# Patient Record
Sex: Male | Born: 1935 | Race: Black or African American | Hispanic: No | Marital: Married | Smoking: Never smoker
Health system: Southern US, Community
[De-identification: ages and names within clinical notes are randomized; demographics above are authoritative.]

## PROBLEM LIST (undated history)

## (undated) DIAGNOSIS — Z8781 Personal history of (healed) traumatic fracture: Secondary | ICD-10-CM

## (undated) DIAGNOSIS — H9190 Unspecified hearing loss, unspecified ear: Secondary | ICD-10-CM

## (undated) DIAGNOSIS — I10 Essential (primary) hypertension: Secondary | ICD-10-CM

## (undated) DIAGNOSIS — E119 Type 2 diabetes mellitus without complications: Secondary | ICD-10-CM

## (undated) DIAGNOSIS — C61 Malignant neoplasm of prostate: Secondary | ICD-10-CM

## (undated) DIAGNOSIS — E785 Hyperlipidemia, unspecified: Secondary | ICD-10-CM

## (undated) HISTORY — DX: Type 2 diabetes mellitus without complications: E11.9

## (undated) HISTORY — DX: Hyperlipidemia, unspecified: E78.5

## (undated) HISTORY — DX: Essential (primary) hypertension: I10

---

## 2009-04-25 HISTORY — PX: COLONOSCOPY: SHX174

## 2010-03-29 ENCOUNTER — Ambulatory Visit: Payer: Self-pay | Admitting: Internal Medicine

## 2010-11-02 ENCOUNTER — Ambulatory Visit: Payer: Self-pay | Admitting: Family Medicine

## 2010-11-23 ENCOUNTER — Ambulatory Visit: Payer: Self-pay | Admitting: Gastroenterology

## 2010-11-24 LAB — PATHOLOGY REPORT

## 2013-09-17 ENCOUNTER — Ambulatory Visit: Payer: Self-pay | Admitting: Physician Assistant

## 2013-09-17 LAB — URINALYSIS, COMPLETE
Bacteria: NEGATIVE
Bilirubin,UR: NEGATIVE
Blood: NEGATIVE
Glucose,UR: NEGATIVE mg/dL (ref 0–75)
Ketone: NEGATIVE
Leukocyte Esterase: NEGATIVE
Nitrite: NEGATIVE
Ph: 6 (ref 4.5–8.0)
Protein: NEGATIVE
RBC,UR: NONE SEEN /HPF (ref 0–5)
Specific Gravity: 1.02 (ref 1.003–1.030)

## 2013-10-29 ENCOUNTER — Ambulatory Visit: Payer: Self-pay | Admitting: Emergency Medicine

## 2014-09-24 ENCOUNTER — Other Ambulatory Visit: Payer: Self-pay | Admitting: Family Medicine

## 2014-10-23 ENCOUNTER — Other Ambulatory Visit: Payer: Self-pay | Admitting: Family Medicine

## 2014-10-23 DIAGNOSIS — E119 Type 2 diabetes mellitus without complications: Secondary | ICD-10-CM

## 2014-10-23 DIAGNOSIS — E785 Hyperlipidemia, unspecified: Secondary | ICD-10-CM

## 2014-10-23 DIAGNOSIS — I10 Essential (primary) hypertension: Secondary | ICD-10-CM

## 2014-10-24 ENCOUNTER — Other Ambulatory Visit: Payer: Self-pay | Admitting: Family Medicine

## 2014-10-24 DIAGNOSIS — I10 Essential (primary) hypertension: Secondary | ICD-10-CM

## 2014-10-30 ENCOUNTER — Encounter: Payer: Self-pay | Admitting: Family Medicine

## 2014-10-30 ENCOUNTER — Ambulatory Visit (INDEPENDENT_AMBULATORY_CARE_PROVIDER_SITE_OTHER): Payer: Commercial Managed Care - HMO | Admitting: Family Medicine

## 2014-10-30 VITALS — BP 130/88 | HR 70 | Ht 74.0 in | Wt 203.0 lb

## 2014-10-30 DIAGNOSIS — I10 Essential (primary) hypertension: Secondary | ICD-10-CM | POA: Diagnosis not present

## 2014-10-30 DIAGNOSIS — Z008 Encounter for other general examination: Secondary | ICD-10-CM

## 2014-10-30 DIAGNOSIS — E119 Type 2 diabetes mellitus without complications: Secondary | ICD-10-CM | POA: Diagnosis not present

## 2014-10-30 DIAGNOSIS — E785 Hyperlipidemia, unspecified: Secondary | ICD-10-CM

## 2014-10-30 LAB — HEMOCCULT GUIAC POC 1CARD (OFFICE): Fecal Occult Blood, POC: NEGATIVE

## 2014-10-30 LAB — POCT URINALYSIS DIPSTICK
Bilirubin, UA: NEGATIVE
Blood, UA: NEGATIVE
Glucose, UA: NEGATIVE
Ketones, UA: NEGATIVE
Leukocytes, UA: NEGATIVE
Nitrite, UA: NEGATIVE
Protein, UA: NEGATIVE
Spec Grav, UA: 1.01
Urobilinogen, UA: 0.2
pH, UA: 6

## 2014-10-30 MED ORDER — LISINOPRIL 2.5 MG PO TABS: ORAL_TABLET | ORAL | Status: DC

## 2014-10-30 MED ORDER — HYDROCHLOROTHIAZIDE 25 MG PO TABS: ORAL_TABLET | ORAL | Status: DC

## 2014-10-30 MED ORDER — SIMVASTATIN 20 MG PO TABS: ORAL_TABLET | ORAL | Status: DC

## 2014-10-30 MED ORDER — METFORMIN HCL 500 MG PO TABS: ORAL_TABLET | ORAL | Status: DC

## 2014-10-30 NOTE — Progress Notes (Signed)
Name: Brandon Dillon.   MRN: [id number]    DOB: 1927/08/02   Date:10/30/2014       Progress Note  Subjective  Chief Complaint  Chief Complaint  Patient presents with  . Hypertension  . Hyperlipidemia  . Diabetes    Hypertension This is a chronic problem. The current episode started more than 1 year ago. The problem is unchanged. The problem is controlled. Pertinent negatives include no anxiety, blurred vision, chest pain, headaches, malaise/fatigue, neck pain, orthopnea, palpitations, peripheral edema, PND, shortness of breath or sweats. Risk factors for coronary artery disease include diabetes mellitus, dyslipidemia and male gender. Past treatments include ACE inhibitors and diuretics. The current treatment provides moderate improvement. There are no compliance problems.  There is no history of angina, kidney disease, CAD/MI, CVA, heart failure, left ventricular hypertrophy, PVD or retinopathy. There is no history of chronic renal disease.  Hyperlipidemia This is a chronic problem. The current episode started more than 1 year ago. The problem is controlled. Recent lipid tests were reviewed and are normal. He has no history of chronic renal disease or obesity. Pertinent negatives include no chest pain, focal sensory loss, focal weakness, leg pain, myalgias or shortness of breath. Current antihyperlipidemic treatment includes statins. The current treatment provides moderate improvement of lipids. Risk factors for coronary artery disease include dyslipidemia, diabetes mellitus, hypertension and male sex.  Diabetes He presents for his follow-up diabetic visit. He has type 2 diabetes mellitus. Pertinent negatives for hypoglycemia include no dizziness, headaches, mood changes, nervousness/anxiousness, pallor, sleepiness, sweats or tremors. Pertinent negatives for diabetes include no blurred vision, no chest pain, no fatigue, no foot paresthesias, no foot ulcerations, no polydipsia, no polyphagia, no  polyuria and no weight loss. There are no hypoglycemic complications. There are no diabetic complications. Pertinent negatives for diabetic complications include no CVA, PVD or retinopathy. There are no known risk factors for coronary artery disease. When asked about current treatments, none were reported. He is compliant with treatment all of the time. He is following a generally healthy diet. He participates in exercise daily. He monitors blood glucose at home 1-2 x per day. There is no change in his home blood glucose trend. His breakfast blood glucose is taken between 7-8 am. His breakfast blood glucose range is generally 70-90 mg/dl. An ACE inhibitor/angiotensin II receptor blocker is being taken. He does not see a podiatrist.Eye exam is not current.    No problem-specific assessment & plan notes found for this encounter.   Past Medical History  Diagnosis Date  . Diabetes mellitus without complication   . Hyperlipidemia   . Hypertension     Past Surgical History  Procedure Laterality Date  . Colonoscopy  2011    normal- Dr?    History reviewed. No pertinent family history.  History   Social History  . Marital Status: Married    Spouse Name: N/A  . Number of Children: N/A  . Years of Education: N/A   Occupational History  . Not on file.   Social History Main Topics  . Smoking status: Never Smoker   . Smokeless tobacco: Not on file  . Alcohol Use: No  . Drug Use: No  . Sexual Activity: Yes   Other Topics Concern  . Not on file   Social History Narrative  . No narrative on file    No Known Allergies   Review of Systems  Constitutional: Negative for fever, chills, weight loss, malaise/fatigue and fatigue.  HENT: Negative for ear discharge,  ear pain and sore throat.   Eyes: Negative for blurred vision.  Respiratory: Negative for cough, sputum production, shortness of breath and wheezing.   Cardiovascular: Negative for chest pain, palpitations, orthopnea, leg  swelling and PND.  Gastrointestinal: Negative for heartburn, nausea, abdominal pain, diarrhea, constipation, blood in stool and melena.  Genitourinary: Negative for dysuria, urgency, frequency and hematuria.  Musculoskeletal: Negative for myalgias, back pain, joint pain and neck pain.  Skin: Negative for pallor and rash.  Neurological: Negative for dizziness, tingling, tremors, sensory change, focal weakness and headaches.  Endo/Heme/Allergies: Negative for environmental allergies, polydipsia and polyphagia. Does not bruise/bleed easily.  Psychiatric/Behavioral: Negative for depression and suicidal ideas. The patient is not nervous/anxious and does not have insomnia.      Objective  Filed Vitals:   10/30/14 1008  BP: 130/88  Pulse: 70  Height: 6\' 2"  (1.88 m)  Weight: 203 lb (92.08 kg)    Physical Exam  Constitutional: He is oriented to person, place, and time and well-developed, well-nourished, and in no distress.  HENT:  Head: Normocephalic.  Right Ear: External ear normal.  Left Ear: External ear normal.  Nose: Nose normal.  Mouth/Throat: Oropharynx is clear and moist.  Eyes: Conjunctivae and EOM are normal. Pupils are equal, round, and reactive to light. Right eye exhibits no discharge. Left eye exhibits no discharge. No scleral icterus.  Neck: Normal range of motion. Neck supple. No JVD present. No tracheal deviation present. No thyromegaly present.  Cardiovascular: Normal rate, regular rhythm, normal heart sounds and intact distal pulses.  Exam reveals no gallop and no friction rub.   No murmur heard. Pulmonary/Chest: Breath sounds normal. No respiratory distress. He has no wheezes. He has no rales.  Abdominal: Soft. Bowel sounds are normal. He exhibits no mass. There is no hepatosplenomegaly. There is no tenderness. There is no rebound, no guarding and no CVA tenderness.  Genitourinary: Rectum normal, prostate normal and penis normal. Guaiac negative stool.  Musculoskeletal:  Normal range of motion. He exhibits no edema or tenderness.  Lymphadenopathy:    He has no cervical adenopathy.  Neurological: He is alert and oriented to person, place, and time. He has normal sensation, normal strength, normal reflexes and intact cranial nerves. No cranial nerve deficit.  Skin: Skin is warm. No rash noted.  Psychiatric: Mood and affect normal.      Assessment & Plan  Problem List Items Addressed This Visit    None    Visit Diagnoses    Essential hypertension    -  Primary    Relevant Medications    aspirin 81 MG tablet    hydrochlorothiazide (HYDRODIURIL) 25 MG tablet    lisinopril (PRINIVIL,ZESTRIL) 2.5 MG tablet    simvastatin (ZOCOR) 20 MG tablet    Other Relevant Orders    Renal Function Panel    POCT Urinalysis Dipstick    Type 2 diabetes mellitus without complication        Relevant Medications    aspirin 81 MG tablet    lisinopril (PRINIVIL,ZESTRIL) 2.5 MG tablet    metFORMIN (GLUCOPHAGE) 500 MG tablet    simvastatin (ZOCOR) 20 MG tablet    Other Relevant Orders    HgB A1c    Hyperlipidemia        Relevant Medications    aspirin 81 MG tablet    hydrochlorothiazide (HYDRODIURIL) 25 MG tablet    lisinopril (PRINIVIL,ZESTRIL) 2.5 MG tablet    simvastatin (ZOCOR) 20 MG tablet    Other Relevant Orders  Lipid Profile    Rectal exam        Relevant Orders    PSA    POCT Occult Blood Stool         Dr. Otilio Miu [facility]  10/30/2014

## 2014-10-31 ENCOUNTER — Other Ambulatory Visit: Payer: Self-pay

## 2014-10-31 DIAGNOSIS — R972 Elevated prostate specific antigen [PSA]: Secondary | ICD-10-CM

## 2014-10-31 LAB — RENAL FUNCTION PANEL
Albumin: 4.3 g/dL (ref 3.5–4.7)
BUN/Creatinine Ratio: 13 (ref 10–22)
BUN: 14 mg/dL (ref 8–27)
CO2: 29 mmol/L (ref 18–29)
Calcium: 9.4 mg/dL (ref 8.6–10.2)
Chloride: 96 mmol/L — ABNORMAL LOW (ref 97–108)
Creatinine, Ser: 1.04 mg/dL (ref 0.76–1.27)
GFR calc Af Amer: 76 mL/min/{1.73_m2} (ref >59)
GFR calc non Af Amer: 66 mL/min/{1.73_m2} (ref >59)
Glucose: 73 mg/dL (ref 65–99)
Phosphorus: 3.4 mg/dL (ref 2.5–4.5)
Potassium: 4.1 mmol/L (ref 3.5–5.2)
Sodium: 139 mmol/L (ref 134–144)

## 2014-10-31 LAB — LIPID PANEL
Chol/HDL Ratio: 3 ratio units (ref 0.0–5.0)
Cholesterol, Total: 192 mg/dL (ref 100–199)
HDL: 63 mg/dL (ref >39)
LDL Calculated: 116 mg/dL — ABNORMAL HIGH (ref 0–99)
Triglycerides: 66 mg/dL (ref 0–149)
VLDL Cholesterol Cal: 13 mg/dL (ref 5–40)

## 2014-10-31 LAB — HEMOGLOBIN A1C
Est. average glucose Bld gHb Est-mCnc: 108 mg/dL
Hgb A1c MFr Bld: 5.4 % (ref 4.8–5.6)

## 2014-10-31 LAB — PSA: Prostate Specific Ag, Serum: 6.6 ng/mL — ABNORMAL HIGH (ref 0.0–4.0)

## 2014-11-05 ENCOUNTER — Other Ambulatory Visit: Payer: Self-pay | Admitting: Family Medicine

## 2014-11-05 DIAGNOSIS — E119 Type 2 diabetes mellitus without complications: Secondary | ICD-10-CM

## 2014-12-04 DIAGNOSIS — R972 Elevated prostate specific antigen [PSA]: Secondary | ICD-10-CM | POA: Insufficient documentation

## 2014-12-07 DIAGNOSIS — N401 Enlarged prostate with lower urinary tract symptoms: Secondary | ICD-10-CM

## 2014-12-07 DIAGNOSIS — N138 Other obstructive and reflux uropathy: Secondary | ICD-10-CM | POA: Insufficient documentation

## 2015-01-27 ENCOUNTER — Other Ambulatory Visit: Payer: Self-pay | Admitting: Family Medicine

## 2015-01-28 ENCOUNTER — Other Ambulatory Visit: Payer: Self-pay | Admitting: Family Medicine

## 2015-01-28 ENCOUNTER — Other Ambulatory Visit: Payer: Self-pay

## 2015-01-28 DIAGNOSIS — E119 Type 2 diabetes mellitus without complications: Secondary | ICD-10-CM

## 2015-01-28 DIAGNOSIS — Z794 Long term (current) use of insulin: Secondary | ICD-10-CM

## 2015-03-30 ENCOUNTER — Ambulatory Visit (INDEPENDENT_AMBULATORY_CARE_PROVIDER_SITE_OTHER): Payer: Commercial Managed Care - HMO | Admitting: Family Medicine

## 2015-03-30 ENCOUNTER — Encounter: Payer: Self-pay | Admitting: Family Medicine

## 2015-03-30 VITALS — BP 130/74 | HR 80 | Ht 73.0 in | Wt 210.0 lb

## 2015-03-30 DIAGNOSIS — I10 Essential (primary) hypertension: Secondary | ICD-10-CM | POA: Diagnosis not present

## 2015-03-30 DIAGNOSIS — T50905A Adverse effect of unspecified drugs, medicaments and biological substances, initial encounter: Secondary | ICD-10-CM

## 2015-03-30 MED ORDER — HYDROCHLOROTHIAZIDE 25 MG PO TABS: ORAL_TABLET | ORAL | Status: DC

## 2015-03-30 NOTE — Progress Notes (Signed)
Name: Brandon Dillon.   MRN: [id number]    DOB: [birth year]   Date:03/30/2015       Progress Note  Subjective  Chief Complaint  Chief Complaint  Patient presents with  . Hypertension    stopped the Lisinopril due to cough- cough has been relieved by stopping the med    Hypertension This is a chronic problem. The current episode started more than 1 year ago. The problem is unchanged. The problem is controlled. Pertinent negatives include no anxiety, blurred vision, chest pain, headaches, malaise/fatigue, neck pain, orthopnea, palpitations, peripheral edema, PND, shortness of breath or sweats. There are no associated agents to hypertension. There are no known risk factors for coronary artery disease. Past treatments include ACE inhibitors and diuretics. The current treatment provides mild improvement. Compliance problems include medication side effects.  There is no history of angina, kidney disease, CAD/MI, CVA, heart failure, left ventricular hypertrophy, PVD, renovascular disease or retinopathy. There is no history of chronic renal disease or a hypertension causing med.    No problem-specific assessment & plan notes found for this encounter.   Past Medical History  Diagnosis Date  . Diabetes mellitus without complication ([place])   . Hyperlipidemia   . Hypertension     Past Surgical History  Procedure Laterality Date  . Colonoscopy  2011    normal- Dr?    History reviewed. No pertinent family history.  Social History   Social History  . Marital Status: Married    Spouse Name: N/A  . Number of Children: N/A  . Years of Education: N/A   Occupational History  . Not on file.   Social History Main Topics  . Smoking status: Never Smoker   . Smokeless tobacco: Not on file  . Alcohol Use: No  . Drug Use: No  . Sexual Activity: Yes   Other Topics Concern  . Not on file   Social History Narrative    Allergies  Allergen Reactions  . Lisinopril     coughing      Review of Systems  Constitutional: Negative for fever, chills, weight loss and malaise/fatigue.  HENT: Negative for ear discharge, ear pain and sore throat.   Eyes: Negative for blurred vision.  Respiratory: Negative for cough, sputum production, shortness of breath and wheezing.   Cardiovascular: Negative for chest pain, palpitations, orthopnea, leg swelling and PND.  Gastrointestinal: Negative for heartburn, nausea, abdominal pain, diarrhea, constipation, blood in stool and melena.  Genitourinary: Negative for dysuria, urgency, frequency and hematuria.  Musculoskeletal: Negative for myalgias, back pain, joint pain and neck pain.  Skin: Negative for rash.  Neurological: Negative for dizziness, tingling, sensory change, focal weakness and headaches.  Endo/Heme/Allergies: Negative for environmental allergies and polydipsia. Does not bruise/bleed easily.  Psychiatric/Behavioral: Negative for depression and suicidal ideas. The patient is not nervous/anxious and does not have insomnia.      Objective  Filed Vitals:   03/30/15 1027  BP: 130/74  Pulse: 80  Height: 6\' 1"  (1.854 m)  Weight: 210 lb (95.255 kg)    Physical Exam  Constitutional: He is oriented to person, place, and time and well-developed, well-nourished, and in no distress.  HENT:  Head: Normocephalic.  Right Ear: External ear normal.  Left Ear: External ear normal.  Nose: Nose normal.  Mouth/Throat: Oropharynx is clear and moist.  Eyes: Conjunctivae and EOM are normal. Pupils are equal, round, and reactive to light. Right eye exhibits no discharge. Left eye exhibits no discharge. No scleral icterus.  Neck:  Normal range of motion. Neck supple. No JVD present. No tracheal deviation present. No thyromegaly present.  Cardiovascular: Normal rate, regular rhythm, normal heart sounds and intact distal pulses.  Exam reveals no gallop and no friction rub.   No murmur heard. Pulmonary/Chest: Breath sounds normal. No  respiratory distress. He has no wheezes. He has no rales.  Abdominal: Soft. Bowel sounds are normal. He exhibits no mass. There is no hepatosplenomegaly. There is no tenderness. There is no rebound, no guarding and no CVA tenderness.  Musculoskeletal: Normal range of motion. He exhibits no edema or tenderness.  Lymphadenopathy:    He has no cervical adenopathy.  Neurological: He is alert and oriented to person, place, and time. He has normal sensation, normal strength, normal reflexes and intact cranial nerves. No cranial nerve deficit.  Skin: Skin is warm. No rash noted.  Psychiatric: Mood and affect normal.  Nursing note and vitals reviewed.     Assessment & Plan  Problem List Items Addressed This Visit    None    Visit Diagnoses    Essential hypertension    -  Primary    Relevant Medications    hydrochlorothiazide (HYDRODIURIL) 25 MG tablet    Medication side effect, initial encounter             Dr. Mac[facility]  03/30/2015

## 2015-05-11 ENCOUNTER — Encounter: Payer: Self-pay | Admitting: Family Medicine

## 2015-05-11 ENCOUNTER — Ambulatory Visit (INDEPENDENT_AMBULATORY_CARE_PROVIDER_SITE_OTHER): Payer: PPO | Admitting: Family Medicine

## 2015-05-11 VITALS — BP 130/62 | HR 72 | Ht 73.0 in | Wt 216.0 lb

## 2015-05-11 DIAGNOSIS — R69 Illness, unspecified: Secondary | ICD-10-CM | POA: Diagnosis not present

## 2015-05-11 DIAGNOSIS — C61 Malignant neoplasm of prostate: Secondary | ICD-10-CM | POA: Insufficient documentation

## 2015-05-11 DIAGNOSIS — N400 Enlarged prostate without lower urinary tract symptoms: Secondary | ICD-10-CM

## 2015-05-11 DIAGNOSIS — E785 Hyperlipidemia, unspecified: Secondary | ICD-10-CM | POA: Insufficient documentation

## 2015-05-11 DIAGNOSIS — I10 Essential (primary) hypertension: Secondary | ICD-10-CM

## 2015-05-11 DIAGNOSIS — E119 Type 2 diabetes mellitus without complications: Secondary | ICD-10-CM | POA: Diagnosis not present

## 2015-05-11 MED ORDER — TAMSULOSIN HCL 0.4 MG PO CAPS: 0.4000 mg | ORAL_CAPSULE | Freq: Every day | ORAL | Status: DC

## 2015-05-11 MED ORDER — METFORMIN HCL 500 MG PO TABS: ORAL_TABLET | ORAL | Status: DC

## 2015-05-11 MED ORDER — HYDROCHLOROTHIAZIDE 25 MG PO TABS: ORAL_TABLET | ORAL | Status: DC

## 2015-05-11 MED ORDER — SIMVASTATIN 20 MG PO TABS: ORAL_TABLET | ORAL | Status: DC

## 2015-05-11 NOTE — Progress Notes (Signed)
Name: Brandon Dillon.   MRN: [id number]    DOB: December 10, 1927   Date:05/11/2015       Progress Note  Subjective  Chief Complaint  Chief Complaint  Patient presents with  . Hyperlipidemia  . Hypertension  . Diabetes  . Benign Prostatic Hypertrophy    Hyperlipidemia This is a chronic problem. The current episode started more than 1 year ago. The problem is controlled. Recent lipid tests were reviewed and are normal. He has no history of chronic renal disease, diabetes, hypothyroidism, liver disease, obesity or nephrotic syndrome. Factors aggravating his hyperlipidemia include fatty foods. Pertinent negatives include no chest pain, focal sensory loss, focal weakness, leg pain, myalgias or shortness of breath. Current antihyperlipidemic treatment includes statins. The current treatment provides moderate improvement of lipids. There are no compliance problems.  Risk factors for coronary artery disease include dyslipidemia.  Hypertension This is a chronic problem. The current episode started more than 1 year ago. The problem has been gradually improving since onset. The problem is controlled. Pertinent negatives include no blurred vision, chest pain, headaches, malaise/fatigue, neck pain, orthopnea, palpitations, PND or shortness of breath. There are no associated agents to hypertension. There are no known risk factors for coronary artery disease. Past treatments include diuretics. The current treatment provides mild improvement. There are no compliance problems.  There is no history of angina, CVA, heart failure, PVD or renovascular disease. There is no history of chronic renal disease or a hypertension causing med.  Diabetes He presents for his follow-up diabetic visit. He has type 2 diabetes mellitus. His disease course has been stable. There are no hypoglycemic associated symptoms. Pertinent negatives for hypoglycemia include no dizziness, headaches or nervousness/anxiousness. Pertinent negatives for  diabetes include no blurred vision, no chest pain, no polydipsia, no polyphagia, no polyuria, no visual change and no weight loss. There are no hypoglycemic complications. Symptoms are stable. Pertinent negatives for diabetic complications include no CVA or PVD. Risk factors for coronary artery disease include diabetes mellitus, dyslipidemia, hypertension and male sex. Current diabetic treatment includes oral agent (monotherapy). An ACE inhibitor/angiotensin II receptor blocker is not being taken. He does not see a podiatrist.Eye exam is not current.  Benign Prostatic Hypertrophy This is a chronic problem. The current episode started more than 1 month ago. The problem has been gradually improving since onset. Irritative symptoms include nocturia. Irritative symptoms do not include frequency or urgency. Obstructive symptoms do not include a weak stream. Pertinent negatives include no chills, dysuria, hematuria or nausea. Past treatments include doxazosin. The treatment provided mild relief.    No problem-specific assessment & plan notes found for this encounter.   Past Medical History  Diagnosis Date  . Diabetes mellitus without complication ([place])   . Hyperlipidemia   . Hypertension     Past Surgical History  Procedure Laterality Date  . Colonoscopy  2011    normal- Dr?    History reviewed. No pertinent family history.  Social History   Social History  . Marital Status: Married    Spouse Name: N/A  . Number of Children: N/A  . Years of Education: N/A   Occupational History  . Not on file.   Social History Main Topics  . Smoking status: Never Smoker   . Smokeless tobacco: Not on file  . Alcohol Use: No  . Drug Use: No  . Sexual Activity: Yes   Other Topics Concern  . Not on file   Social History Narrative    Allergies  Allergen  Reactions  . Lisinopril     coughing     Review of Systems  Constitutional: Negative for fever, chills, weight loss and malaise/fatigue.   HENT: Negative for ear discharge, ear pain and sore throat.   Eyes: Negative for blurred vision.  Respiratory: Negative for cough, sputum production, shortness of breath and wheezing.   Cardiovascular: Negative for chest pain, palpitations, orthopnea, leg swelling and PND.  Gastrointestinal: Negative for heartburn, nausea, abdominal pain, diarrhea, constipation, blood in stool and melena.  Genitourinary: Positive for nocturia. Negative for dysuria, urgency, frequency and hematuria.  Musculoskeletal: Negative for myalgias, back pain, joint pain and neck pain.  Skin: Negative for rash.  Neurological: Negative for dizziness, tingling, sensory change, focal weakness and headaches.  Endo/Heme/Allergies: Negative for environmental allergies, polydipsia and polyphagia. Does not bruise/bleed easily.  Psychiatric/Behavioral: Negative for depression and suicidal ideas. The patient is not nervous/anxious and does not have insomnia.      Objective  Filed Vitals:   05/11/15 0848  BP: 130/62  Pulse: 72  Height: 6\' 1"  (1.854 m)  Weight: 216 lb (97.977 kg)    Physical Exam  Constitutional: He is oriented to person, place, and time and well-developed, well-nourished, and in no distress.  HENT:  Head: Normocephalic.  Right Ear: External ear normal.  Left Ear: External ear normal.  Nose: Nose normal.  Mouth/Throat: Oropharynx is clear and moist.  Eyes: Conjunctivae and EOM are normal. Pupils are equal, round, and reactive to light. Right eye exhibits no discharge. Left eye exhibits no discharge. No scleral icterus.  Neck: Normal range of motion. Neck supple. No JVD present. No tracheal deviation present. No thyromegaly present.  Cardiovascular: Normal rate, regular rhythm, normal heart sounds and intact distal pulses.  Exam reveals no gallop and no friction rub.   No murmur heard. Pulmonary/Chest: Breath sounds normal. No respiratory distress. He has no wheezes. He has no rales.  Abdominal: Soft.  Bowel sounds are normal. He exhibits no mass. There is no hepatosplenomegaly. There is no tenderness. There is no rebound, no guarding and no CVA tenderness.  Genitourinary: Rectum normal and testes/scrotum normal. Prostate is enlarged.  Musculoskeletal: Normal range of motion. He exhibits no edema or tenderness.  Lymphadenopathy:    He has no cervical adenopathy.  Neurological: He is alert and oriented to person, place, and time. He has normal sensation, normal strength, normal reflexes and intact cranial nerves. No cranial nerve deficit.  Skin: Skin is warm. No rash noted.  Psychiatric: Mood and affect normal.  Nursing note and vitals reviewed.     Assessment & Plan  Problem List Items Addressed This Visit      Cardiovascular and Mediastinum   Essential hypertension - Primary   Relevant Medications   hydrochlorothiazide (HYDRODIURIL) 25 MG tablet   simvastatin (ZOCOR) 20 MG tablet   Other Relevant Orders   Renal Function Panel     Endocrine   Type 2 diabetes mellitus without complication, without long-term current use of insulin ([facility])   Relevant Medications   metFORMIN (GLUCOPHAGE) 500 MG tablet   simvastatin (ZOCOR) 20 MG tablet   Other Relevant Orders   HgB A1c     Genitourinary   Primary prostate adenocarcinoma ([facility])   Prostate hypertrophy   Relevant Medications   tamsulosin (FLOMAX) 0.4 MG CAPS capsule     Other   Hyperlipidemia   Relevant Medications   hydrochlorothiazide (HYDRODIURIL) 25 MG tablet   simvastatin (ZOCOR) 20 MG tablet   Other Relevant Orders   Lipid  Profile   Taking multiple medications for chronic disease   Relevant Orders   Hepatic function panel        Dr. Otilio Miu [facility] [place]  05/11/2015

## 2015-05-12 LAB — RENAL FUNCTION PANEL
Albumin: 4.2 g/dL (ref 3.5–4.7)
BUN/Creatinine Ratio: 15 (ref 10–22)
BUN: 18 mg/dL (ref 8–27)
CO2: 27 mmol/L (ref 18–29)
Calcium: 9.1 mg/dL (ref 8.6–10.2)
Chloride: 99 mmol/L (ref 96–106)
Creatinine, Ser: 1.19 mg/dL (ref 0.76–1.27)
GFR calc Af Amer: 64 mL/min/{1.73_m2} (ref >59)
GFR calc non Af Amer: 55 mL/min/{1.73_m2} — ABNORMAL LOW (ref >59)
Glucose: 88 mg/dL (ref 65–99)
Phosphorus: 3.2 mg/dL (ref 2.5–4.5)
Potassium: 4.3 mmol/L (ref 3.5–5.2)
Sodium: 141 mmol/L (ref 134–144)

## 2015-05-12 LAB — HEPATIC FUNCTION PANEL
ALT: 7 IU/L (ref 0–44)
AST: 9 IU/L (ref 0–40)
Alkaline Phosphatase: 101 IU/L (ref 39–117)
Bilirubin Total: 0.6 mg/dL (ref 0.0–1.2)
Bilirubin, Direct: 0.19 mg/dL (ref 0.00–0.40)
Total Protein: 7.4 g/dL (ref 6.0–8.5)

## 2015-05-12 LAB — LIPID PANEL
Chol/HDL Ratio: 3.3 ratio units (ref 0.0–5.0)
Cholesterol, Total: 188 mg/dL (ref 100–199)
HDL: 57 mg/dL (ref >39)
LDL Calculated: 113 mg/dL — ABNORMAL HIGH (ref 0–99)
Triglycerides: 90 mg/dL (ref 0–149)
VLDL Cholesterol Cal: 18 mg/dL (ref 5–40)

## 2015-05-12 LAB — HEMOGLOBIN A1C
Est. average glucose Bld gHb Est-mCnc: 105 mg/dL
Hgb A1c MFr Bld: 5.3 % (ref 4.8–5.6)

## 2015-07-03 ENCOUNTER — Other Ambulatory Visit: Payer: Self-pay | Admitting: Family Medicine

## 2015-07-08 ENCOUNTER — Other Ambulatory Visit: Payer: Self-pay | Admitting: Family Medicine

## 2015-07-10 ENCOUNTER — Other Ambulatory Visit: Payer: Self-pay | Admitting: Family Medicine

## 2015-12-17 ENCOUNTER — Other Ambulatory Visit: Payer: Self-pay | Admitting: Family Medicine

## 2015-12-17 DIAGNOSIS — E785 Hyperlipidemia, unspecified: Secondary | ICD-10-CM

## 2016-01-22 ENCOUNTER — Other Ambulatory Visit: Payer: Self-pay | Admitting: Family Medicine

## 2016-01-22 DIAGNOSIS — E119 Type 2 diabetes mellitus without complications: Secondary | ICD-10-CM

## 2016-01-22 DIAGNOSIS — E785 Hyperlipidemia, unspecified: Secondary | ICD-10-CM

## 2016-02-19 ENCOUNTER — Other Ambulatory Visit: Payer: Self-pay | Admitting: Family Medicine

## 2016-02-19 DIAGNOSIS — E119 Type 2 diabetes mellitus without complications: Secondary | ICD-10-CM

## 2016-03-24 ENCOUNTER — Other Ambulatory Visit: Payer: Self-pay | Admitting: Family Medicine

## 2016-03-24 DIAGNOSIS — E785 Hyperlipidemia, unspecified: Secondary | ICD-10-CM

## 2016-03-24 DIAGNOSIS — I10 Essential (primary) hypertension: Secondary | ICD-10-CM

## 2016-03-24 DIAGNOSIS — E119 Type 2 diabetes mellitus without complications: Secondary | ICD-10-CM

## 2016-04-07 ENCOUNTER — Other Ambulatory Visit: Payer: Self-pay | Admitting: Family Medicine

## 2016-04-07 DIAGNOSIS — I10 Essential (primary) hypertension: Secondary | ICD-10-CM

## 2016-04-21 ENCOUNTER — Other Ambulatory Visit: Payer: Self-pay | Admitting: Family Medicine

## 2016-04-21 DIAGNOSIS — I10 Essential (primary) hypertension: Secondary | ICD-10-CM

## 2016-04-21 DIAGNOSIS — E785 Hyperlipidemia, unspecified: Secondary | ICD-10-CM

## 2016-04-23 ENCOUNTER — Other Ambulatory Visit: Payer: Self-pay | Admitting: Family Medicine

## 2016-04-23 DIAGNOSIS — E119 Type 2 diabetes mellitus without complications: Secondary | ICD-10-CM

## 2016-04-27 ENCOUNTER — Ambulatory Visit (INDEPENDENT_AMBULATORY_CARE_PROVIDER_SITE_OTHER): Payer: Medicare Other | Admitting: Family Medicine

## 2016-04-27 ENCOUNTER — Encounter: Payer: Self-pay | Admitting: Family Medicine

## 2016-04-27 VITALS — BP 140/80 | HR 80 | Ht 73.0 in | Wt 225.0 lb

## 2016-04-27 DIAGNOSIS — I10 Essential (primary) hypertension: Secondary | ICD-10-CM | POA: Diagnosis not present

## 2016-04-27 DIAGNOSIS — E782 Mixed hyperlipidemia: Secondary | ICD-10-CM | POA: Diagnosis not present

## 2016-04-27 DIAGNOSIS — E119 Type 2 diabetes mellitus without complications: Secondary | ICD-10-CM | POA: Diagnosis not present

## 2016-04-27 MED ORDER — SIMVASTATIN 20 MG PO TABS: ORAL_TABLET | ORAL | 6 refills | Status: DC

## 2016-04-27 MED ORDER — METFORMIN HCL 500 MG PO TABS: ORAL_TABLET | ORAL | 6 refills | Status: DC

## 2016-04-27 MED ORDER — HYDROCHLOROTHIAZIDE 25 MG PO TABS: ORAL_TABLET | ORAL | 6 refills | Status: DC

## 2016-04-27 NOTE — Progress Notes (Signed)
Name: Brandon Dillon.   MRN: [id number]    DOB: 11-14-1927   Date:04/27/2016       Progress Note  Subjective  Chief Complaint  Chief Complaint  Patient presents with  . Diabetes  . Hyperlipidemia  . Hypertension    Diabetes  He presents for his follow-up diabetic visit. He has type 2 diabetes mellitus. His disease course has been stable. Pertinent negatives for hypoglycemia include no confusion, dizziness, headaches, hunger, mood changes, nervousness/anxiousness, pallor, seizures, sleepiness, speech difficulty or sweats. Pertinent negatives for diabetes include no blurred vision, no chest pain, no fatigue, no foot paresthesias, no foot ulcerations, no polydipsia, no polyphagia, no polyuria, no visual change, no weakness and no weight loss. There are no hypoglycemic complications. Symptoms are stable. There are no diabetic complications. Pertinent negatives for diabetic complications include no CVA, PVD or retinopathy. Risk factors for coronary artery disease include diabetes mellitus, dyslipidemia, male sex and hypertension. Current diabetic treatment includes oral agent (monotherapy). He is compliant with treatment most of the time. His weight is stable. He is following a generally healthy diet. He has not had a previous visit with a dietitian. He participates in exercise intermittently. His home blood glucose trend is fluctuating minimally. His breakfast blood glucose is taken between 7-8 am. His breakfast blood glucose range is generally 110-130 mg/dl. An ACE inhibitor/angiotensin II receptor blocker is not being taken. He does not see a podiatrist.Eye exam is not current.  Hyperlipidemia  This is a chronic problem. The current episode started more than 1 year ago. The problem is controlled. He has no history of chronic renal disease, diabetes, liver disease or obesity. Factors aggravating his hyperlipidemia include thiazides. Pertinent negatives include no chest pain, focal weakness, myalgias or  shortness of breath. Current antihyperlipidemic treatment includes statins. The current treatment provides moderate improvement of lipids. Risk factors for coronary artery disease include hypertension.  Hypertension  This is a chronic problem. The current episode started more than 1 year ago. The problem has been gradually improving since onset. The problem is controlled. Pertinent negatives include no anxiety, blurred vision, chest pain, headaches, malaise/fatigue, neck pain, orthopnea, palpitations, peripheral edema, PND, shortness of breath or sweats. There are no associated agents to hypertension. Risk factors for coronary artery disease include diabetes mellitus, dyslipidemia and post-menopausal state. Past treatments include diuretics. The current treatment provides moderate improvement. There are no compliance problems.  There is no history of angina, kidney disease, CVA, heart failure, left ventricular hypertrophy, PVD, renovascular disease or retinopathy. There is no history of chronic renal disease or a hypertension causing med.    No problem-specific Assessment & Plan notes found for this encounter.   Past Medical History:  Diagnosis Date  . Diabetes mellitus without complication ([place])   . Hyperlipidemia   . Hypertension     Past Surgical History:  Procedure Laterality Date  . COLONOSCOPY  2011   normal- Dr?    No family history on file.  Social History   Social History  . Marital status: Married    Spouse name: N/A  . Number of children: N/A  . Years of education: N/A   Occupational History  . Not on file.   Social History Main Topics  . Smoking status: Never Smoker  . Smokeless tobacco: Not on file  . Alcohol use No  . Drug use: No  . Sexual activity: Yes   Other Topics Concern  . Not on file   Social History Narrative  . No narrative  on file    Allergies  Allergen Reactions  . Lisinopril     coughing     Review of Systems  Constitutional: Negative  for chills, diaphoresis, fatigue, fever, malaise/fatigue and weight loss.       "hot flashes"  HENT: Negative for congestion, ear discharge, ear pain, sinus pain and sore throat.   Eyes: Negative for blurred vision.  Respiratory: Negative for cough, sputum production, shortness of breath and wheezing.   Cardiovascular: Negative for chest pain, palpitations, orthopnea, leg swelling and PND.  Gastrointestinal: Negative for abdominal pain, blood in stool, constipation, diarrhea, heartburn, melena and nausea.  Genitourinary: Negative for dysuria, frequency, hematuria and urgency.  Musculoskeletal: Negative for back pain, joint pain, myalgias and neck pain.  Skin: Negative for pallor and rash.  Neurological: Negative for dizziness, tingling, sensory change, focal weakness, seizures, speech difficulty, weakness and headaches.  Endo/Heme/Allergies: Negative for environmental allergies, polydipsia and polyphagia. Does not bruise/bleed easily.  Psychiatric/Behavioral: Negative for confusion, depression and suicidal ideas. The patient is not nervous/anxious and does not have insomnia.      Objective  Vitals:   04/27/16 0917  BP: 140/80  Pulse: 80  Weight: 225 lb (102.1 kg)  Height: 6\' 1"  (1.854 m)    Physical Exam  Constitutional: He is oriented to person, place, and time and well-developed, well-nourished, and in no distress.  HENT:  Head: Normocephalic.  Right Ear: Tympanic membrane, external ear and ear canal normal.  Left Ear: Tympanic membrane, external ear and ear canal normal.  Nose: Nose normal.  Mouth/Throat: Uvula is midline and oropharynx is clear and moist.  Eyes: Conjunctivae and EOM are normal. Pupils are equal, round, and reactive to light. Right eye exhibits no discharge. Left eye exhibits no discharge. No scleral icterus.  Fundoscopic exam:      The right eye shows no arteriolar narrowing, no AV nicking and no papilledema.       The left eye shows no arteriolar narrowing,  no AV nicking and no papilledema.  Neck: Trachea normal and normal range of motion. Neck supple. Normal carotid pulses, no hepatojugular reflux and no JVD present. Carotid bruit is not present. No tracheal deviation present. No thyromegaly present.  Cardiovascular: Normal rate, regular rhythm, S1 normal, S2 normal, normal heart sounds, intact distal pulses and normal pulses.  PMI is not displaced.  Exam reveals no gallop, no S3, no S4 and no friction rub.   No murmur heard. Pulmonary/Chest: Breath sounds normal. No respiratory distress. He has no wheezes. He has no rales. Right breast exhibits no mass. Left breast exhibits no mass.  Abdominal: Soft. Bowel sounds are normal. He exhibits no mass. There is no hepatosplenomegaly. There is no tenderness. There is no rebound, no guarding and no CVA tenderness.  Musculoskeletal: Normal range of motion. He exhibits no edema or tenderness.  Lymphadenopathy:       Head (right side): No submental and no submandibular adenopathy present.       Head (left side): No submental and no submandibular adenopathy present.    He has no cervical adenopathy.  Neurological: He is alert and oriented to person, place, and time. He has normal sensation, normal strength, normal reflexes and intact cranial nerves. No cranial nerve deficit.  Skin: Skin is warm and intact. No rash noted.  Psychiatric: Mood and affect normal.  Nursing note and vitals reviewed.     Assessment & Plan  Problem List Items Addressed This Visit      Cardiovascular and Mediastinum  Essential hypertension - Primary   Relevant Medications   hydrochlorothiazide (HYDRODIURIL) 25 MG tablet   simvastatin (ZOCOR) 20 MG tablet   Other Relevant Orders   Renal Function Panel     Endocrine   Type 2 diabetes mellitus without complication, without long-term current use of insulin (HCC)   Relevant Medications   simvastatin (ZOCOR) 20 MG tablet   metFORMIN (GLUCOPHAGE) 500 MG tablet   Other Relevant  Orders   Hemoglobin A1c   Renal Function Panel   Microalbumin / creatinine urine ratio     Other   Hyperlipidemia   Relevant Medications   hydrochlorothiazide (HYDRODIURIL) 25 MG tablet   simvastatin (ZOCOR) 20 MG tablet   Other Relevant Orders   Lipid panel        Dr. Jentzen Minasyan [facility]  04/27/16

## 2016-04-28 LAB — RENAL FUNCTION PANEL
Albumin: 4.1 g/dL (ref 3.5–4.7)
BUN/Creatinine Ratio: 14 (ref 10–24)
BUN: 18 mg/dL (ref 8–27)
CO2: 20 mmol/L (ref 18–29)
Calcium: 9.4 mg/dL (ref 8.6–10.2)
Chloride: 101 mmol/L (ref 96–106)
Creatinine, Ser: 1.26 mg/dL (ref 0.76–1.27)
GFR calc Af Amer: 59 mL/min/{1.73_m2} — ABNORMAL LOW (ref >59)
GFR calc non Af Amer: 51 mL/min/{1.73_m2} — ABNORMAL LOW (ref >59)
Glucose: 77 mg/dL (ref 65–99)
Phosphorus: 3.4 mg/dL (ref 2.5–4.5)
Potassium: 4.2 mmol/L (ref 3.5–5.2)
Sodium: 142 mmol/L (ref 134–144)

## 2016-04-28 LAB — LIPID PANEL
Chol/HDL Ratio: 4.6 ratio units (ref 0.0–5.0)
Cholesterol, Total: 210 mg/dL — ABNORMAL HIGH (ref 100–199)
HDL: 46 mg/dL (ref >39)
LDL Calculated: 139 mg/dL — ABNORMAL HIGH (ref 0–99)
Triglycerides: 123 mg/dL (ref 0–149)
VLDL Cholesterol Cal: 25 mg/dL (ref 5–40)

## 2016-04-28 LAB — HEMOGLOBIN A1C
Est. average glucose Bld gHb Est-mCnc: 105 mg/dL
Hgb A1c MFr Bld: 5.3 % (ref 4.8–5.6)

## 2016-04-28 LAB — MICROALBUMIN / CREATININE URINE RATIO
Creatinine, Urine: 119 mg/dL
Microalb/Creat Ratio: 15.5 mg/g creat (ref 0.0–30.0)
Microalbumin, Urine: 18.5 ug/mL

## 2016-07-01 DIAGNOSIS — H2513 Age-related nuclear cataract, bilateral: Secondary | ICD-10-CM | POA: Diagnosis not present

## 2016-07-05 ENCOUNTER — Other Ambulatory Visit: Payer: Self-pay | Admitting: Family Medicine

## 2016-07-11 DIAGNOSIS — C61 Malignant neoplasm of prostate: Secondary | ICD-10-CM | POA: Diagnosis not present

## 2016-07-14 ENCOUNTER — Other Ambulatory Visit: Payer: Self-pay

## 2016-08-29 ENCOUNTER — Other Ambulatory Visit: Payer: Self-pay | Admitting: Family Medicine

## 2016-08-29 DIAGNOSIS — E782 Mixed hyperlipidemia: Secondary | ICD-10-CM

## 2016-08-29 DIAGNOSIS — E119 Type 2 diabetes mellitus without complications: Secondary | ICD-10-CM

## 2016-11-24 ENCOUNTER — Other Ambulatory Visit: Payer: Self-pay | Admitting: Family Medicine

## 2016-11-24 DIAGNOSIS — I10 Essential (primary) hypertension: Secondary | ICD-10-CM

## 2017-01-12 DIAGNOSIS — C61 Malignant neoplasm of prostate: Secondary | ICD-10-CM | POA: Diagnosis not present

## 2017-02-09 ENCOUNTER — Ambulatory Visit (INDEPENDENT_AMBULATORY_CARE_PROVIDER_SITE_OTHER): Payer: Medicare Other

## 2017-02-09 DIAGNOSIS — Z23 Encounter for immunization: Secondary | ICD-10-CM | POA: Diagnosis not present

## 2017-02-15 ENCOUNTER — Ambulatory Visit (INDEPENDENT_AMBULATORY_CARE_PROVIDER_SITE_OTHER): Payer: Medicare Other | Admitting: Family Medicine

## 2017-02-15 ENCOUNTER — Encounter: Payer: Self-pay | Admitting: Family Medicine

## 2017-02-15 DIAGNOSIS — I10 Essential (primary) hypertension: Secondary | ICD-10-CM | POA: Diagnosis not present

## 2017-02-15 DIAGNOSIS — E782 Mixed hyperlipidemia: Secondary | ICD-10-CM

## 2017-02-15 DIAGNOSIS — E119 Type 2 diabetes mellitus without complications: Secondary | ICD-10-CM | POA: Diagnosis not present

## 2017-02-15 MED ORDER — METFORMIN HCL 500 MG PO TABS: ORAL_TABLET | ORAL | 1 refills | Status: DC

## 2017-02-15 MED ORDER — HYDROCHLOROTHIAZIDE 25 MG PO TABS: ORAL_TABLET | ORAL | 1 refills | Status: DC

## 2017-02-15 MED ORDER — GLUCOSE BLOOD VI STRP: 1.0000 | ORAL_STRIP | Freq: Every day | 1 refills | Status: DC

## 2017-02-15 MED ORDER — SIMVASTATIN 20 MG PO TABS: ORAL_TABLET | ORAL | 1 refills | Status: DC

## 2017-02-15 NOTE — Progress Notes (Signed)
Name: Brandon Dillon.   MRN: [id number]    DOB: 16-Oct-1927   Date:02/15/2017       Progress Note  Subjective  Chief Complaint  Chief Complaint  Patient presents with  . Diabetes  . Hypertension  . Hyperlipidemia    Diabetes  He presents for his follow-up diabetic visit. He has type 2 diabetes mellitus. His disease course has been stable. There are no hypoglycemic associated symptoms. Pertinent negatives for hypoglycemia include no dizziness, headaches or nervousness/anxiousness. There are no diabetic associated symptoms. Pertinent negatives for diabetes include no blurred vision, no chest pain, no polydipsia and no weight loss. There are no hypoglycemic complications. Symptoms are stable. There are no diabetic complications. Pertinent negatives for diabetic complications include no CVA, PVD or retinopathy. There are no known risk factors for coronary artery disease. He is compliant with treatment all of the time. He is following a generally healthy diet. Meal planning includes avoidance of concentrated sweets. He participates in exercise daily. His home blood glucose trend is fluctuating minimally. His breakfast blood glucose is taken between 8-9 am. His breakfast blood glucose range is generally 130-140 mg/dl. An ACE inhibitor/angiotensin II receptor blocker is not being taken. He does not see a podiatrist.Eye exam is current.  Hypertension  This is a chronic problem. The current episode started more than 1 year ago. The problem is controlled. Pertinent negatives include no anxiety, blurred vision, chest pain, headaches, malaise/fatigue, neck pain, palpitations or shortness of breath. Risk factors for coronary artery disease include dyslipidemia and male gender. Past treatments include diuretics. There are no compliance problems.  There is no history of angina, kidney disease, CAD/MI, CVA, heart failure, left ventricular hypertrophy, PVD or retinopathy. There is no history of chronic renal  disease, a hypertension causing med or renovascular disease.  Hyperlipidemia  This is a new problem. The current episode started more than 1 year ago. The problem is controlled. Recent lipid tests were reviewed and are normal. He has no history of chronic renal disease. Pertinent negatives include no chest pain, focal weakness, myalgias or shortness of breath. Current antihyperlipidemic treatment includes statins. The current treatment provides moderate improvement of lipids.  Benign Prostatic Hypertrophy  This is a new problem. The current episode started more than 1 year ago. The problem has been waxing and waning since onset. Irritative symptoms do not include frequency or urgency. Obstructive symptoms do not include an intermittent stream. Pertinent negatives include no chills, dysuria, hematuria or nausea. Past treatments include tamsulosin. The treatment provided moderate relief.    No problem-specific Assessment & Plan notes found for this encounter.   Past Medical History:  Diagnosis Date  . Diabetes mellitus without complication ([place])   . Hyperlipidemia   . Hypertension     Past Surgical History:  Procedure Laterality Date  . COLONOSCOPY  2011   normal- Dr?    No family history on file.  Social History   Social History  . Marital status: Married    Spouse name: N/A  . Number of children: N/A  . Years of education: N/A   Occupational History  . Not on file.   Social History Main Topics  . Smoking status: Never Smoker  . Smokeless tobacco: Never Used  . Alcohol use No  . Drug use: No  . Sexual activity: Yes   Other Topics Concern  . Not on file   Social History Narrative  . No narrative on file    Allergies  Allergen Reactions  .  Lisinopril     coughing    Outpatient Medications Prior to Visit  Medication Sig Dispense Refill  . aspirin 81 MG tablet Take 81 mg by mouth daily.    . Lancets (FREESTYLE) lancets AS DIRECTED 100 each 0  . tamsulosin  (FLOMAX) 0.4 MG CAPS capsule Take 1 capsule (0.4 mg total) by mouth daily. 30 capsule 6  . GE100 BLOOD GLUCOSE TEST test strip TEST BG ONCE DAILY 100 each 0  . hydrochlorothiazide (HYDRODIURIL) 25 MG tablet TAKE ONE (1) TABLET BY MOUTH EVERY DAY 30 tablet 0  . metFORMIN (GLUCOPHAGE) 500 MG tablet TAKE ONE (1) TABLET BY MOUTH EVERY DAY 90 tablet 0  . simvastatin (ZOCOR) 20 MG tablet TAKE ONE (1) TABLET BY MOUTH EVERY DAY 90 tablet 0   No facility-administered medications prior to visit.     Review of Systems  Constitutional: Negative for chills, fever, malaise/fatigue and weight loss.  HENT: Negative for ear discharge, ear pain and sore throat.   Eyes: Negative for blurred vision.  Respiratory: Negative for cough, sputum production, shortness of breath and wheezing.   Cardiovascular: Negative for chest pain, palpitations and leg swelling.  Gastrointestinal: Negative for abdominal pain, blood in stool, constipation, diarrhea, heartburn, melena and nausea.  Genitourinary: Negative for dysuria, frequency, hematuria and urgency.  Musculoskeletal: Negative for back pain, joint pain, myalgias and neck pain.  Skin: Negative for rash.  Neurological: Negative for dizziness, tingling, sensory change, focal weakness and headaches.  Endo/Heme/Allergies: Negative for environmental allergies and polydipsia. Does not bruise/bleed easily.  Psychiatric/Behavioral: Negative for depression and suicidal ideas. The patient is not nervous/anxious and does not have insomnia.      Objective  Vitals:   02/15/17 0920  BP: 140/82  Pulse: 80  Weight: 217 lb (98.4 kg)  Height: 6\' 1"  (1.854 m)    Physical Exam  Constitutional: He is oriented to person, place, and time and well-developed, well-nourished, and in no distress.  HENT:  Head: Normocephalic.  Right Ear: External ear normal.  Left Ear: External ear normal.  Nose: Nose normal.  Mouth/Throat: Oropharynx is clear and moist.  Eyes: Pupils are equal,  round, and reactive to light. Conjunctivae and EOM are normal. Right eye exhibits no discharge. Left eye exhibits no discharge. No scleral icterus.  Neck: Normal range of motion. Neck supple. No JVD present. No tracheal deviation present. No thyromegaly present.  Cardiovascular: Normal rate, regular rhythm, normal heart sounds and intact distal pulses.  Exam reveals no gallop and no friction rub.   No murmur heard. Pulmonary/Chest: Breath sounds normal. No respiratory distress. He has no wheezes. He has no rales.  Abdominal: Soft. Bowel sounds are normal. He exhibits no mass. There is no hepatosplenomegaly. There is no tenderness. There is no rebound, no guarding and no CVA tenderness.  Musculoskeletal: Normal range of motion. He exhibits no edema or tenderness.  Lymphadenopathy:    He has no cervical adenopathy.  Neurological: He is alert and oriented to person, place, and time. He has normal sensation, normal strength, normal reflexes and intact cranial nerves. No cranial nerve deficit.  Skin: Skin is warm. No rash noted.  Psychiatric: Mood and affect normal.  Nursing note and vitals reviewed.     Assessment & Plan  Problem List Items Addressed This Visit      Cardiovascular and Mediastinum   Essential hypertension   Relevant Medications   hydrochlorothiazide (HYDRODIURIL) 25 MG tablet   simvastatin (ZOCOR) 20 MG tablet   Other Relevant Orders  Renal Function Panel     Endocrine   Type 2 diabetes mellitus without complication, without long-term current use of insulin (HCC)   Relevant Medications   simvastatin (ZOCOR) 20 MG tablet   metFORMIN (GLUCOPHAGE) 500 MG tablet   Other Relevant Orders   Microalbumin / creatinine urine ratio   Hemoglobin A1c     Other   Hyperlipidemia   Relevant Medications   hydrochlorothiazide (HYDRODIURIL) 25 MG tablet   simvastatin (ZOCOR) 20 MG tablet   Other Relevant Orders   Lipid Profile      Meds ordered this encounter  Medications   . hydrochlorothiazide (HYDRODIURIL) 25 MG tablet    Sig: TAKE ONE (1) TABLET BY MOUTH EVERY DAY    Dispense:  90 tablet    Refill:  1    sched appt  . simvastatin (ZOCOR) 20 MG tablet    Sig: TAKE ONE (1) TABLET BY MOUTH EVERY DAY    Dispense:  90 tablet    Refill:  1    INSURANCE REQUEST 90 DAY SUPPLY. THANKS!  . metFORMIN (GLUCOPHAGE) 500 MG tablet    Sig: TAKE ONE (1) TABLET BY MOUTH EVERY DAY    Dispense:  90 tablet    Refill:  1    INSURANCE REQUEST 90 DAY SUPPLY. THANKS!  . glucose blood (GE100 BLOOD GLUCOSE TEST) test strip    Sig: 1 each by Other route daily. Use as instructed- Dx E11.9    Dispense:  100 each    Refill:  1    E11.9      Dr. Otilio Miu [facility]  02/15/17

## 2017-02-16 LAB — RENAL FUNCTION PANEL
Albumin: 4.3 g/dL (ref 3.5–4.7)
BUN/Creatinine Ratio: 12 (ref 10–24)
BUN: 12 mg/dL (ref 8–27)
CO2: 25 mmol/L (ref 20–29)
Calcium: 9.6 mg/dL (ref 8.6–10.2)
Chloride: 104 mmol/L (ref 96–106)
Creatinine, Ser: 0.99 mg/dL (ref 0.76–1.27)
GFR calc Af Amer: 79 mL/min/{1.73_m2} (ref >59)
GFR calc non Af Amer: 69 mL/min/{1.73_m2} (ref >59)
Glucose: 106 mg/dL — ABNORMAL HIGH (ref 65–99)
Phosphorus: 3.2 mg/dL (ref 2.5–4.5)
Potassium: 4.2 mmol/L (ref 3.5–5.2)
Sodium: 141 mmol/L (ref 134–144)

## 2017-02-16 LAB — LIPID PANEL
Chol/HDL Ratio: 3 ratio (ref 0.0–5.0)
Cholesterol, Total: 176 mg/dL (ref 100–199)
HDL: 59 mg/dL (ref >39)
LDL Calculated: 99 mg/dL (ref 0–99)
Triglycerides: 88 mg/dL (ref 0–149)
VLDL Cholesterol Cal: 18 mg/dL (ref 5–40)

## 2017-02-16 LAB — MICROALBUMIN / CREATININE URINE RATIO
Creatinine, Urine: 174.9 mg/dL
Microalb/Creat Ratio: 5.1 mg/g creat (ref 0.0–30.0)
Microalbumin, Urine: 9 ug/mL

## 2017-02-16 LAB — HEMOGLOBIN A1C
Est. average glucose Bld gHb Est-mCnc: 97 mg/dL
Hgb A1c MFr Bld: 5 % (ref 4.8–5.6)

## 2017-07-05 ENCOUNTER — Encounter: Payer: Self-pay | Admitting: Urology

## 2017-07-05 ENCOUNTER — Ambulatory Visit (INDEPENDENT_AMBULATORY_CARE_PROVIDER_SITE_OTHER): Payer: Medicare Other | Admitting: Urology

## 2017-07-05 VITALS — BP 138/56 | HR 99 | Ht 74.0 in | Wt 218.0 lb

## 2017-07-05 DIAGNOSIS — C61 Malignant neoplasm of prostate: Secondary | ICD-10-CM

## 2017-07-05 NOTE — Progress Notes (Signed)
07/05/2017 1:39 PM   Brandon Dillon. Oct 29, 1927 [id number]  Referring provider: Juline Patch, MD [address] [place], [place] [address]  Chief Complaint  Patient presents with  . Follow-up    HPI: 84 year old male presents for follow-up of prostate cancer.  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  He was started on Lupron with appropriate decrease in his PSA.  He takes Megace as needed hot flashes.  His last PSA in September 2018 was 0.43 which is his nadir.  His last Lupron injection was 01/12/2017.  He has no voiding complaints.  He denies bony pain.   PMH: Past Medical History:  Diagnosis Date  . Diabetes mellitus without complication ([place])   . Hyperlipidemia   . Hypertension     Surgical History: Past Surgical History:  Procedure Laterality Date  . COLONOSCOPY  2011   normal- Dr?    Home Medications:  Allergies as of 07/05/2017      Reactions   Lisinopril    coughing      Medication List        Accurate as of 07/05/17  1:39 PM. Always use your most recent med list.          aspirin 81 MG tablet Take 81 mg by mouth daily.   freestyle lancets AS DIRECTED   glucose blood test strip Commonly known as:  GE100 BLOOD GLUCOSE TEST 1 each by Other route daily. Use as instructed- Dx E11.9   hydrochlorothiazide 25 MG tablet Commonly known as:  HYDRODIURIL TAKE ONE (1) TABLET BY MOUTH EVERY DAY   megestrol 20 MG tablet Commonly known as:  MEGACE Take 1 tablet by mouth 2 (two) times daily.   metFORMIN 500 MG tablet Commonly known as:  GLUCOPHAGE TAKE ONE (1) TABLET BY MOUTH EVERY DAY   simvastatin 20 MG tablet Commonly known as:  ZOCOR TAKE ONE (1) TABLET BY MOUTH EVERY DAY   tamsulosin 0.4 MG Caps capsule Commonly known as:  FLOMAX Take 1 capsule (0.4 mg total) by mouth daily.       Allergies:  Allergies    Allergen Reactions  . Lisinopril     coughing    Family History: No family history on file.  Social History:  reports that  has never smoked. he has never used smokeless tobacco. He reports that he does not drink alcohol or use drugs.  ROS: UROLOGY Frequent Urination?: Yes Hard to postpone urination?: Yes Burning/pain with urination?: No Get up at night to urinate?: Yes Leakage of urine?: Yes Urine stream starts and stops?: No Trouble starting stream?: No Do you have to strain to urinate?: No Blood in urine?: No Urinary tract infection?: No Sexually transmitted disease?: No Injury to kidneys or bladder?: No Painful intercourse?: No Weak stream?: No Erection problems?: No Penile pain?: No  Gastrointestinal Nausea?: No Vomiting?: No Indigestion/heartburn?: No Diarrhea?: No Constipation?: No  Constitutional Fever: No Night sweats?: No Weight loss?: No Fatigue?: No  Skin Skin rash/lesions?: No Itching?: No  Eyes Blurred vision?: Yes Double vision?: No  Ears/Nose/Throat Sore throat?: No Sinus problems?: No  Hematologic/Lymphatic Swollen glands?: No Easy bruising?: No  Cardiovascular Leg swelling?: No Chest pain?: No  Respiratory Cough?: No Shortness of breath?: No  Endocrine Excessive thirst?: No  Musculoskeletal Back pain?: Yes Joint pain?: No  Neurological Headaches?: No Dizziness?: No  Psychologic Depression?: No Anxiety?: No  Physical Exam: BP (!) 138/56   Pulse 99   Ht 6\' 2"  (1.88 m)   Wt 218 lb (98.9 kg)   BMI 27.99 kg/m   Constitutional:  Alert and oriented, No acute distress. HEENT: [place] AT, moist mucus membranes.  Trachea midline, no masses. Cardiovascular: No clubbing, cyanosis, or edema. Respiratory: Normal respiratory effort, no increased work of breathing. GI: Abdomen is soft, nontender, nondistended, no abdominal masses GU: No CVA tenderness Lymph: No cervical or inguinal lymphadenopathy. Skin: No rashes, bruises  or suspicious lesions. Neurologic: Grossly intact, no focal deficits, moving all 4 extremities. Psychiatric: Normal mood and affect.  Laboratory Data:  Lab Results  Component Value Date   CREATININE 0.99 02/15/2017     Lab Results  Component Value Date   HGBA1C 5.0 02/15/2017     Assessment & Plan:   Clinical T1c N0 M0 adenocarcinoma the prostate doing well on hormonal therapy.  He would be early for a Lupron injection today.  His PSA and testosterone levels were drawn.  We will obtain a follow-up bone scan.    1. Prostate cancer ([place])  - PSA - Testosterone - NM Bone Scan Whole Body; Future    Abbie Sons, [place] [address], [place],  [phone]

## 2017-07-06 LAB — TESTOSTERONE: Testosterone: <3 ng/dL — ABNORMAL LOW (ref 264–916)

## 2017-07-06 LAB — PSA: Prostate Specific Ag, Serum: 0.5 ng/mL (ref 0.0–4.0)

## 2017-07-10 ENCOUNTER — Telehealth: Payer: Self-pay

## 2017-07-10 ENCOUNTER — Encounter: Payer: Self-pay | Admitting: Urology

## 2017-07-10 NOTE — Telephone Encounter (Signed)
-----   Message from Abbie Sons, MD sent at 07/09/2017  7:43 PM EDT ----- PSA looks good at 0.5.  Will await bone scan results before deciding about Lupron injection.

## 2017-07-10 NOTE — Telephone Encounter (Signed)
LMOM

## 2017-07-11 NOTE — Telephone Encounter (Signed)
Pt called and I read message from [place] 

## 2017-07-20 ENCOUNTER — Encounter
Admission: RE | Admit: 2017-07-20 | Discharge: 2017-07-20 | Disposition: A | Discharge status: Home / Self Care | Payer: Medicare Other | Source: Ambulatory Visit | Attending: Urology | Admitting: Urology

## 2017-07-20 DIAGNOSIS — C61 Malignant neoplasm of prostate: Secondary | ICD-10-CM | POA: Insufficient documentation

## 2017-07-20 DIAGNOSIS — Z8781 Personal history of (healed) traumatic fracture: Secondary | ICD-10-CM

## 2017-07-20 HISTORY — DX: Personal history of (healed) traumatic fracture: Z87.81

## 2017-07-20 MED ORDER — TECHNETIUM TC 99M MEDRONATE IV KIT
25.0000 | PACK | Freq: Once | INTRAVENOUS | Status: AC | PRN
  Administered 2017-07-20: 23.107 via INTRAVENOUS

## 2017-08-02 DIAGNOSIS — H2513 Age-related nuclear cataract, bilateral: Secondary | ICD-10-CM | POA: Diagnosis not present

## 2017-08-03 ENCOUNTER — Other Ambulatory Visit: Payer: Self-pay

## 2017-08-03 LAB — HM DIABETES EYE EXAM

## 2017-08-08 ENCOUNTER — Telehealth: Payer: Self-pay

## 2017-08-08 NOTE — Telephone Encounter (Signed)
Spoke with pt in reference to bone scan, lupron, and PSA lab draw. Pt voiced understanding. Nurse visit made for lupron and lab appt made for 80mo.

## 2017-08-08 NOTE — Telephone Encounter (Signed)
-----   Message from Abbie Sons, MD sent at 08/06/2017 12:17 PM EDT ----- Bone scan still with evidence of bone metastasis.  Continue Lupron.  His last Lupron injection was on  9/20.  Please schedule Lupron injection.  Recommend a repeat PSA in 3 months.

## 2017-08-09 ENCOUNTER — Ambulatory Visit: Payer: Self-pay

## 2017-08-10 ENCOUNTER — Ambulatory Visit (INDEPENDENT_AMBULATORY_CARE_PROVIDER_SITE_OTHER): Payer: Medicare Other

## 2017-08-10 VITALS — BP 130/71 | HR 79 | Ht 74.0 in | Wt 216.0 lb

## 2017-08-10 DIAGNOSIS — C61 Malignant neoplasm of prostate: Secondary | ICD-10-CM

## 2017-08-10 MED ORDER — LEUPROLIDE ACETATE (6 MONTH) 45 MG IM KIT
45.0000 mg | PACK | Freq: Once | INTRAMUSCULAR | Status: AC
  Administered 2017-08-10: 45 mg via INTRAMUSCULAR

## 2017-08-10 NOTE — Progress Notes (Signed)
Lupron IM Injection   Due to Prostate Cancer patient is present today for a Lupron Injection.  Medication: Lupron 6 month Dose: 45 mg  Location: left upper outer buttocks Lot: [id number] Exp: 12/10/19  Patient tolerated well, no complications were noted  Performed by: Toniann Fail, LPN   Blood pressure 130/71, pulse 79, height 6\' 2"  (1.88 m), weight 216 lb (98 kg).

## 2017-08-15 ENCOUNTER — Other Ambulatory Visit: Payer: Self-pay | Admitting: Family Medicine

## 2017-08-15 DIAGNOSIS — E782 Mixed hyperlipidemia: Secondary | ICD-10-CM

## 2017-08-15 DIAGNOSIS — E119 Type 2 diabetes mellitus without complications: Secondary | ICD-10-CM

## 2017-08-15 DIAGNOSIS — I10 Essential (primary) hypertension: Secondary | ICD-10-CM

## 2017-08-16 ENCOUNTER — Telehealth: Payer: Self-pay

## 2017-08-16 NOTE — Telephone Encounter (Signed)
[facility] called requesting a refill on pt megace. Did not see mention of medication in your most recent office visit. Please advise.

## 2017-08-17 MED ORDER — MEGESTROL ACETATE 20 MG PO TABS: 20.0000 mg | ORAL_TABLET | Freq: Two times a day (BID) | ORAL | 3 refills | Status: DC | PRN

## 2017-08-17 NOTE — Telephone Encounter (Signed)
Rx was sent  

## 2017-09-15 DIAGNOSIS — H2511 Age-related nuclear cataract, right eye: Secondary | ICD-10-CM | POA: Diagnosis not present

## 2017-09-21 NOTE — Discharge Instructions (Signed)

## 2017-09-27 ENCOUNTER — Ambulatory Visit
Admission: RE | Admit: 2017-09-27 | Discharge: 2017-09-27 | Disposition: A | Discharge status: Home / Self Care | Payer: Medicare Other | Source: Ambulatory Visit | Attending: Ophthalmology | Admitting: Ophthalmology

## 2017-09-27 ENCOUNTER — Ambulatory Visit: Payer: Medicare Other | Admitting: Anesthesiology

## 2017-09-27 ENCOUNTER — Encounter
Admission: RE | Disposition: A | Discharge status: Home / Self Care | Payer: Self-pay | Source: Ambulatory Visit | Attending: Ophthalmology

## 2017-09-27 DIAGNOSIS — I10 Essential (primary) hypertension: Secondary | ICD-10-CM | POA: Diagnosis not present

## 2017-09-27 DIAGNOSIS — E119 Type 2 diabetes mellitus without complications: Secondary | ICD-10-CM | POA: Diagnosis not present

## 2017-09-27 DIAGNOSIS — H2511 Age-related nuclear cataract, right eye: Secondary | ICD-10-CM | POA: Diagnosis not present

## 2017-09-27 DIAGNOSIS — H25811 Combined forms of age-related cataract, right eye: Secondary | ICD-10-CM | POA: Diagnosis not present

## 2017-09-27 HISTORY — DX: Unspecified hearing loss, unspecified ear: H91.90

## 2017-09-27 HISTORY — DX: Malignant neoplasm of prostate: C61

## 2017-09-27 HISTORY — PX: CATARACT EXTRACTION W/PHACO: SHX586

## 2017-09-27 LAB — GLUCOSE, CAPILLARY
Glucose-Capillary: 99 mg/dL (ref 65–99)
Glucose-Capillary: 93 mg/dL (ref 65–99)

## 2017-09-27 SURGERY — PHACOEMULSIFICATION, CATARACT, WITH IOL INSERTION
Anesthesia: Monitor Anesthesia Care | Laterality: Right | Wound class: Clean

## 2017-09-27 MED ORDER — FENTANYL CITRATE (PF) 100 MCG/2ML IJ SOLN
INTRAMUSCULAR | Status: DC | PRN
  Administered 2017-09-27: 50 ug via INTRAVENOUS

## 2017-09-27 MED ORDER — MOXIFLOXACIN HCL 0.5 % OP SOLN
1.0000 [drp] | OPHTHALMIC | Status: DC | PRN
  Administered 2017-09-27 (×3): 1 [drp] via OPHTHALMIC

## 2017-09-27 MED ORDER — BRIMONIDINE TARTRATE-TIMOLOL 0.2-0.5 % OP SOLN
OPHTHALMIC | Status: DC | PRN
  Administered 2017-09-27: 1 [drp] via OPHTHALMIC

## 2017-09-27 MED ORDER — BALANCED SALT IO SOLN
INTRAOCULAR | Status: DC | PRN
  Administered 2017-09-27: 1 mL

## 2017-09-27 MED ORDER — NA HYALUR & NA CHOND-NA HYALUR 0.4-0.35 ML IO KIT
PACK | INTRAOCULAR | Status: DC | PRN
  Administered 2017-09-27: 1 mL via INTRAOCULAR

## 2017-09-27 MED ORDER — OXYCODONE HCL 5 MG/5ML PO SOLN: 5.0000 mg | Freq: Once | ORAL | Status: DC | PRN

## 2017-09-27 MED ORDER — LACTATED RINGERS IV SOLN: 1000.0000 mL | INTRAVENOUS | Status: DC

## 2017-09-27 MED ORDER — LACTATED RINGERS IV SOLN: INTRAVENOUS | Status: DC

## 2017-09-27 MED ORDER — MIDAZOLAM HCL 2 MG/2ML IJ SOLN
INTRAMUSCULAR | Status: DC | PRN
  Administered 2017-09-27: 1 mg via INTRAVENOUS

## 2017-09-27 MED ORDER — OXYCODONE HCL 5 MG PO TABS: 5.0000 mg | ORAL_TABLET | Freq: Once | ORAL | Status: DC | PRN

## 2017-09-27 MED ORDER — CEFUROXIME OPHTHALMIC INJECTION 1 MG/0.1 ML
INJECTION | OPHTHALMIC | Status: DC | PRN
  Administered 2017-09-27: 0.1 mL via INTRACAMERAL

## 2017-09-27 MED ORDER — ARMC OPHTHALMIC DILATING DROPS
1.0000 "application " | OPHTHALMIC | Status: DC | PRN
  Administered 2017-09-27 (×3): 1 via OPHTHALMIC

## 2017-09-27 MED ORDER — EPINEPHRINE PF 1 MG/ML IJ SOLN
INTRAOCULAR | Status: DC | PRN
  Administered 2017-09-27: 08:00:00 via OPHTHALMIC

## 2017-09-27 SURGICAL SUPPLY — 25 items

## 2017-09-27 NOTE — Op Note (Signed)
LOCATION:  [facility]   PREOPERATIVE DIAGNOSIS:    Nuclear sclerotic cataract right eye. H25.11   POSTOPERATIVE DIAGNOSIS:  Nuclear sclerotic cataract right eye.     PROCEDURE:  Phacoemusification with posterior chamber intraocular lens placement of the right eye   LENS:   Implant Name Type Inv. Item Serial No. Manufacturer Lot No. LRB No. Used  LENS IOL DIOP 19.5 - [id number] Intraocular Lens LENS IOL DIOP 19.5 [id number]  Right 1        ULTRASOUND TIME: 18 % of 1 minutes, 49 seconds.  CDE 20.0   SURGEON:  Wyonia Hough, MD   ANESTHESIA:  Topical with tetracaine drops and 2% Xylocaine jelly, augmented with 1% preservative-free intracameral lidocaine.    COMPLICATIONS:  None.   DESCRIPTION OF PROCEDURE:  The patient was identified in the holding room and transported to the operating room and placed in the supine position under the operating microscope.  The right eye was identified as the operative eye and it was prepped and draped in the usual sterile ophthalmic fashion.   A 1 millimeter clear-corneal paracentesis was made at the 12:00 position.  0.5 ml of preservative-free 1% lidocaine was injected into the anterior chamber. The anterior chamber was filled with Viscoat viscoelastic.  A 2.4 millimeter keratome was used to make a near-clear corneal incision at the 9:00 position.  A curvilinear capsulorrhexis was made with a cystotome and capsulorrhexis forceps.  Balanced salt solution was used to hydrodissect and hydrodelineate the nucleus.   Phacoemulsification was then used in stop and chop fashion to remove the lens nucleus and epinucleus.  The remaining cortex was then removed using the irrigation and aspiration handpiece. Provisc was then placed into the capsular bag to distend it for lens placement.  A lens was then injected into the capsular bag.  The remaining viscoelastic was aspirated.   Wounds were hydrated with balanced salt solution.  The anterior  chamber was inflated to a physiologic pressure with balanced salt solution.  No wound leaks were noted. Cefuroxime 0.1 ml of a 10mg /ml solution was injected into the anterior chamber for a dose of 1 mg of intracameral antibiotic at the completion of the case.   Timolol and Brimonidine drops were applied to the eye.  The patient was taken to the recovery room in stable condition without complications of anesthesia or surgery.   Zayne Draheim 09/27/2017, 8:07 AM

## 2017-09-27 NOTE — Anesthesia Preprocedure Evaluation (Addendum)
Anesthesia Evaluation  Patient identified by MRN, date of birth, ID band  Reviewed: NPO status   History of Anesthesia Complications Negative for: history of anesthetic complications  Airway Mallampati: III  TM Distance: >3 FB Neck ROM: full    Dental  (+) Upper Dentures   Pulmonary neg pulmonary ROS,    Pulmonary exam normal        Cardiovascular Exercise Tolerance: Good hypertension, Normal cardiovascular exam     Neuro/Psych negative neurological ROS  negative psych ROS   GI/Hepatic negative GI ROS, Neg liver ROS,   Endo/Other  diabetes  Renal/GU negative Renal ROS  negative genitourinary   Musculoskeletal   Abdominal   Peds  Hematology Prostate cancer 2015    Anesthesia Other Findings   Reproductive/Obstetrics                            Anesthesia Physical Anesthesia Plan  ASA: II  Anesthesia Plan: MAC   Post-op Pain Management:    Induction:   PONV Risk Score and Plan:   Airway Management Planned:   Additional Equipment:   Intra-op Plan:   Post-operative Plan:   Informed Consent: I have reviewed the patients History and Physical, chart, labs and discussed the procedure including the risks, benefits and alternatives for the proposed anesthesia with the patient or authorized representative who has indicated his/her understanding and acceptance.     Plan Discussed with: CRNA  Anesthesia Plan Comments:        Anesthesia Quick Evaluation

## 2017-09-27 NOTE — Anesthesia Postprocedure Evaluation (Signed)
Anesthesia Post Note  Patient: Brandon Dillon.  Procedure(s) Performed: CATARACT EXTRACTION PHACO AND INTRAOCULAR LENS PLACEMENT (IOC) RIGHT (Right )  Patient location during evaluation: PACU Anesthesia Type: MAC Level of consciousness: awake and alert Pain management: pain level controlled Vital Signs Assessment: post-procedure vital signs reviewed and stable Respiratory status: spontaneous breathing, nonlabored ventilation, respiratory function stable and patient connected to nasal cannula oxygen Cardiovascular status: stable and blood pressure returned to baseline Postop Assessment: no apparent nausea or vomiting Anesthetic complications: no    Zia Najera

## 2017-09-27 NOTE — H&P (Signed)
The History and Physical notes are on paper, have been signed, and are to be scanned. The patient remains stable and unchanged from the H&P.   Previous H&P reviewed, patient examined, and there are no changes.  Tilak Oakley 09/27/2017 7:41 AM

## 2017-09-27 NOTE — Anesthesia Procedure Notes (Signed)
Procedure Name: MAC Date/Time: 09/27/2017 7:47 AM Performed by: Janna Arch, CRNA Pre-anesthesia Checklist: Patient identified, Emergency Drugs available, Suction available and Patient being monitored Patient Re-evaluated:Patient Re-evaluated prior to induction Oxygen Delivery Method: Nasal cannula

## 2017-09-27 NOTE — Transfer of Care (Signed)
Immediate Anesthesia Transfer of Care Note  Patient: Brandon Dillon.  Procedure(s) Performed: CATARACT EXTRACTION PHACO AND INTRAOCULAR LENS PLACEMENT (IOC) RIGHT (Right )  Patient Location: PACU  Anesthesia Type: MAC  Level of Consciousness: awake, alert  and patient cooperative  Airway and Oxygen Therapy: Patient Spontanous Breathing and Patient connected to supplemental oxygen  Post-op Assessment: Post-op Vital signs reviewed, Patient's Cardiovascular Status Stable, Respiratory Function Stable, Patent Airway and No signs of Nausea or vomiting  Post-op Vital Signs: Reviewed and stable  Complications: No apparent anesthesia complications

## 2017-09-28 ENCOUNTER — Encounter: Payer: Self-pay | Admitting: Ophthalmology

## 2017-10-10 DIAGNOSIS — H2512 Age-related nuclear cataract, left eye: Secondary | ICD-10-CM | POA: Diagnosis not present

## 2017-10-11 NOTE — Discharge Instructions (Signed)

## 2017-10-12 ENCOUNTER — Other Ambulatory Visit: Payer: Self-pay

## 2017-10-18 ENCOUNTER — Ambulatory Visit
Admission: RE | Admit: 2017-10-18 | Discharge: 2017-10-18 | Disposition: A | Discharge status: Home / Self Care | Payer: Medicare Other | Source: Ambulatory Visit | Attending: Ophthalmology | Admitting: Ophthalmology

## 2017-10-18 ENCOUNTER — Other Ambulatory Visit: Payer: Self-pay

## 2017-10-18 ENCOUNTER — Ambulatory Visit: Payer: Medicare Other | Admitting: Anesthesiology

## 2017-10-18 ENCOUNTER — Encounter: Payer: Self-pay | Admitting: *Deleted

## 2017-10-18 ENCOUNTER — Encounter
Admission: RE | Disposition: A | Discharge status: Home / Self Care | Payer: Self-pay | Source: Ambulatory Visit | Attending: Ophthalmology

## 2017-10-18 DIAGNOSIS — Z7984 Long term (current) use of oral hypoglycemic drugs: Secondary | ICD-10-CM | POA: Diagnosis not present

## 2017-10-18 DIAGNOSIS — Z7982 Long term (current) use of aspirin: Secondary | ICD-10-CM | POA: Insufficient documentation

## 2017-10-18 DIAGNOSIS — I10 Essential (primary) hypertension: Secondary | ICD-10-CM | POA: Diagnosis not present

## 2017-10-18 DIAGNOSIS — H25812 Combined forms of age-related cataract, left eye: Secondary | ICD-10-CM | POA: Diagnosis not present

## 2017-10-18 DIAGNOSIS — E7800 Pure hypercholesterolemia, unspecified: Secondary | ICD-10-CM | POA: Diagnosis not present

## 2017-10-18 DIAGNOSIS — E119 Type 2 diabetes mellitus without complications: Secondary | ICD-10-CM | POA: Insufficient documentation

## 2017-10-18 DIAGNOSIS — Z8546 Personal history of malignant neoplasm of prostate: Secondary | ICD-10-CM | POA: Insufficient documentation

## 2017-10-18 DIAGNOSIS — Z79899 Other long term (current) drug therapy: Secondary | ICD-10-CM | POA: Diagnosis not present

## 2017-10-18 DIAGNOSIS — H2512 Age-related nuclear cataract, left eye: Secondary | ICD-10-CM | POA: Insufficient documentation

## 2017-10-18 HISTORY — PX: CATARACT EXTRACTION W/PHACO: SHX586

## 2017-10-18 LAB — GLUCOSE, CAPILLARY
Glucose-Capillary: 82 mg/dL (ref 70–99)
Glucose-Capillary: 97 mg/dL (ref 70–99)

## 2017-10-18 SURGERY — PHACOEMULSIFICATION, CATARACT, WITH IOL INSERTION
Anesthesia: Monitor Anesthesia Care | Site: Eye | Laterality: Left | Wound class: "Clean "

## 2017-10-18 MED ORDER — CEFUROXIME OPHTHALMIC INJECTION 1 MG/0.1 ML
INJECTION | OPHTHALMIC | Status: DC | PRN
  Administered 2017-10-18: 0.1 mL via INTRACAMERAL

## 2017-10-18 MED ORDER — NA HYALUR & NA CHOND-NA HYALUR 0.4-0.35 ML IO KIT
PACK | INTRAOCULAR | Status: DC | PRN
  Administered 2017-10-18: 1 mL via INTRAOCULAR

## 2017-10-18 MED ORDER — LACTATED RINGERS IV SOLN: 10.0000 mL/h | INTRAVENOUS | Status: DC

## 2017-10-18 MED ORDER — LIDOCAINE HCL (PF) 2 % IJ SOLN
INTRAOCULAR | Status: DC | PRN
  Administered 2017-10-18: 1 mL

## 2017-10-18 MED ORDER — ARMC OPHTHALMIC DILATING DROPS
1.0000 "application " | OPHTHALMIC | Status: DC | PRN
  Administered 2017-10-18 (×3): 1 via OPHTHALMIC

## 2017-10-18 MED ORDER — ONDANSETRON HCL 4 MG/2ML IJ SOLN: 4.0000 mg | Freq: Once | INTRAMUSCULAR | Status: DC | PRN

## 2017-10-18 MED ORDER — FENTANYL CITRATE (PF) 100 MCG/2ML IJ SOLN
INTRAMUSCULAR | Status: DC | PRN
  Administered 2017-10-18: 50 ug via INTRAVENOUS

## 2017-10-18 MED ORDER — EPINEPHRINE PF 1 MG/ML IJ SOLN
INTRAOCULAR | Status: DC | PRN
  Administered 2017-10-18: 61 mL via OPHTHALMIC

## 2017-10-18 MED ORDER — MOXIFLOXACIN HCL 0.5 % OP SOLN
1.0000 [drp] | OPHTHALMIC | Status: DC | PRN
  Administered 2017-10-18 (×3): 1 [drp] via OPHTHALMIC

## 2017-10-18 MED ORDER — MIDAZOLAM HCL 2 MG/2ML IJ SOLN
INTRAMUSCULAR | Status: DC | PRN
  Administered 2017-10-18: 1 mg via INTRAVENOUS

## 2017-10-18 MED ORDER — BRIMONIDINE TARTRATE-TIMOLOL 0.2-0.5 % OP SOLN
OPHTHALMIC | Status: DC | PRN
  Administered 2017-10-18: 1 [drp] via OPHTHALMIC

## 2017-10-18 SURGICAL SUPPLY — 20 items
CANNULA ANT/CHMB 27G (MISCELLANEOUS) ×1 IMPLANT
CANNULA ANT/CHMB 27GA (MISCELLANEOUS) ×2 IMPLANT
GLOVE SURG LX 7.5 STRW (GLOVE) ×1
GLOVE SURG LX STRL 7.5 STRW (GLOVE) ×1 IMPLANT
GLOVE SURG TRIUMPH 8.0 PF LTX (GLOVE) ×2 IMPLANT
GOWN STRL REUS W/ TWL LRG LVL3 (GOWN DISPOSABLE) ×2 IMPLANT
GOWN STRL REUS W/TWL LRG LVL3 (GOWN DISPOSABLE) ×2
LENS IOL TECNIS ITEC 20.5 (Intraocular Lens) ×1 IMPLANT
MARKER SKIN DUAL TIP RULER LAB (MISCELLANEOUS) ×2 IMPLANT
NDL FILTER BLUNT 18X1 1/2 (NEEDLE) ×1 IMPLANT
NEEDLE FILTER BLUNT 18X 1/2SAF (NEEDLE) ×1
NEEDLE FILTER BLUNT 18X1 1/2 (NEEDLE) ×1 IMPLANT
PACK CATARACT BRASINGTON (MISCELLANEOUS) ×2 IMPLANT
PACK EYE AFTER SURG (MISCELLANEOUS) ×2 IMPLANT
PACK OPTHALMIC (MISCELLANEOUS) ×2 IMPLANT
SYR 3ML LL SCALE MARK (SYRINGE) ×2 IMPLANT
SYR 5ML LL (SYRINGE) ×2 IMPLANT
SYR TB 1ML LUER SLIP (SYRINGE) ×2 IMPLANT
WATER STERILE IRR 500ML POUR (IV SOLUTION) ×2 IMPLANT
WIPE NON LINTING 3.25X3.25 (MISCELLANEOUS) ×2 IMPLANT

## 2017-10-18 NOTE — Op Note (Signed)
OPERATIVE NOTE  Brandon Dillon [id number] 10/18/2017   PREOPERATIVE DIAGNOSIS:  Nuclear sclerotic cataract left eye. H25.12   POSTOPERATIVE DIAGNOSIS:    Nuclear sclerotic cataract left eye.     PROCEDURE:  Phacoemusification with posterior chamber intraocular lens placement of the left eye   LENS:   Implant Name Type Inv. Item Serial No. Manufacturer Lot No. LRB No. Used  LENS IOL DIOP 20.5 - [id number] Intraocular Lens LENS IOL DIOP 20.5 [id number]  Left 1        ULTRASOUND TIME: 18  % of 1 minutes 4 seconds, CDE 11.6  SURGEON:  Wyonia Hough, MD   ANESTHESIA:  Topical with tetracaine drops and 2% Xylocaine jelly, augmented with 1% preservative-free intracameral lidocaine.    COMPLICATIONS:  None.   DESCRIPTION OF PROCEDURE:  The patient was identified in the holding room and transported to the operating room and placed in the supine position under the operating microscope.  The left eye was identified as the operative eye and it was prepped and draped in the usual sterile ophthalmic fashion.   A 1 millimeter clear-corneal paracentesis was made at the 1:30 position.  0.5 ml of preservative-free 1% lidocaine was injected into the anterior chamber.  The anterior chamber was filled with Viscoat viscoelastic.  A 2.4 millimeter keratome was used to make a near-clear corneal incision at the 10:30 position.  .  A curvilinear capsulorrhexis was made with a cystotome and capsulorrhexis forceps.  Balanced salt solution was used to hydrodissect and hydrodelineate the nucleus.   Phacoemulsification was then used in stop and chop fashion to remove the lens nucleus and epinucleus.  The remaining cortex was then removed using the irrigation and aspiration handpiece. Provisc was then placed into the capsular bag to distend it for lens placement.  A lens was then injected into the capsular bag.  The remaining viscoelastic was aspirated.   Wounds were hydrated with balanced salt  solution.  The anterior chamber was inflated to a physiologic pressure with balanced salt solution.  No wound leaks were noted. Cefuroxime 0.1 ml of a 10mg /ml solution was injected into the anterior chamber for a dose of 1 mg of intracameral antibiotic at the completion of the case.   Timolol and Brimonidine drops were applied to the eye.  The patient was taken to the recovery room in stable condition without complications of anesthesia or surgery.  Brandon Dillon 10/18/2017, 8:00 AM

## 2017-10-18 NOTE — Anesthesia Procedure Notes (Signed)
Procedure Name: MAC Performed by: Ashantia Amaral, CRNA Pre-anesthesia Checklist: Patient identified, Emergency Drugs available, Suction available, Timeout performed and Patient being monitored Patient Re-evaluated:Patient Re-evaluated prior to induction Oxygen Delivery Method: Nasal cannula Placement Confirmation: positive ETCO2       

## 2017-10-18 NOTE — Anesthesia Preprocedure Evaluation (Signed)
Anesthesia Evaluation  Patient identified by MRN, date of birth, ID band  Reviewed: NPO status   History of Anesthesia Complications Negative for: history of anesthetic complications  Airway Mallampati: III  TM Distance: >3 FB Neck ROM: full    Dental  (+) Upper Dentures   Pulmonary neg pulmonary ROS,    breath sounds clear to auscultation       Cardiovascular Exercise Tolerance: Good hypertension,  Rhythm:Regular Rate:Normal     Neuro/Psych negative neurological ROS  negative psych ROS   GI/Hepatic negative GI ROS, Neg liver ROS,   Endo/Other  diabetes  Renal/GU negative Renal ROS  negative genitourinary   Musculoskeletal   Abdominal   Peds  Hematology Prostate cancer 2015    Anesthesia Other Findings   Reproductive/Obstetrics                             Anesthesia Physical  Anesthesia Plan  ASA: II  Anesthesia Plan: MAC   Post-op Pain Management:    Induction:   PONV Risk Score and Plan:   Airway Management Planned: Nasal Cannula  Additional Equipment:   Intra-op Plan:   Post-operative Plan:   Informed Consent: I have reviewed the patients History and Physical, chart, labs and discussed the procedure including the risks, benefits and alternatives for the proposed anesthesia with the patient or authorized representative who has indicated his/her understanding and acceptance.     Plan Discussed with: CRNA  Anesthesia Plan Comments:         Anesthesia Quick Evaluation

## 2017-10-18 NOTE — Transfer of Care (Signed)
Immediate Anesthesia Transfer of Care Note  Patient: Brandon Dillon.  Procedure(s) Performed: CATARACT EXTRACTION PHACO AND INTRAOCULAR LENS PLACEMENT (IOC)  LEFT DIABETIC (Left Eye)  Patient Location: PACU  Anesthesia Type: MAC  Level of Consciousness: awake, alert  and patient cooperative  Airway and Oxygen Therapy: Patient Spontanous Breathing and Patient connected to supplemental oxygen  Post-op Assessment: Post-op Vital signs reviewed, Patient's Cardiovascular Status Stable, Respiratory Function Stable, Patent Airway and No signs of Nausea or vomiting  Post-op Vital Signs: Reviewed and stable  Complications: No apparent anesthesia complications

## 2017-10-18 NOTE — H&P (Signed)
The History and Physical notes are on paper, have been signed, and are to be scanned. The patient remains stable and unchanged from the H&P.   Previous H&P reviewed, patient examined, and there are no changes.  Brandon Dillon 10/18/2017 7:38 AM

## 2017-10-18 NOTE — Anesthesia Postprocedure Evaluation (Signed)
Anesthesia Post Note  Patient: Brandon Dillon.  Procedure(s) Performed: CATARACT EXTRACTION PHACO AND INTRAOCULAR LENS PLACEMENT (IOC)  LEFT DIABETIC (Left Eye)  Patient location during evaluation: PACU Anesthesia Type: MAC Level of consciousness: awake and alert Pain management: pain level controlled Vital Signs Assessment: post-procedure vital signs reviewed and stable Respiratory status: spontaneous breathing, nonlabored ventilation, respiratory function stable and patient connected to nasal cannula oxygen Cardiovascular status: stable and blood pressure returned to baseline Postop Assessment: no apparent nausea or vomiting Anesthetic complications: no    Veda Canning

## 2017-10-19 ENCOUNTER — Encounter: Payer: Self-pay | Admitting: Ophthalmology

## 2017-11-02 ENCOUNTER — Ambulatory Visit (INDEPENDENT_AMBULATORY_CARE_PROVIDER_SITE_OTHER): Payer: Medicare Other | Admitting: Family Medicine

## 2017-11-02 ENCOUNTER — Encounter: Payer: Self-pay | Admitting: Family Medicine

## 2017-11-02 VITALS — BP 117/79 | HR 79 | Resp 16 | Ht 74.0 in | Wt 213.0 lb

## 2017-11-02 DIAGNOSIS — E119 Type 2 diabetes mellitus without complications: Secondary | ICD-10-CM | POA: Diagnosis not present

## 2017-11-02 DIAGNOSIS — Z23 Encounter for immunization: Secondary | ICD-10-CM | POA: Diagnosis not present

## 2017-11-02 DIAGNOSIS — I10 Essential (primary) hypertension: Secondary | ICD-10-CM

## 2017-11-02 DIAGNOSIS — N400 Enlarged prostate without lower urinary tract symptoms: Secondary | ICD-10-CM | POA: Diagnosis not present

## 2017-11-02 DIAGNOSIS — E782 Mixed hyperlipidemia: Secondary | ICD-10-CM

## 2017-11-02 MED ORDER — HYDROCHLOROTHIAZIDE 25 MG PO TABS: ORAL_TABLET | ORAL | 0 refills | Status: DC

## 2017-11-02 MED ORDER — METFORMIN HCL 500 MG PO TABS: ORAL_TABLET | ORAL | 0 refills | Status: DC

## 2017-11-02 MED ORDER — TAMSULOSIN HCL 0.4 MG PO CAPS: 0.4000 mg | ORAL_CAPSULE | Freq: Every day | ORAL | 6 refills | Status: DC

## 2017-11-02 MED ORDER — SIMVASTATIN 20 MG PO TABS: ORAL_TABLET | ORAL | 0 refills | Status: DC

## 2017-11-02 NOTE — Progress Notes (Signed)
Name: Brandon Dillon.   MRN: [id number]    DOB: 12/14/1927   Date:11/02/2017       Progress Note  Subjective  Chief Complaint  Chief Complaint  Patient presents with  . Hypertension  . Diabetes    BS 102    Hypertension  This is a chronic problem. The current episode started more than 1 year ago. The problem is unchanged. The problem is controlled. Pertinent negatives include no anxiety, blurred vision, chest pain, headaches, malaise/fatigue, neck pain, orthopnea, palpitations, peripheral edema, PND, shortness of breath or sweats. There are no associated agents to hypertension. Risk factors for coronary artery disease include diabetes mellitus, dyslipidemia, male gender and post-menopausal state. Past treatments include diuretics. The current treatment provides moderate improvement. There are no compliance problems.  There is no history of angina, kidney disease, CAD/MI, CVA, heart failure, left ventricular hypertrophy, PVD or retinopathy. There is no history of chronic renal disease, a hypertension causing med or renovascular disease.  Diabetes  He presents for his follow-up diabetic visit. He has type 2 diabetes mellitus. His disease course has been stable. Pertinent negatives for hypoglycemia include no confusion, dizziness, headaches, hunger, mood changes, nervousness/anxiousness, pallor, seizures, sleepiness, speech difficulty, sweats or tremors. Pertinent negatives for diabetes include no blurred vision, no chest pain, no fatigue, no foot paresthesias, no foot ulcerations, no polydipsia, no polyphagia, no polyuria, no visual change, no weakness and no weight loss. There are no hypoglycemic complications. Symptoms are stable. There are no diabetic complications. Pertinent negatives for diabetic complications include no CVA, PVD or retinopathy. Risk factors for coronary artery disease include dyslipidemia and hypertension. Current diabetic treatment includes oral agent (monotherapy). He is  compliant with treatment some of the time. His weight is stable. He is following a generally healthy diet. He participates in exercise intermittently. His breakfast blood glucose is taken between 8-9 am. His breakfast blood glucose range is generally 90-110 mg/dl. He does not see a podiatrist.Eye exam is not current.  Hyperlipidemia  This is a new problem. The current episode started more than 1 year ago. The problem is controlled. Recent lipid tests were reviewed and are normal. He has no history of chronic renal disease, diabetes, hypothyroidism, liver disease, obesity or nephrotic syndrome. Factors aggravating his hyperlipidemia include thiazides. Pertinent negatives include no chest pain, focal sensory loss, focal weakness, leg pain, myalgias or shortness of breath. Current antihyperlipidemic treatment includes statins. The current treatment provides moderate improvement of lipids. There are no compliance problems.  Risk factors for coronary artery disease include diabetes mellitus, dyslipidemia, hypertension and male sex.  Male GU Problem  The patient's pertinent negatives include no genital injury, genital itching, genital lesions, pelvic pain, penile discharge, penile pain, priapism, scrotal swelling or testicular pain. Primary symptoms comment: prostate cancer. The current episode started more than 1 year ago. The problem occurs rarely. The patient is experiencing no pain. Associated symptoms include frequency. Pertinent negatives include no abdominal pain, chest pain, chills, constipation, coughing, diarrhea, dysuria, fever, headaches, hesitancy, joint pain, nausea, rash, shortness of breath, sore throat or urgency. Nothing aggravates the symptoms. Treatments tried: tamulosin/leupron. His past medical history is significant for BPH. (Prostate cancer.)    Essential hypertension Chronic Controlled Continue HCTZ 25 MG daily. Gheck renal panel.  Type 2 diabetes mellitus without complication, without  long-term current use of insulin (HCC) Chronic Controlled. Continue metformen 500 MG bid. Check A1C and renal panel.  Prostate hypertrophy Chronic Improved subjectively. Continue tamsulosin 0.4 MG daily   Hyperlipidemia Chronic  controlled Continue simvastatin 20 mg daily. Check lipid panel.   Past Medical History:  Diagnosis Date  . Diabetes mellitus without complication ([place])   . Hearing loss   . Hx of fracture of wrist 07/20/2017   >30 years ago. <1989  . Hyperlipidemia   . Hypertension   . Prostate cancer [facility])     Past Surgical History:  Procedure Laterality Date  . CATARACT EXTRACTION W/PHACO Right 09/27/2017   Procedure: CATARACT EXTRACTION PHACO AND INTRAOCULAR LENS PLACEMENT ([place]) RIGHT;  Surgeon: Leandrew Koyanagi, MD;  Location: [place];  Service: Ophthalmology;  Laterality: Right;  IVA TOPICAL DIABETES  . CATARACT EXTRACTION W/PHACO Left 10/18/2017   Procedure: CATARACT EXTRACTION PHACO AND INTRAOCULAR LENS PLACEMENT ([place])  LEFT DIABETIC;  Surgeon: Leandrew Koyanagi, MD;  Location: [place];  Service: Ophthalmology;  Laterality: Left;  DIABETIC-ORAL MED  . COLONOSCOPY  2011   normal- Dr?    History reviewed. No pertinent family history.  Social History   Socioeconomic History  . Marital status: Married    Spouse name: Not on file  . Number of children: Not on file  . Years of education: Not on file  . Highest education level: Not on file  Occupational History  . Not on file  Social Needs  . Financial resource strain: Not on file  . Food insecurity:    Worry: Not on file    Inability: Not on file  . Transportation needs:    Medical: Not on file    Non-medical: Not on file  Tobacco Use  . Smoking status: Never Smoker  . Smokeless tobacco: Never Used  Substance and Sexual Activity  . Alcohol use: No    Alcohol/week: 0.0 oz  . Drug use: No  . Sexual activity: Yes  Lifestyle  . Physical activity:    Days per week: Not on  file    Minutes per session: Not on file  . Stress: Not on file  Relationships  . Social connections:    Talks on phone: Not on file    Gets together: Not on file    Attends religious service: Not on file    Active member of club or organization: Not on file    Attends meetings of clubs or organizations: Not on file    Relationship status: Not on file  . Intimate partner violence:    Fear of current or ex partner: Not on file    Emotionally abused: Not on file    Physically abused: Not on file    Forced sexual activity: Not on file  Other Topics Concern  . Not on file  Social History Narrative  . Not on file    Allergies  Allergen Reactions  . Lisinopril     coughing coughing    Outpatient Medications Prior to Visit  Medication Sig Dispense Refill  . aspirin 81 MG tablet Take 81 mg by mouth daily.    Marland Kitchen glucose blood (GE100 BLOOD GLUCOSE TEST) test strip 1 each by Other route daily. Use as instructed- Dx E11.9 100 each 1  . Lancets (FREESTYLE) lancets AS DIRECTED 100 each 0  . megestrol (MEGACE) 20 MG tablet Take 1 tablet (20 mg total) by mouth 2 (two) times daily as needed (Hot flashes). 60 tablet 3  . hydrochlorothiazide (HYDRODIURIL) 25 MG tablet TAKE 1 TABLET EVERY DAY 30 tablet 0  . metFORMIN (GLUCOPHAGE) 500 MG tablet TAKE 1 TABLET EVERY DAY 30 tablet 0  . simvastatin (ZOCOR) 20 MG tablet  TAKE 1 TABLET EVERY DAY (Patient taking differently: TAKE 1 TABLET EVERY DAY/ PM) 30 tablet 0  . tamsulosin (FLOMAX) 0.4 MG CAPS capsule Take 1 capsule (0.4 mg total) by mouth daily. 30 capsule 6   No facility-administered medications prior to visit.     Review of Systems  Constitutional: Negative for chills, fatigue, fever, malaise/fatigue and weight loss.  HENT: Negative for ear discharge, ear pain and sore throat.   Eyes: Negative for blurred vision.  Respiratory: Negative for cough, sputum production, shortness of breath and wheezing.   Cardiovascular: Negative for chest  pain, palpitations, orthopnea, leg swelling and PND.  Gastrointestinal: Negative for abdominal pain, blood in stool, constipation, diarrhea, heartburn, melena and nausea.  Genitourinary: Positive for frequency. Negative for discharge, dysuria, hematuria, hesitancy, pelvic pain, penile pain, scrotal swelling, testicular pain and urgency.  Musculoskeletal: Negative for back pain, joint pain, myalgias and neck pain.  Skin: Negative for pallor and rash.  Neurological: Negative for dizziness, tingling, tremors, sensory change, focal weakness, seizures, speech difficulty, weakness and headaches.  Endo/Heme/Allergies: Negative for environmental allergies, polydipsia and polyphagia. Does not bruise/bleed easily.  Psychiatric/Behavioral: Negative for confusion, depression and suicidal ideas. The patient is not nervous/anxious and does not have insomnia.      Objective  Vitals:   11/02/17 1056  BP: 117/79  Pulse: 79  Resp: 16  SpO2: 99%  Weight: 213 lb (96.6 kg)  Height: 6\' 2"  (1.88 m)    Physical Exam  Constitutional: He is oriented to person, place, and time.  HENT:  Head: Normocephalic.  Right Ear: External ear normal.  Left Ear: External ear normal.  Nose: Nose normal.  Mouth/Throat: Oropharynx is clear and moist.  Eyes: Pupils are equal, round, and reactive to light. Conjunctivae and EOM are normal. Right eye exhibits no discharge. Left eye exhibits no discharge. No scleral icterus.  Neck: Normal range of motion. Neck supple. No JVD present. No tracheal deviation present. No thyromegaly present.  Cardiovascular: Normal rate, regular rhythm, normal heart sounds and intact distal pulses. Exam reveals no gallop and no friction rub.  No murmur heard. Pulmonary/Chest: Breath sounds normal. No respiratory distress. He has no wheezes. He has no rales.  Abdominal: Soft. Bowel sounds are normal. He exhibits no mass. There is no hepatosplenomegaly. There is no tenderness. There is no rebound, no  guarding and no CVA tenderness.  Musculoskeletal: Normal range of motion. He exhibits no edema or tenderness.  Feet:  Right Foot:  Protective Sensation: 10 sites tested. 10 sites sensed.  Skin Integrity: Negative for ulcer, blister, skin breakdown, erythema, warmth, callus or dry skin.  Left Foot:  Protective Sensation: 10 sites tested. 10 sites sensed.  Skin Integrity: Negative for ulcer, blister, skin breakdown, erythema, warmth, callus or dry skin.  Lymphadenopathy:    He has no cervical adenopathy.  Neurological: He is alert and oriented to person, place, and time. He has normal strength and normal reflexes. No cranial nerve deficit.  Skin: Skin is warm. No rash noted.      Assessment & Plan  Problem List Items Addressed This Visit      Cardiovascular and Mediastinum   Essential hypertension    Chronic Controlled Continue HCTZ 25 MG daily. Gheck renal panel.      Relevant Medications   hydrochlorothiazide (HYDRODIURIL) 25 MG tablet   simvastatin (ZOCOR) 20 MG tablet   Other Relevant Orders   Renal Function Panel     Endocrine   Type 2 diabetes mellitus without complication, without  long-term current use of insulin (HCC) - Primary    Chronic Controlled. Continue metformen 500 MG bid. Check A1C and renal panel.      Relevant Medications   simvastatin (ZOCOR) 20 MG tablet   metFORMIN (GLUCOPHAGE) 500 MG tablet   Other Relevant Orders   Hemoglobin A1c   Renal Function Panel     Other   Hyperlipidemia    Chronic controlled Continue simvastatin 20 mg daily. Check lipid panel.      Relevant Medications   hydrochlorothiazide (HYDRODIURIL) 25 MG tablet   simvastatin (ZOCOR) 20 MG tablet   Other Relevant Orders   Lipid panel   Prostate hypertrophy    Chronic Improved subjectively. Continue tamsulosin 0.4 MG daily       Relevant Medications   tamsulosin (FLOMAX) 0.4 MG CAPS capsule    Other Visit Diagnoses    Need for vaccination against Streptococcus pneumoniae  using pneumococcal conjugate vaccine 13       Patient due for pneum vaccine 13/administered IM   Relevant Orders   Pneumococcal conjugate vaccine 13-valent IM (Completed)      Meds ordered this encounter  Medications  . hydrochlorothiazide (HYDRODIURIL) 25 MG tablet    Sig: TAKE 1 TABLET EVERY DAY    Dispense:  30 tablet    Refill:  0  . simvastatin (ZOCOR) 20 MG tablet    Sig: TAKE 1 TABLET EVERY DAY/ PM    Dispense:  30 tablet    Refill:  0    Needs appt  . metFORMIN (GLUCOPHAGE) 500 MG tablet    Sig: TAKE 1 TABLET EVERY DAY    Dispense:  30 tablet    Refill:  0  . tamsulosin (FLOMAX) 0.4 MG CAPS capsule    Sig: Take 1 capsule (0.4 mg total) by mouth daily.    Dispense:  30 capsule    Refill:  6      Dr. Otilio Miu [facility]  11/02/17

## 2017-11-02 NOTE — Assessment & Plan Note (Signed)
Chronic Controlled Continue HCTZ 25 MG daily. Gheck renal panel.

## 2017-11-02 NOTE — Assessment & Plan Note (Signed)
Chronic controlled Continue simvastatin 20 mg daily. Check lipid panel.

## 2017-11-02 NOTE — Assessment & Plan Note (Addendum)
Chronic Improved subjectively. Continue tamsulosin 0.4 MG daily

## 2017-11-02 NOTE — Assessment & Plan Note (Signed)
Chronic Controlled. Continue metformen 500 MG bid. Check A1C and renal panel.

## 2017-11-03 LAB — LIPID PANEL
Chol/HDL Ratio: 3.7 ratio (ref 0.0–5.0)
Cholesterol, Total: 184 mg/dL (ref 100–199)
HDL: 50 mg/dL (ref >39)
LDL Calculated: 115 mg/dL — ABNORMAL HIGH (ref 0–99)
Triglycerides: 94 mg/dL (ref 0–149)
VLDL Cholesterol Cal: 19 mg/dL (ref 5–40)

## 2017-11-03 LAB — RENAL FUNCTION PANEL
Albumin: 4.5 g/dL (ref 3.5–4.7)
BUN/Creatinine Ratio: 15 (ref 10–24)
BUN: 20 mg/dL (ref 8–27)
CO2: 23 mmol/L (ref 20–29)
Calcium: 9.6 mg/dL (ref 8.6–10.2)
Chloride: 102 mmol/L (ref 96–106)
Creatinine, Ser: 1.32 mg/dL — ABNORMAL HIGH (ref 0.76–1.27)
GFR calc Af Amer: 56 mL/min/{1.73_m2} — ABNORMAL LOW (ref >59)
GFR calc non Af Amer: 48 mL/min/{1.73_m2} — ABNORMAL LOW (ref >59)
Glucose: 75 mg/dL (ref 65–99)
Phosphorus: 3.6 mg/dL (ref 2.5–4.5)
Potassium: 4.3 mmol/L (ref 3.5–5.2)
Sodium: 141 mmol/L (ref 134–144)

## 2017-11-03 LAB — HEMOGLOBIN A1C
Est. average glucose Bld gHb Est-mCnc: 103 mg/dL
Hgb A1c MFr Bld: 5.2 % (ref 4.8–5.6)

## 2017-11-08 ENCOUNTER — Other Ambulatory Visit: Payer: Medicare Other

## 2017-11-08 ENCOUNTER — Other Ambulatory Visit: Payer: Self-pay

## 2017-11-08 DIAGNOSIS — C61 Malignant neoplasm of prostate: Secondary | ICD-10-CM

## 2017-11-09 LAB — PSA: Prostate Specific Ag, Serum: 0.5 ng/mL (ref 0.0–4.0)

## 2017-11-13 ENCOUNTER — Telehealth: Payer: Self-pay

## 2017-11-13 NOTE — Telephone Encounter (Signed)
-----   Message from Abbie Sons, MD sent at 11/11/2017  1:46 PM EDT ----- PSA stable at 0.5.  Repeat PSA 3 months at the time of his Lupron injection.

## 2017-11-13 NOTE — Telephone Encounter (Signed)
Pt informed

## 2017-11-20 ENCOUNTER — Other Ambulatory Visit: Payer: Medicare Other

## 2017-11-20 DIAGNOSIS — R7989 Other specified abnormal findings of blood chemistry: Secondary | ICD-10-CM | POA: Diagnosis not present

## 2017-11-21 LAB — CREATININE, SERUM
Creatinine, Ser: 1.15 mg/dL (ref 0.76–1.27)
GFR calc Af Amer: 66 mL/min/{1.73_m2} (ref >59)
GFR calc non Af Amer: 57 mL/min/{1.73_m2} — ABNORMAL LOW (ref >59)

## 2017-12-06 DIAGNOSIS — Z961 Presence of intraocular lens: Secondary | ICD-10-CM | POA: Diagnosis not present

## 2018-01-26 ENCOUNTER — Other Ambulatory Visit: Payer: Self-pay | Admitting: Family Medicine

## 2018-01-26 DIAGNOSIS — E119 Type 2 diabetes mellitus without complications: Secondary | ICD-10-CM

## 2018-01-26 DIAGNOSIS — I10 Essential (primary) hypertension: Secondary | ICD-10-CM

## 2018-02-13 ENCOUNTER — Ambulatory Visit (INDEPENDENT_AMBULATORY_CARE_PROVIDER_SITE_OTHER): Payer: Medicare Other | Admitting: Family Medicine

## 2018-02-13 DIAGNOSIS — C61 Malignant neoplasm of prostate: Secondary | ICD-10-CM

## 2018-02-13 MED ORDER — LEUPROLIDE ACETATE (6 MONTH) 45 MG IM KIT
45.0000 mg | PACK | Freq: Once | INTRAMUSCULAR | Status: AC
  Administered 2018-02-13: 45 mg via INTRAMUSCULAR

## 2018-02-13 NOTE — Progress Notes (Signed)
Lupron IM Injection   Due to Prostate Cancer patient is present today for a Lupron Injection.  Medication: Lupron 6 month Dose: 45 mg  Location: right upper outer buttocks Lot: [id number] Exp: 05/13/2020  Patient tolerated well, no complications were noted  Performed by: Elberta Leatherwood, CMA  Follow up: 6 month

## 2018-02-22 ENCOUNTER — Other Ambulatory Visit: Payer: Self-pay | Admitting: Family Medicine

## 2018-02-22 DIAGNOSIS — E782 Mixed hyperlipidemia: Secondary | ICD-10-CM

## 2018-03-21 ENCOUNTER — Other Ambulatory Visit: Payer: Self-pay | Admitting: Family Medicine

## 2018-04-04 ENCOUNTER — Ambulatory Visit (INDEPENDENT_AMBULATORY_CARE_PROVIDER_SITE_OTHER): Payer: Medicare Other | Admitting: Family Medicine

## 2018-04-04 ENCOUNTER — Encounter: Payer: Self-pay | Admitting: Family Medicine

## 2018-04-04 VITALS — BP 110/60 | HR 80 | Ht 74.0 in | Wt 210.0 lb

## 2018-04-04 DIAGNOSIS — E782 Mixed hyperlipidemia: Secondary | ICD-10-CM

## 2018-04-04 DIAGNOSIS — E119 Type 2 diabetes mellitus without complications: Secondary | ICD-10-CM

## 2018-04-04 DIAGNOSIS — I10 Essential (primary) hypertension: Secondary | ICD-10-CM

## 2018-04-04 MED ORDER — SIMVASTATIN 20 MG PO TABS: ORAL_TABLET | ORAL | 3 refills | Status: DC

## 2018-04-04 MED ORDER — METFORMIN HCL 500 MG PO TABS: ORAL_TABLET | ORAL | 3 refills | Status: DC

## 2018-04-04 MED ORDER — HYDROCHLOROTHIAZIDE 25 MG PO TABS: ORAL_TABLET | ORAL | 3 refills | Status: DC

## 2018-04-04 NOTE — Progress Notes (Signed)
Date:  04/04/2018   Name:  Brandon Dillon.   DOB:  22-Nov-1927   MRN:  [id number]   Chief Complaint: Diabetes (needs micro and labs); Hypertension; and Hyperlipidemia Diabetes  He presents for his follow-up diabetic visit. He has type 2 diabetes mellitus. His disease course has been stable. There are no hypoglycemic associated symptoms. Pertinent negatives for hypoglycemia include no dizziness, headaches, nervousness/anxiousness or sweats. Pertinent negatives for diabetes include no blurred vision, no chest pain, no fatigue, no foot paresthesias, no foot ulcerations, no polydipsia, no polyphagia, no polyuria, no visual change, no weakness and no weight loss. There are no hypoglycemic complications. Symptoms are stable. There are no diabetic complications. Pertinent negatives for diabetic complications include no CVA, PVD or retinopathy. Risk factors for coronary artery disease include diabetes mellitus, hypertension and male sex. Current diabetic treatment includes oral agent (monotherapy). He is compliant with treatment all of the time. His weight is stable. He is following a generally healthy diet. Meal planning includes avoidance of concentrated sweets and carbohydrate counting. He participates in exercise daily. His breakfast blood glucose is taken between 7-8 am. His breakfast blood glucose range is generally 90-110 mg/dl. An ACE inhibitor/angiotensin II receptor blocker is not being taken. Eye exam is current.  Hypertension  This is a chronic problem. The current episode started more than 1 year ago. The problem is controlled. Pertinent negatives include no anxiety, blurred vision, chest pain, headaches, malaise/fatigue, neck pain, orthopnea, palpitations, peripheral edema, PND, shortness of breath or sweats. Risk factors for coronary artery disease include dyslipidemia and diabetes mellitus. Past treatments include diuretics. The current treatment provides moderate improvement. There is no  history of angina, kidney disease, CAD/MI, CVA, heart failure, left ventricular hypertrophy, PVD or retinopathy. There is no history of chronic renal disease, a hypertension causing med or renovascular disease.  Hyperlipidemia  This is a chronic problem. The current episode started more than 1 year ago. The problem is controlled. Recent lipid tests were reviewed and are normal. Exacerbating diseases include diabetes. He has no history of chronic renal disease or hypothyroidism. Factors aggravating his hyperlipidemia include thiazides. Pertinent negatives include no chest pain, myalgias or shortness of breath. Current antihyperlipidemic treatment includes statins. The current treatment provides mild improvement of lipids. There are no compliance problems.  Risk factors for coronary artery disease include hypertension, dyslipidemia and diabetes mellitus.     Review of Systems  Constitutional: Negative for chills, fatigue, fever, malaise/fatigue and weight loss.  HENT: Negative for drooling, ear discharge, ear pain and sore throat.   Eyes: Negative for blurred vision.  Respiratory: Negative for cough, shortness of breath and wheezing.   Cardiovascular: Negative for chest pain, palpitations, orthopnea, leg swelling and PND.  Gastrointestinal: Negative for abdominal pain, blood in stool, constipation, diarrhea and nausea.  Endocrine: Negative for polydipsia, polyphagia and polyuria.  Genitourinary: Negative for dysuria, frequency, hematuria and urgency.  Musculoskeletal: Negative for back pain, myalgias and neck pain.  Skin: Negative for rash.  Allergic/Immunologic: Negative for environmental allergies.  Neurological: Negative for dizziness, weakness and headaches.  Hematological: Does not bruise/bleed easily.  Psychiatric/Behavioral: Negative for suicidal ideas. The patient is not nervous/anxious.     Patient Active Problem List   Diagnosis Date Noted  . Essential hypertension 05/11/2015  .  Type 2 diabetes mellitus without complication, without long-term current use of insulin ([place]) 05/11/2015  . Primary prostate adenocarcinoma ([place]) 05/11/2015  . Hyperlipidemia 05/11/2015  . Prostate hypertrophy 05/11/2015  . Taking multiple medications for  chronic disease 05/11/2015  . Benign prostatic hyperplasia with urinary obstruction 12/07/2014  . Elevated prostate specific antigen (PSA) 12/04/2014    Allergies  Allergen Reactions  . Lisinopril     coughing coughing    Past Surgical History:  Procedure Laterality Date  . CATARACT EXTRACTION W/PHACO Right 09/27/2017   Procedure: CATARACT EXTRACTION PHACO AND INTRAOCULAR LENS PLACEMENT ([place]) RIGHT;  Surgeon: Leandrew Koyanagi, MD;  Location: [place];  Service: Ophthalmology;  Laterality: Right;  IVA TOPICAL DIABETES  . CATARACT EXTRACTION W/PHACO Left 10/18/2017   Procedure: CATARACT EXTRACTION PHACO AND INTRAOCULAR LENS PLACEMENT ([place])  LEFT DIABETIC;  Surgeon: Leandrew Koyanagi, MD;  Location: [place];  Service: Ophthalmology;  Laterality: Left;  DIABETIC-ORAL MED  . COLONOSCOPY  2011   normal- Dr?    Social History   Tobacco Use  . Smoking status: Never Smoker  . Smokeless tobacco: Never Used  Substance Use Topics  . Alcohol use: No    Alcohol/week: 0.0 standard drinks  . Drug use: No     Medication list has been reviewed and updated.  Current Meds  Medication Sig  . aspirin 81 MG tablet Take 81 mg by mouth daily.  Marland Kitchen glucose blood (GE100 BLOOD GLUCOSE TEST) test strip 1 each by Other route daily. Use as instructed- Dx E11.9  . hydrochlorothiazide (HYDRODIURIL) 25 MG tablet TAKE 1 TABLET EVERY DAY  . Lancets (FREESTYLE) lancets AS DIRECTED  . megestrol (MEGACE) 20 MG tablet Take 1 tablet (20 mg total) by mouth 2 (two) times daily as needed (Hot flashes). (Patient taking differently: Take 20 mg by mouth 2 (two) times daily as needed (Hot flashes). Dr Bernardo Heater)  . metFORMIN (GLUCOPHAGE) 500  MG tablet TAKE 1 TABLET EVERY DAY  . simvastatin (ZOCOR) 20 MG tablet TAKE 1 TABLET EVERY DAY/ PM  . [DISCONTINUED] hydrochlorothiazide (HYDRODIURIL) 25 MG tablet TAKE 1 TABLET EVERY DAY  . [DISCONTINUED] metFORMIN (GLUCOPHAGE) 500 MG tablet TAKE 1 TABLET EVERY DAY  . [DISCONTINUED] simvastatin (ZOCOR) 20 MG tablet TAKE 1 TABLET EVERY DAY/ PM    PHQ 2/9 Scores 04/04/2018 11/02/2017 02/15/2017 05/11/2015  PHQ - 2 Score 0 0 0 0  PHQ- 9 Score 0 - 0 -    Physical Exam  Constitutional: He is oriented to person, place, and time.  HENT:  Head: Normocephalic.  Right Ear: External ear normal.  Left Ear: External ear normal.  Nose: Nose normal.  Mouth/Throat: Oropharynx is clear and moist.  Eyes: Pupils are equal, round, and reactive to light. Conjunctivae and EOM are normal. Right eye exhibits no discharge. Left eye exhibits no discharge. No scleral icterus.  Neck: Normal range of motion. Neck supple. No JVD present. No tracheal deviation present. No thyromegaly present.  Cardiovascular: Normal rate, regular rhythm, normal heart sounds and intact distal pulses. Exam reveals no gallop and no friction rub.  No murmur heard. Pulmonary/Chest: Breath sounds normal. No respiratory distress. He has no wheezes. He has no rales.  Abdominal: Soft. Bowel sounds are normal. He exhibits no mass. There is no hepatosplenomegaly. There is no tenderness. There is no rigidity, no rebound, no guarding and no CVA tenderness.  Musculoskeletal: Normal range of motion. He exhibits no edema or tenderness.  Lymphadenopathy:    He has no cervical adenopathy.  Neurological: He is alert and oriented to person, place, and time. He has normal strength and normal reflexes. No cranial nerve deficit.  Skin: Skin is warm. No rash noted.  Nursing note and vitals reviewed.  BP 110/60   Pulse 80   Ht 6\' 2"  (1.88 m)   Wt 210 lb (95.3 kg)   BMI 26.96 kg/m   Assessment and Plan:  1. Mixed hyperlipidemia Chronic, stable on  meds- refill simvastatin/ draw lipid - simvastatin (ZOCOR) 20 MG tablet; TAKE 1 TABLET EVERY DAY/ PM  Dispense: 30 tablet; Refill: 3 - Lipid panel  2. Essential hypertension Chronic, stable on med- refill HCTZ/ draw renal panel - hydrochlorothiazide (HYDRODIURIL) 25 MG tablet; TAKE 1 TABLET EVERY DAY  Dispense: 30 tablet; Refill: 3 - Renal Function Panel  3. Type 2 diabetes mellitus without complication, without long-term current use of insulin (HCC) Chronic, stable- refill metformin/ draw A1C, renal and obtain microalbumin - metFORMIN (GLUCOPHAGE) 500 MG tablet; TAKE 1 TABLET EVERY DAY  Dispense: 30 tablet; Refill: 3 - Hemoglobin A1c - Renal Function Panel - Microalbumin / creatinine urine ratio  Dr. [facility]  04/04/2018

## 2018-04-04 NOTE — Patient Instructions (Signed)
GUIDELINES FOR  LOW-CHOLESTEROL, LOW-TRIGLYCERIDE DIETS    FOODS TO USE   MEATS, FISH Choose lean meats (chicken, turkey, veal, and non-fatty cuts of beef with excess fat trimmed; one serving = 3 oz of cooked meat). Also, fresh or frozen fish, canned fish packed in water, and shellfish (lobster, crabs, shrimp, and oysters). Limit use to no more than one serving of one of these per week. Shellfish are high in cholesterol but low in saturated fat and should be used sparingly. Meats and fish should be broiled (pan or oven) or baked on a rack.  EGGS Egg substitutes and egg whites (use freely). Egg yolks (limit two per week).  FRUITS Eat three servings of fresh fruit per day (1 serving =  cup). Be sure to have at least one citrus fruit daily. Frozen and canned fruit with no sugar or syrup added may be used.  VEGETABLES Most vegetables are not limited (see next page). One dark-green (string beans, escarole) or one deep yellow (squash) vegetable is recommended daily. Cauliflower, broccoli, and celery, as well as potato skins, are recommended for their fiber content. (Fiber is associated with cholesterol reduction) It is preferable to steam vegetables, but they may be boiled, strained, or braised with polyunsaturated vegetable oil (see below).  BEANS Dried peas or beans (1 serving =  cup) may be used as a bread substitute.  NUTS Almonds, walnuts, and peanuts may be used sparingly  (1 serving = 1 Tablespoonful). Use pumpkin, sesame, or sunflower seeds.  BREADS, GRAINS One roll or one slice of whole grain or enriched bread may be used, or three soda crackers or four pieces of melba toast as a substitute. Spaghetti, rice or noodles ( cup) or  large ear of corn may be used as a bread substitute. In preparing these foods do not use butter or shortening, use soft margarine. Also use egg and sugar substitutes.  Choose high fiber grains, such as oats and whole wheat.  CEREALS Use  cup of hot cereal or  cup of  cold cereal per day. Add a sugar substitute if desired, with 99% fat free or skim milk.  MILK PRODUCTS Always use 99% fat free or skim milk, dairy products such as low fat cheeses (farmer's uncreamed diet cottage), low-fat yogurt, and powdered skim milk.  FATS, OILS Use soft (not stick) margarine; vegetable oils that are high in polyunsaturated fats (such as safflower, sunflower, soybean, corn, and cottonseed). Always refrigerate meat drippings to harden the fat and remove it before preparing gravies  DESSERTS, SNACKS Limit to two servings per day; substitute each serving for a bread/cereal serving: ice milk, water sherbet (1/4 cup); unflavored gelatin or gelatin flavored with sugar substitute (1/3 cup); pudding prepared with skim milk (1/2 cup); egg white souffls; unbuttered popcorn (1  cups). Substitute carob for chocolate.  BEVERAGES Fresh fruit juices (limit 4 oz per day); black coffee, plain or herbal teas; soft drinks with sugar substitutes; club soda, preferably salt-free; cocoa made with skim milk or nonfat dried milk and water (sugar substitute added if desired); clear broth. Alcohol: limit two servings per day (see second page).  MISCELLANEOUS  You may use the following freely: vinegar, spices, herbs, nonfat bouillon, mustard, Worcestershire sauce, soy sauce, flavoring essence.                  GUIDELINES FOR  LOW-CHOLESTEROL, LOW TRIGLYCERIDE DIETS    FOODS TO AVOID   MEATS, FISH Marbled beef, pork, bacon, sausage, and other pork products; fatty   fowl (duck, goose); skin and fat of turkey and chicken; processed meats; luncheon meats (salami, bologna); frankfurters and fast-food hamburgers (theyre loaded with fat); organ meats (kidneys, liver); canned fish packed in oil.  EGGS Limit egg yolks to two per week.   FRUITS Coconuts (rich in saturated fats).  VEGETABLES Avoid avocados. Starchy vegetables (potatoes, corn, lima beans, dried peas, beans) may be used only if  substitutes for a serving of bread or cereal. (Baked potato skin, however, is desirable for its fiber content.  BEANS Commercial baked beans with sugar and/or pork added.  NUTS Avoid nuts.  Limit peanuts and walnuts to one tablespoonful per day.  BREADS, GRAINS Any baked goods with shortening and/or sugar. Commercial mixes with dried eggs and whole milk. Avoid sweet rolls, doughnuts, breakfast pastries (Danish), and sweetened packaged cereals (the added sugar converts readily to triglycerides).  MILK PRODUCTS Whole milk and whole-milk packaged goods; cream; ice cream; whole-milk puddings, yogurt, or cheeses; nondairy cream substitutes.  FATS, OILS Butter, lard, animal fats, bacon drippings, gravies, cream sauces as well as palm and coconut oils. All these are high in saturated fats. Examine labels on cholesterol free products for hydrogenated fats. (These are oils that have been hardened into solids and in the process have become saturated.)  DESSERTS, SNACKS Fried snack foods like potato chips; chocolate; candies in general; jams, jellies, syrups; whole- milk puddings; ice cream and milk sherbets; hydrogenated peanut butter.  BEVERAGES Sugared fruit juices and soft drinks; cocoa made with whole milk and/or sugar. When using alcohol (1 oz liquor, 5 oz beer, or 2  oz dry table wine per serving), one serving must be substituted for one bread or cereal serving (limit, two servings of alcohol per day).   SPECIAL NOTES    1. Remember that even non-limited foods should be used in moderation. 2. While on a cholesterol-lowering diet, be sure to avoid animal fats and marbled meats. 3. 3. While on a triglyceride-lowering diet, be sure to avoid sweets and to control the amount of carbohydrates you eat (starchy foods such as flour, bread, potatoes).While on a tri-glyceride-lowering diet, be sure to avoid sweets 4. Buy a good low-fat cookbook, such as the one published by the American Heart Association. 5. Consult  your physician if you have any questions.               [facility] Low Glycemic Diet Plan   Low Glycemic Foods (20-49) Moderate Glycemic Foods (50-69) High Glycemic Foods (70-100)      Breakfast Creals Breakfast Cereals Breakfast Cereals  All Bran All-Bran Fruit'n Oats   Bran Buds Bran Chex   Cheerios Corn chex    Fiber One Oatmeal (not instant)   Just Right Mini-Wheats   Corn Flakes Cream of Wheat    Oat Bran Special K Swiss Muesli   Grape Nuts Grape Nut Flakes      Grits Nutri-Grain    Fruits and fruit juice: Fruits Puffed Rice Puffed Wheat    (Limit to 1-2 Servings per day) Banana (under-ride) Dates   Rice Chex Rice Krispies    Apples Apricots (fresh/dried)   Figs Grapes   Shredded Wheat Team    Blackberries Blueberries   Kiwi Mango   Total     Cherries Cranberries   Oranges Raisins     Peaches Pears    Fruits  Plums Prunes   Fruit Juices Pineapple Watermelon    Grapefruit Raspberries   Cranberry Juice Orange Juice   Banana (over-ripe)       Strawberries Tangerines      Apple Juice Grapefruit Juice   Beans and Legumes Beverages  Tomato Juice    Boston-type baked beans Sodas, sweet tea, pineapple juice   Canned pinto, kidney, or navy beans   Beans and Legumes (fresh-cooked) Green peas Vegetables  Black-eyed peas Butter Beans    Potato, baked, boiled, fried, mashed  Chick peas Lentils   Vegetables French fries  Green beans Lima beans   Beets Carrots   Canned or frozen corn  Kidney beans Navy beans   Sweet potato Yam   Parsnips  Pinto beans Snow peas   Corn on the cob Winter squash      Non-starchy vegetables Grains Breads  Asparagus, avocado, broccoli, cabbage Cornmeal Rice, [place]   Most breads (white and whole grain)  cauliflower, celery, cucumber, greens Rice, white Couscous   Bagels Bread sticks    lettuce, mushrooms, peppers, tomatoes  Bread stuffing Kaiser roll    okra, onions, spinach, summer squash Pasta Dinner rolls    Macaroni Pizza, cheese     Grains Ravioli, meat filled Spaghetti, white   Grains  Barley Bulgur    Rice, instant Tapioca, with milk    Rye Wild rice   Nuts    Cashews Macadamia   Candy and most cookies  Nuts and oils    Almonds, peanuts, sunflower seeds Snacks Snacks  hazelnuts, pecans, walnuts Chocolate Ice cream, lowfat   Donuts Corn chips    Oils that are liquid at room temperature Muffin Popcorn   Jelly beans Pretzels      Pastries  Dairy, fish, meat, soy, and eggs    Milk, skim Lowfat cheese    Restaurant and ethnic foods  Yogurt, lowfat, fruit sugar sweetened  Most Chinese food (sugar in stir fry    or wok sauce)  Lean red meat Fish    Teriyaki-style meats and vegetables  Skinless chicken and turkey, shellfish        Egg whites (up to 3 daily), Soy Products    Egg yolks (up to 7 or _____ per week)      

## 2018-04-05 LAB — LIPID PANEL
Chol/HDL Ratio: 3.2 ratio (ref 0.0–5.0)
Cholesterol, Total: 180 mg/dL (ref 100–199)
HDL: 56 mg/dL (ref >39)
LDL Calculated: 103 mg/dL — ABNORMAL HIGH (ref 0–99)
Triglycerides: 106 mg/dL (ref 0–149)
VLDL Cholesterol Cal: 21 mg/dL (ref 5–40)

## 2018-04-05 LAB — RENAL FUNCTION PANEL
Albumin: 4.5 g/dL (ref 3.5–4.7)
BUN/Creatinine Ratio: 12 (ref 10–24)
BUN: 16 mg/dL (ref 8–27)
CO2: 26 mmol/L (ref 20–29)
Calcium: 10.1 mg/dL (ref 8.6–10.2)
Chloride: 103 mmol/L (ref 96–106)
Creatinine, Ser: 1.3 mg/dL — ABNORMAL HIGH (ref 0.76–1.27)
GFR calc Af Amer: 57 mL/min/{1.73_m2} — ABNORMAL LOW (ref >59)
GFR calc non Af Amer: 49 mL/min/{1.73_m2} — ABNORMAL LOW (ref >59)
Glucose: 90 mg/dL (ref 65–99)
Phosphorus: 3.8 mg/dL (ref 2.5–4.5)
Potassium: 4.4 mmol/L (ref 3.5–5.2)
Sodium: 143 mmol/L (ref 134–144)

## 2018-04-05 LAB — HEMOGLOBIN A1C
Est. average glucose Bld gHb Est-mCnc: 105 mg/dL
Hgb A1c MFr Bld: 5.3 % (ref 4.8–5.6)

## 2018-04-05 LAB — MICROALBUMIN / CREATININE URINE RATIO
Creatinine, Urine: 152.4 mg/dL
Microalb/Creat Ratio: 2.3 mg/g creat (ref 0.0–30.0)
Microalbumin, Urine: 3.5 ug/mL

## 2018-06-01 DIAGNOSIS — E119 Type 2 diabetes mellitus without complications: Secondary | ICD-10-CM | POA: Diagnosis not present

## 2018-06-01 LAB — HM DIABETES EYE EXAM

## 2018-06-12 ENCOUNTER — Other Ambulatory Visit: Payer: Self-pay | Admitting: Family Medicine

## 2018-07-23 ENCOUNTER — Other Ambulatory Visit: Payer: Self-pay

## 2018-07-23 NOTE — Patient Outreach (Signed)
[facility]) Care Management  07/23/2018  Brandon Dillon. November 19, 1927 [id number]   Medication Adherence call to Brandon Dillon spoke with patient's wife he is due on Metformin 500 mg and Simvastatin 20 mg patient wants a 90 days supply she explain he has an appointment in April with doctor Carmin Muskrat with [facility] at [place] office she said they could not call in a 90 days supply until patient is seen in April, I ask if they could leave a note for patient to received a 90 days supply she said she did not have his chart,and told me to tell patient to write it on the papers that there  bringing back to the office visit,call patient's wife to let her know they need to bring papers back to the office on there next appointmet. Brandon Dillon is showing due under [place].   [place] Management Direct Dial [phone]  Fax [phone] [email or web address]

## 2018-08-06 ENCOUNTER — Telehealth: Payer: Self-pay | Admitting: Urology

## 2018-08-06 ENCOUNTER — Other Ambulatory Visit: Payer: Self-pay

## 2018-08-06 ENCOUNTER — Ambulatory Visit (INDEPENDENT_AMBULATORY_CARE_PROVIDER_SITE_OTHER): Payer: Medicare Other | Admitting: Family Medicine

## 2018-08-06 ENCOUNTER — Encounter: Payer: Self-pay | Admitting: Family Medicine

## 2018-08-06 VITALS — BP 120/68 | HR 80 | Ht 74.0 in | Wt 220.0 lb

## 2018-08-06 DIAGNOSIS — I10 Essential (primary) hypertension: Secondary | ICD-10-CM

## 2018-08-06 DIAGNOSIS — E782 Mixed hyperlipidemia: Secondary | ICD-10-CM

## 2018-08-06 DIAGNOSIS — R0989 Other specified symptoms and signs involving the circulatory and respiratory systems: Secondary | ICD-10-CM

## 2018-08-06 DIAGNOSIS — E119 Type 2 diabetes mellitus without complications: Secondary | ICD-10-CM | POA: Diagnosis not present

## 2018-08-06 MED ORDER — HYDROCHLOROTHIAZIDE 25 MG PO TABS: ORAL_TABLET | ORAL | 1 refills | Status: DC

## 2018-08-06 MED ORDER — METFORMIN HCL 500 MG PO TABS: ORAL_TABLET | ORAL | 1 refills | Status: DC

## 2018-08-06 MED ORDER — SIMVASTATIN 20 MG PO TABS: ORAL_TABLET | ORAL | 3 refills | Status: DC

## 2018-08-06 NOTE — Telephone Encounter (Signed)
[place] [id number] 08-06-18 THRU 08-06-2019  Brandon Dillon

## 2018-08-06 NOTE — Progress Notes (Signed)
Date:  08/06/2018   Name:  Brandon Dillon.   DOB:  1928-04-04   MRN:  [id number]   Chief Complaint: Hypertension; Hyperlipidemia; and Diabetes  Hypertension  This is a chronic problem. The current episode started more than 1 year ago. The problem is unchanged. The problem is controlled. Pertinent negatives include no anxiety, blurred vision, chest pain, headaches, malaise/fatigue, neck pain, orthopnea, palpitations, peripheral edema, PND, shortness of breath or sweats. Risk factors for coronary artery disease include dyslipidemia and diabetes mellitus. Past treatments include diuretics. The current treatment provides moderate improvement. There are no compliance problems.  There is no history of angina, kidney disease, CAD/MI, CVA, heart failure, left ventricular hypertrophy, PVD or retinopathy. There is no history of chronic renal disease, a hypertension causing med or renovascular disease.  Hyperlipidemia  This is a chronic problem. The current episode started more than 1 year ago. The problem is controlled. He has no history of chronic renal disease, diabetes, hypothyroidism, liver disease, obesity or nephrotic syndrome. Pertinent negatives include no chest pain, focal sensory loss, focal weakness, leg pain, myalgias or shortness of breath. Current antihyperlipidemic treatment includes statins. The current treatment provides significant improvement of lipids. There are no compliance problems.  There are no known risk factors for coronary artery disease.  Diabetes  He has type 2 diabetes mellitus. His disease course has been stable. Pertinent negatives for hypoglycemia include no confusion, dizziness, headaches, hunger, mood changes, nervousness/anxiousness, pallor, seizures, sleepiness, speech difficulty, sweats or tremors. Pertinent negatives for diabetes include no blurred vision, no chest pain, no fatigue, no foot paresthesias, no foot ulcerations, no polydipsia, no polyphagia, no polyuria,  no visual change, no weakness and no weight loss. There are no hypoglycemic complications. Diabetic complications include nephropathy. Pertinent negatives for diabetic complications include no autonomic neuropathy, CVA, heart disease, impotence, peripheral neuropathy, PVD or retinopathy. There are no known risk factors for coronary artery disease. Current diabetic treatment includes oral agent (monotherapy). He is compliant with treatment some of the time. His breakfast blood glucose is taken between 8-9 am.    Review of Systems  Constitutional: Negative for chills, fatigue, fever, malaise/fatigue and weight loss.  HENT: Negative for drooling, ear discharge, ear pain and sore throat.   Eyes: Negative for blurred vision.  Respiratory: Negative for cough, shortness of breath and wheezing.   Cardiovascular: Negative for chest pain, palpitations, orthopnea, leg swelling and PND.  Gastrointestinal: Negative for abdominal pain, blood in stool, constipation, diarrhea and nausea.  Endocrine: Negative for polydipsia, polyphagia and polyuria.  Genitourinary: Negative for dysuria, frequency, hematuria, impotence and urgency.  Musculoskeletal: Negative for back pain, myalgias and neck pain.  Skin: Negative for pallor and rash.  Allergic/Immunologic: Negative for environmental allergies.  Neurological: Negative for dizziness, tremors, focal weakness, seizures, speech difficulty, weakness and headaches.  Hematological: Does not bruise/bleed easily.  Psychiatric/Behavioral: Negative for confusion and suicidal ideas. The patient is not nervous/anxious.     Patient Active Problem List   Diagnosis Date Noted   Essential hypertension 05/11/2015   Type 2 diabetes mellitus without complication, without long-term current use of insulin ([place]) 05/11/2015   Primary prostate adenocarcinoma ([place]) 05/11/2015   Hyperlipidemia 05/11/2015   Prostate hypertrophy 05/11/2015   Taking multiple medications for chronic  disease 05/11/2015   Benign prostatic hyperplasia with urinary obstruction 12/07/2014   Elevated prostate specific antigen (PSA) 12/04/2014    Allergies  Allergen Reactions   Lisinopril     coughing coughing    Past Surgical History:  Procedure Laterality Date   CATARACT EXTRACTION W/PHACO Right 09/27/2017   Procedure: CATARACT EXTRACTION PHACO AND INTRAOCULAR LENS PLACEMENT ([place]) RIGHT;  Surgeon: Leandrew Koyanagi, MD;  Location: [place];  Service: Ophthalmology;  Laterality: Right;  IVA TOPICAL DIABETES   CATARACT EXTRACTION W/PHACO Left 10/18/2017   Procedure: CATARACT EXTRACTION PHACO AND INTRAOCULAR LENS PLACEMENT ([place])  LEFT DIABETIC;  Surgeon: Leandrew Koyanagi, MD;  Location: [facility];  Service: Ophthalmology;  Laterality: Left;  DIABETIC-ORAL MED   COLONOSCOPY  2011   normal- Dr?    Social History   Tobacco Use   Smoking status: Never Smoker   Smokeless tobacco: Never Used  Substance Use Topics   Alcohol use: No    Alcohol/week: 0.0 standard drinks   Drug use: No     Medication list has been reviewed and updated.  Current Meds  Medication Sig   aspirin 81 MG tablet Take 81 mg by mouth daily.   glucose blood (GE100 BLOOD GLUCOSE TEST) test strip 1 each by Other route daily. Use as instructed- Dx E11.9   hydrochlorothiazide (HYDRODIURIL) 25 MG tablet TAKE 1 TABLET EVERY DAY   Lancets (FREESTYLE) lancets AS DIRECTED   megestrol (MEGACE) 20 MG tablet Take 1 tablet (20 mg total) by mouth 2 (two) times daily as needed (Hot flashes). (Patient taking differently: Take 20 mg by mouth 2 (two) times daily as needed (Hot flashes). Dr Bernardo Heater)   metFORMIN (GLUCOPHAGE) 500 MG tablet TAKE 1 TABLET EVERY DAY   simvastatin (ZOCOR) 20 MG tablet TAKE 1 TABLET EVERY DAY/ PM    PHQ 2/9 Scores 08/06/2018 04/04/2018 11/02/2017 02/15/2017  PHQ - 2 Score 0 0 0 0  PHQ- 9 Score 0 0 - 0    BP Readings from Last 3 Encounters:  08/06/18 120/68   04/04/18 110/60  11/02/17 117/79    Physical Exam Vitals signs and nursing note reviewed.  HENT:     Head: Normocephalic.     Right Ear: External ear normal.     Left Ear: External ear normal.     Nose: Nose normal. No congestion or rhinorrhea.  Eyes:     General: No scleral icterus.       Right eye: No discharge.        Left eye: No discharge.     Conjunctiva/sclera: Conjunctivae normal.     Pupils: Pupils are equal, round, and reactive to light.  Neck:     Musculoskeletal: Normal range of motion and neck supple.     Thyroid: No thyromegaly.     Vascular: No JVD.     Trachea: No tracheal deviation.  Cardiovascular:     Rate and Rhythm: Normal rate and regular rhythm.     Chest Wall: PMI is not displaced. No thrill.     Pulses:          Femoral pulses are 1+ on the right side and 1+ on the left side.      Dorsalis pedis pulses are 0 on the right side and 0 on the left side.       Posterior tibial pulses are 0 on the right side and 0 on the left side.     Heart sounds: Normal heart sounds, S1 normal and S2 normal. Heart sounds not distant. No murmur. No systolic murmur. No diastolic murmur. No friction rub. No gallop. No S3 or S4 sounds.   Pulmonary:     Effort: No respiratory distress.     Breath sounds: Normal breath sounds.  No wheezing or rales.  Abdominal:     General: Bowel sounds are normal.     Palpations: Abdomen is soft. There is no mass.     Tenderness: There is no abdominal tenderness. There is no guarding or rebound.  Musculoskeletal: Normal range of motion.        General: No tenderness.     Right lower leg: No edema.     Left lower leg: No edema.  Feet:     Right foot:     Protective Sensation: 10 sites tested. 10 sites sensed.     Skin integrity: Callus and dry skin present.     Toenail Condition: Right toenails are abnormally thick.     Left foot:     Protective Sensation: 10 sites tested. 10 sites sensed.     Skin integrity: Callus and dry skin  present.     Toenail Condition: Left toenails are abnormally thick.  Lymphadenopathy:     Cervical: No cervical adenopathy.  Skin:    General: Skin is warm.     Capillary Refill: Capillary refill takes less than 2 seconds.     Findings: No rash.  Neurological:     Mental Status: He is alert and oriented to person, place, and time.     Cranial Nerves: No cranial nerve deficit.     Deep Tendon Reflexes: Reflexes are normal and symmetric.     Wt Readings from Last 3 Encounters:  08/06/18 220 lb (99.8 kg)  04/04/18 210 lb (95.3 kg)  11/02/17 213 lb (96.6 kg)    BP 120/68    Pulse 80    Ht 6\' 2"  (1.88 m)    Wt 220 lb (99.8 kg)    BMI 28.25 kg/m   Assessment and Plan: 1. Type 2 diabetes mellitus without complication, without long-term current use of insulin (HCC) Chronic.  Stable.  Will continue metformin 500 mg once a day will check A1c and renal panel.  See concern for PAD below - HgB A1c - metFORMIN (GLUCOPHAGE) 500 MG tablet; TAKE 1 TABLET EVERY DAY  Dispense: 90 tablet; Refill: 1 - Renal function panel - Ambulatory referral to Vascular Surgery  2. Essential hypertension Chronic.  Controlled.  Continue hydrochlorothiazide 25 mg and will access electrolyte status with renal function panel. - hydrochlorothiazide (HYDRODIURIL) 25 MG tablet; TAKE 1 TABLET EVERY DAY  Dispense: 90 tablet; Refill: 1 - Renal function panel  3. Mixed hyperlipidemia Chronic.  Controlled.  Continue simvastatin 20 mg once a day and will recheck lipid panel. - simvastatin (ZOCOR) 20 MG tablet; TAKE 1 TABLET EVERY DAY/ PM  Dispense: 30 tablet; Refill: 3  4. Decreased pedal pulses Patient was noted on exam to have decreased pedal pulses.  There was no cyanosis nor coolness to the feet but was unable to to match pulses with femoral pulses noted to be in the 1-2+ range will refer to vascular for evaluation for possible PAD. - Ambulatory referral to Vascular Surgery

## 2018-08-07 LAB — RENAL FUNCTION PANEL
Albumin: 4.4 g/dL (ref 3.6–4.6)
BUN/Creatinine Ratio: 13 (ref 10–24)
BUN: 16 mg/dL (ref 8–27)
CO2: 24 mmol/L (ref 20–29)
Calcium: 9.7 mg/dL (ref 8.6–10.2)
Chloride: 102 mmol/L (ref 96–106)
Creatinine, Ser: 1.23 mg/dL (ref 0.76–1.27)
GFR calc Af Amer: 60 mL/min/{1.73_m2} (ref >59)
GFR calc non Af Amer: 52 mL/min/{1.73_m2} — ABNORMAL LOW (ref >59)
Glucose: 158 mg/dL — ABNORMAL HIGH (ref 65–99)
Phosphorus: 2.9 mg/dL (ref 2.8–4.1)
Potassium: 4.2 mmol/L (ref 3.5–5.2)
Sodium: 142 mmol/L (ref 134–144)

## 2018-08-07 LAB — HEMOGLOBIN A1C
Est. average glucose Bld gHb Est-mCnc: 100 mg/dL
Hgb A1c MFr Bld: 5.1 % (ref 4.8–5.6)

## 2018-08-13 ENCOUNTER — Other Ambulatory Visit (INDEPENDENT_AMBULATORY_CARE_PROVIDER_SITE_OTHER): Payer: Self-pay | Admitting: Vascular Surgery

## 2018-08-13 DIAGNOSIS — R0989 Other specified symptoms and signs involving the circulatory and respiratory systems: Secondary | ICD-10-CM

## 2018-08-14 ENCOUNTER — Other Ambulatory Visit: Payer: Self-pay

## 2018-08-14 ENCOUNTER — Ambulatory Visit (INDEPENDENT_AMBULATORY_CARE_PROVIDER_SITE_OTHER): Payer: Medicare Other

## 2018-08-14 VITALS — BP 134/74 | HR 90 | Ht 74.0 in | Wt 220.0 lb

## 2018-08-14 DIAGNOSIS — C61 Malignant neoplasm of prostate: Secondary | ICD-10-CM | POA: Diagnosis not present

## 2018-08-14 MED ORDER — LEUPROLIDE ACETATE (6 MONTH) 45 MG IM KIT
45.0000 mg | PACK | Freq: Once | INTRAMUSCULAR | Status: AC
  Administered 2018-08-14: 45 mg via INTRAMUSCULAR

## 2018-08-14 NOTE — Progress Notes (Signed)
Lupron IM Injection   Due to Prostate Cancer patient is present today for a Lupron Injection.  Medication: Lupron 6 month Dose: 45 mg  Location: left upper outer buttocks Lot: [id number] Exp: 09/24/20  Patient tolerated well, no complications were noted  Performed by: Shawnie Dapper, CMA  Follow up: 6 month after Feb 11, 2019

## 2018-08-14 NOTE — Patient Instructions (Signed)
Continue taking Vitamin D and Calcium Leuprolide injection What is this medicine? LEUPROLIDE (loo PROE lide) is a man-made hormone. It is used to treat the symptoms of prostate cancer. This medicine may also be used to treat children with early onset of puberty. It may be used for other hormonal conditions. This medicine may be used for other purposes; ask your health care provider or pharmacist if you have questions. COMMON BRAND NAME(S): Lupron What should I tell my health care provider before I take this medicine? They need to know if you have any of these conditions: -diabetes -heart disease or previous heart attack -high blood pressure -high cholesterol -pain or difficulty passing urine -spinal cord metastasis -stroke -tobacco smoker -an unusual or allergic reaction to leuprolide, benzyl alcohol, other medicines, foods, dyes, or preservatives -pregnant or trying to get pregnant -breast-feeding How should I use this medicine? This medicine is for injection under the skin or into a muscle. You will be taught how to prepare and give this medicine. Use exactly as directed. Take your medicine at regular intervals. Do not take your medicine more often than directed. It is important that you put your used needles and syringes in a special sharps container. Do not put them in a trash can. If you do not have a sharps container, call your pharmacist or healthcare provider to get one. A special MedGuide will be given to you by the pharmacist with each prescription and refill. Be sure to read this information carefully each time. Talk to your pediatrician regarding the use of this medicine in children. While this medicine may be prescribed for children as young as 8 years for selected conditions, precautions do apply. Overdosage: If you think you have taken too much of this medicine contact a poison control center or emergency room at once. NOTE: This medicine is only for you. Do not share this  medicine with others. What if I miss a dose? If you miss a dose, take it as soon as you can. If it is almost time for your next dose, take only that dose. Do not take double or extra doses. What may interact with this medicine? Do not take this medicine with any of the following medications: -chasteberry This medicine may also interact with the following medications: -herbal or dietary supplements, like black cohosh or DHEA -male hormones, like estrogens or progestins and birth control pills, patches, rings, or injections -male hormones, like testosterone This list may not describe all possible interactions. Give your health care provider a list of all the medicines, herbs, non-prescription drugs, or dietary supplements you use. Also tell them if you smoke, drink alcohol, or use illegal drugs. Some items may interact with your medicine. What should I watch for while using this medicine? Visit your doctor or health care professional for regular checks on your progress. During the first week, your symptoms may get worse, but then will improve as you continue your treatment. You may get hot flashes, increased bone pain, increased difficulty passing urine, or an aggravation of nerve symptoms. Discuss these effects with your doctor or health care professional, some of them may improve with continued use of this medicine. Male patients may experience a menstrual cycle or spotting during the first 2 months of therapy with this medicine. If this continues, contact your doctor or health care professional. What side effects may I notice from receiving this medicine? Side effects that you should report to your doctor or health care professional as soon as possible: -allergic  reactions like skin rash, itching or hives, swelling of the face, lips, or tongue -breathing problems -chest pain -depression or memory disorders -pain in your legs or groin -pain at site where injected -severe headache -swelling  of the feet and legs -visual changes -vomiting Side effects that usually do not require medical attention (report to your doctor or health care professional if they continue or are bothersome): -breast swelling or tenderness -decrease in sex drive or performance -diarrhea -hot flashes -loss of appetite -muscle, joint, or bone pains -nausea -redness or irritation at site where injected -skin problems or acne This list may not describe all possible side effects. Call your doctor for medical advice about side effects. You may report side effects to FDA at 1-800-FDA-1088. Where should I keep my medicine? Keep out of the reach of children. Store below 25 degrees C (77 degrees F). Do not freeze. Protect from light. Do not use if it is not clear or if there are particles present. Throw away any unused medicine after the expiration date. NOTE: This sheet is a summary. It may not cover all possible information. If you have questions about this medicine, talk to your doctor, pharmacist, or health care provider.  2019 Elsevier/Gold Standard (2015-11-30 10:54:35)

## 2018-08-16 ENCOUNTER — Ambulatory Visit (INDEPENDENT_AMBULATORY_CARE_PROVIDER_SITE_OTHER): Payer: Medicare Other | Admitting: Vascular Surgery

## 2018-08-16 ENCOUNTER — Encounter (INDEPENDENT_AMBULATORY_CARE_PROVIDER_SITE_OTHER): Payer: Self-pay | Admitting: Vascular Surgery

## 2018-08-16 ENCOUNTER — Ambulatory Visit (INDEPENDENT_AMBULATORY_CARE_PROVIDER_SITE_OTHER): Payer: Medicare Other

## 2018-08-16 ENCOUNTER — Other Ambulatory Visit: Payer: Self-pay

## 2018-08-16 VITALS — BP 137/66 | HR 81 | Resp 16 | Ht 74.0 in | Wt 222.0 lb

## 2018-08-16 DIAGNOSIS — M79604 Pain in right leg: Secondary | ICD-10-CM

## 2018-08-16 DIAGNOSIS — Z7984 Long term (current) use of oral hypoglycemic drugs: Secondary | ICD-10-CM

## 2018-08-16 DIAGNOSIS — M1990 Unspecified osteoarthritis, unspecified site: Secondary | ICD-10-CM | POA: Insufficient documentation

## 2018-08-16 DIAGNOSIS — M150 Primary generalized (osteo)arthritis: Secondary | ICD-10-CM | POA: Diagnosis not present

## 2018-08-16 DIAGNOSIS — I739 Peripheral vascular disease, unspecified: Secondary | ICD-10-CM

## 2018-08-16 DIAGNOSIS — E119 Type 2 diabetes mellitus without complications: Secondary | ICD-10-CM

## 2018-08-16 DIAGNOSIS — I10 Essential (primary) hypertension: Secondary | ICD-10-CM | POA: Diagnosis not present

## 2018-08-16 DIAGNOSIS — R0989 Other specified symptoms and signs involving the circulatory and respiratory systems: Secondary | ICD-10-CM | POA: Diagnosis not present

## 2018-08-16 DIAGNOSIS — M79606 Pain in leg, unspecified: Secondary | ICD-10-CM | POA: Insufficient documentation

## 2018-08-16 DIAGNOSIS — M159 Polyosteoarthritis, unspecified: Secondary | ICD-10-CM

## 2018-08-16 DIAGNOSIS — M8949 Other hypertrophic osteoarthropathy, multiple sites: Secondary | ICD-10-CM

## 2018-08-16 DIAGNOSIS — Z79899 Other long term (current) drug therapy: Secondary | ICD-10-CM

## 2018-08-16 DIAGNOSIS — Z7902 Long term (current) use of antithrombotics/antiplatelets: Secondary | ICD-10-CM

## 2018-08-16 NOTE — Progress Notes (Signed)
MRN : [id number]  Brandon Dillon. is a 85 y.o. (11-18-1927) male who presents with chief complaint of  Chief Complaint  Patient presents with  . New Patient (Initial Visit)    ref jones for eval for PAD  .  History of Present Illness:   The patient is seen for evaluation of painful lower extremities. Patient notes the pain is variable and not always associated with activity.  The pain is somewhat consistent day to day occurring on most days. The patient notes the pain also occurs with standing and routinely seems worse as the day wears on. He notes that sitting in a car causes his right leg to hurt and even riding on his mower makes the right leg hurt.  His right leg gives him trouble when he is in [place] but it varies time to time.The pain has been progressive over the past several years. The patient states these symptoms are causing  a profound negative impact on quality of life and daily activities.  The patient denies rest pain or dangling of an extremity off the side of the bed during the night for relief. No open wounds or sores at this time. No history of DVT or phlebitis. No prior interventions or surgeries.  There is a  history of back problems and DJD of the lumbar and sacral spine.   ABI done today Rt=0.83 and Lt=1.08   Current Meds  Medication Sig  . aspirin 81 MG tablet Take 81 mg by mouth daily.  Marland Kitchen glucose blood (GE100 BLOOD GLUCOSE TEST) test strip 1 each by Other route daily. Use as instructed- Dx E11.9  . hydrochlorothiazide (HYDRODIURIL) 25 MG tablet TAKE 1 TABLET EVERY DAY  . Lancets (FREESTYLE) lancets AS DIRECTED  . megestrol (MEGACE) 20 MG tablet Take 1 tablet (20 mg total) by mouth 2 (two) times daily as needed (Hot flashes). (Patient taking differently: Take 20 mg by mouth 2 (two) times daily as needed (Hot flashes). Dr Bernardo Heater)  . metFORMIN (GLUCOPHAGE) 500 MG tablet TAKE 1 TABLET EVERY DAY  . simvastatin (ZOCOR) 20 MG tablet TAKE 1 TABLET EVERY DAY/ PM     Past Medical History:  Diagnosis Date  . Diabetes mellitus without complication ([place])   . Hearing loss   . Hx of fracture of wrist 07/20/2017   >30 years ago. <1989  . Hyperlipidemia   . Hypertension   . Prostate cancer [facility])     Past Surgical History:  Procedure Laterality Date  . CATARACT EXTRACTION W/PHACO Right 09/27/2017   Procedure: CATARACT EXTRACTION PHACO AND INTRAOCULAR LENS PLACEMENT ([place]) RIGHT;  Surgeon: Leandrew Koyanagi, MD;  Location: [place];  Service: Ophthalmology;  Laterality: Right;  IVA TOPICAL DIABETES  . CATARACT EXTRACTION W/PHACO Left 10/18/2017   Procedure: CATARACT EXTRACTION PHACO AND INTRAOCULAR LENS PLACEMENT ([place])  LEFT DIABETIC;  Surgeon: Leandrew Koyanagi, MD;  Location: [place];  Service: Ophthalmology;  Laterality: Left;  DIABETIC-ORAL MED  . COLONOSCOPY  2011   normal- Dr?    Social History Social History   Tobacco Use  . Smoking status: Never Smoker  . Smokeless tobacco: Never Used  Substance Use Topics  . Alcohol use: No    Alcohol/week: 0.0 standard drinks  . Drug use: No    Family History Family History  Family history unknown: Yes  No family history of bleeding/clotting disorders, porphyria or autoimmune disease   Allergies  Allergen Reactions  . Lisinopril     coughing coughing  REVIEW OF SYSTEMS (Negative unless checked)  Constitutional: [] Weight loss  [] Fever  [] Chills Cardiac: [] Chest pain   [] Chest pressure   [] Palpitations   [] Shortness of breath when laying flat   [] Shortness of breath with exertion. Vascular:  [x] Pain in legs with walking   [x] Pain in legs at rest  [] History of DVT   [] Phlebitis   [] Swelling in legs   [] Varicose veins   [] Non-healing ulcers Pulmonary:   [] Uses home oxygen   [] Productive cough   [] Hemoptysis   [] Wheeze  [] COPD   [] Asthma Neurologic:  [] Dizziness   [] Seizures   [] History of stroke   [] History of TIA  [] Aphasia   [] Vissual changes   [] Weakness or  numbness in arm   [] Weakness or numbness in leg Musculoskeletal:   [] Joint swelling   [] Joint pain   [] Low back pain Hematologic:  [] Easy bruising  [] Easy bleeding   [] Hypercoagulable state   [] Anemic Gastrointestinal:  [] Diarrhea   [] Vomiting  [] Gastroesophageal reflux/heartburn   [] Difficulty swallowing. Genitourinary:  [] Chronic kidney disease   [] Difficult urination  [] Frequent urination   [] Blood in urine Skin:  [] Rashes   [] Ulcers  Psychological:  [] History of anxiety   []  History of major depression.  Physical Examination  Vitals:   08/16/18 1035  BP: 137/66  Pulse: 81  Resp: 16  Weight: 222 lb (100.7 kg)  Height: 6\' 2"  (1.88 m)   Body mass index is 28.5 kg/m. Gen: WD/WN, NAD Head: [place]/AT, No temporalis wasting.  Ear/Nose/Throat: Hearing grossly intact, nares w/o erythema or drainage, poor dentition Eyes: PER, EOMI, sclera nonicteric.  Neck: Supple, no masses.  No bruit or JVD.  Pulmonary:  Good air movement, clear to auscultation bilaterally, no use of accessory muscles.  Cardiac: RRR, normal S1, S2, no Murmurs. Vascular:  Vessel Right Left  Radial Palpable Palpable  PT Not Palpable Not Palpable  DP Not Palpable 1+ Palpable   Gastrointestinal: soft, non-distended. No guarding/no peritoneal signs.  Musculoskeletal: M/S 5/5 throughout.  No deformity or atrophy.  Neurologic: CN 2-12 intact. Pain and light touch intact in extremities.  Symmetrical.  Speech is fluent. Motor exam as listed above. Psychiatric: Judgment intact, Mood & affect appropriate for pt's clinical situation. Dermatologic: No rashes or ulcers noted.  No changes consistent with cellulitis. Lymph : No Cervical lymphadenopathy, no lichenification or skin changes of chronic lymphedema.  CBC No results found for: WBC, HGB, HCT, MCV, PLT  BMET    Component Value Date/Time   NA 142 08/06/2018 1111   K 4.2 08/06/2018 1111   CL 102 08/06/2018 1111   CO2 24 08/06/2018 1111   GLUCOSE 158 (H) 08/06/2018  1111   BUN 16 08/06/2018 1111   CREATININE 1.23 08/06/2018 1111   CALCIUM 9.7 08/06/2018 1111   GFRNONAA 52 (L) 08/06/2018 1111   GFRAA 60 08/06/2018 1111   Estimated Creatinine Clearance: 52.6 mL/min (by C-G formula based on SCr of 1.23 mg/dL).  COAG No results found for: INR, PROTIME  Radiology No results found.   Assessment/Plan 1. Pain of right lower extremity Recommend:  I do not find evidence of Vascular pathology that would completely and exclusively explain the patient's symptoms.  The patient has atypical pain symptoms for vascular disease  Noninvasive studies show mild disease on the right and a normal study on the left  The patient should continue walking and begin a more formal exercise program.  The patient should continue his antiplatelet therapy and aggressive treatment of the lipid abnormalities.  Further work-up of  his lower extremity pain is deferred to the primary service given the likely degenerative etiology    2. PAD (peripheral artery disease) (HCC)  Recommend:  The patient has evidence of atherosclerosis of the lower extremities with claudication.  The patient does not voice lifestyle limiting changes at this point in time.  Noninvasive studies do not suggest clinically significant change.  No invasive studies, angiography or surgery at this time The patient should continue walking and begin a more formal exercise program.  The patient should continue antiplatelet therapy and aggressive treatment of the lipid abnormalities  No changes in the patient's medications at this time  - VAS [place] ABI WITH/WO TBI; Future  3. Primary osteoarthritis involving multiple joints Continue NSAID medications as already ordered, these medications have been reviewed and there are no changes at this time.  Continued activity and therapy was stressed.   4. Essential hypertension Continue antihypertensive medications as already ordered, these medications have been  reviewed and there are no changes at this time.   5. Type 2 diabetes mellitus without complication, without long-term current use of insulin (HCC) Continue hypoglycemic medications as already ordered, these medications have been reviewed and there are no changes at this time.  Hgb A1C to be monitored as already arranged by primary service     Hortencia Pilar, MD  08/16/2018 12:25 PM

## 2018-09-08 DIAGNOSIS — I6529 Occlusion and stenosis of unspecified carotid artery: Secondary | ICD-10-CM | POA: Diagnosis not present

## 2018-09-08 DIAGNOSIS — E119 Type 2 diabetes mellitus without complications: Secondary | ICD-10-CM | POA: Diagnosis not present

## 2018-09-08 DIAGNOSIS — R42 Dizziness and giddiness: Secondary | ICD-10-CM | POA: Diagnosis not present

## 2018-09-08 DIAGNOSIS — Z7982 Long term (current) use of aspirin: Secondary | ICD-10-CM | POA: Diagnosis not present

## 2018-09-08 DIAGNOSIS — E785 Hyperlipidemia, unspecified: Secondary | ICD-10-CM | POA: Diagnosis not present

## 2018-09-08 DIAGNOSIS — I739 Peripheral vascular disease, unspecified: Secondary | ICD-10-CM | POA: Diagnosis not present

## 2018-09-08 DIAGNOSIS — I10 Essential (primary) hypertension: Secondary | ICD-10-CM | POA: Diagnosis not present

## 2018-09-14 ENCOUNTER — Encounter: Payer: Self-pay | Admitting: Family Medicine

## 2018-09-14 ENCOUNTER — Ambulatory Visit (INDEPENDENT_AMBULATORY_CARE_PROVIDER_SITE_OTHER): Payer: Medicare Other | Admitting: Family Medicine

## 2018-09-14 ENCOUNTER — Other Ambulatory Visit: Payer: Self-pay

## 2018-09-14 VITALS — BP 120/60 | HR 80 | Ht 74.0 in | Wt 222.0 lb

## 2018-09-14 DIAGNOSIS — R42 Dizziness and giddiness: Secondary | ICD-10-CM | POA: Diagnosis not present

## 2018-09-14 DIAGNOSIS — I6522 Occlusion and stenosis of left carotid artery: Secondary | ICD-10-CM

## 2018-09-14 DIAGNOSIS — G508 Other disorders of trigeminal nerve: Secondary | ICD-10-CM

## 2018-09-14 DIAGNOSIS — E860 Dehydration: Secondary | ICD-10-CM

## 2018-09-14 DIAGNOSIS — E119 Type 2 diabetes mellitus without complications: Secondary | ICD-10-CM | POA: Diagnosis not present

## 2018-09-14 MED ORDER — GABAPENTIN 100 MG PO CAPS
100.0000 mg | ORAL_CAPSULE | Freq: Two times a day (BID) | ORAL | 0 refills | Status: DC
Start: 2018-09-14 — End: 2018-10-11

## 2018-09-14 NOTE — Progress Notes (Signed)
Date:  09/14/2018   Name:  Brandon Dillon.   DOB:  02-Mar-1928   MRN:  [id number]   Chief Complaint: Follow-up (ER- started approx 2 weeks ago with a tingling above L) eye and goes half way up the head.)  Neurologic Problem  The patient's primary symptoms include focal sensory loss. The patient's pertinent negatives include no altered mental status, clumsiness, focal weakness, loss of balance, memory loss, near-syncope, slurred speech, syncope, visual change or weakness. Primary symptoms comment: left anterior scalp. This is a new problem. The current episode started in the past 7 days. The problem has been waxing and waning (comes and goes) since onset. There was left-sided and facial focality noted. Associated symptoms include dizziness and light-headedness. Pertinent negatives include no abdominal pain, auditory change, back pain, bladder incontinence, bowel incontinence, chest pain, confusion, diaphoresis, fatigue, fever, headaches, nausea, neck pain, palpitations, shortness of breath, vertigo or vomiting. Past treatments include nothing.    Review of Systems  Constitutional: Negative for chills, diaphoresis, fatigue and fever.  HENT: Negative for drooling, ear discharge, ear pain and sore throat.   Respiratory: Negative for cough, shortness of breath and wheezing.   Cardiovascular: Negative for chest pain, palpitations, leg swelling and near-syncope.  Gastrointestinal: Negative for abdominal pain, blood in stool, bowel incontinence, constipation, diarrhea, nausea and vomiting.  Endocrine: Negative for polydipsia.  Genitourinary: Negative for bladder incontinence, dysuria, frequency, hematuria and urgency.  Musculoskeletal: Negative for back pain, myalgias and neck pain.  Skin: Negative for rash.  Allergic/Immunologic: Negative for environmental allergies.  Neurological: Positive for dizziness and light-headedness. Negative for vertigo, focal weakness, syncope, weakness, headaches and  loss of balance.  Hematological: Does not bruise/bleed easily.  Psychiatric/Behavioral: Negative for confusion, memory loss and suicidal ideas. The patient is not nervous/anxious.     Patient Active Problem List   Diagnosis Date Noted  . Leg pain 08/16/2018  . PAD (peripheral artery disease) ([place]) 08/16/2018  . DJD (degenerative joint disease) 08/16/2018  . Essential hypertension 05/11/2015  . Type 2 diabetes mellitus without complication, without long-term current use of insulin ([place]) 05/11/2015  . Primary prostate adenocarcinoma ([place]) 05/11/2015  . Hyperlipidemia 05/11/2015  . Prostate hypertrophy 05/11/2015  . Taking multiple medications for chronic disease 05/11/2015  . Benign prostatic hyperplasia with urinary obstruction 12/07/2014  . Elevated prostate specific antigen (PSA) 12/04/2014    Allergies  Allergen Reactions  . Lisinopril     coughing coughing    Past Surgical History:  Procedure Laterality Date  . CATARACT EXTRACTION W/PHACO Right 09/27/2017   Procedure: CATARACT EXTRACTION PHACO AND INTRAOCULAR LENS PLACEMENT ([place]) RIGHT;  Surgeon: Leandrew Koyanagi, MD;  Location: [place];  Service: Ophthalmology;  Laterality: Right;  IVA TOPICAL DIABETES  . CATARACT EXTRACTION W/PHACO Left 10/18/2017   Procedure: CATARACT EXTRACTION PHACO AND INTRAOCULAR LENS PLACEMENT ([place])  LEFT DIABETIC;  Surgeon: Leandrew Koyanagi, MD;  Location: [place];  Service: Ophthalmology;  Laterality: Left;  DIABETIC-ORAL MED  . COLONOSCOPY  2011   normal- Dr?    Social History   Tobacco Use  . Smoking status: Never Smoker  . Smokeless tobacco: Never Used  Substance Use Topics  . Alcohol use: No    Alcohol/week: 0.0 standard drinks  . Drug use: No     Medication list has been reviewed and updated.  Current Meds  Medication Sig  . aspirin 81 MG tablet Take 81 mg by mouth daily.  Marland Kitchen glucose blood (GE100 BLOOD GLUCOSE TEST) test strip 1 each  by Other route  daily. Use as instructed- Dx E11.9  . hydrochlorothiazide (HYDRODIURIL) 25 MG tablet TAKE 1 TABLET EVERY DAY  . Lancets (FREESTYLE) lancets AS DIRECTED  . megestrol (MEGACE) 20 MG tablet Take 1 tablet (20 mg total) by mouth 2 (two) times daily as needed (Hot flashes). (Patient taking differently: Take 20 mg by mouth 2 (two) times daily as needed (Hot flashes). Dr Bernardo Heater)  . metFORMIN (GLUCOPHAGE) 500 MG tablet TAKE 1 TABLET EVERY DAY  . simvastatin (ZOCOR) 20 MG tablet TAKE 1 TABLET EVERY DAY/ PM    PHQ 2/9 Scores 08/06/2018 04/04/2018 11/02/2017 02/15/2017  PHQ - 2 Score 0 0 0 0  PHQ- 9 Score 0 0 - 0    BP Readings from Last 3 Encounters:  09/14/18 120/60  08/16/18 137/66  08/14/18 134/74    Physical Exam Vitals signs and nursing note reviewed.  HENT:     Head: Normocephalic.     Right Ear: Tympanic membrane, ear canal and external ear normal.     Left Ear: Tympanic membrane, ear canal and external ear normal.     Nose: Nose normal.     Mouth/Throat:     Mouth: Mucous membranes are dry.  Eyes:     General: No visual field deficit or scleral icterus.       Right eye: No discharge.        Left eye: No discharge.     Conjunctiva/sclera: Conjunctivae normal.     Pupils: Pupils are equal, round, and reactive to light.  Neck:     Musculoskeletal: Normal range of motion and neck supple.     Thyroid: No thyromegaly.     Vascular: No JVD.     Trachea: No tracheal deviation.  Cardiovascular:     Rate and Rhythm: Normal rate and regular rhythm.     Heart sounds: Normal heart sounds. No murmur. No friction rub. No gallop.   Pulmonary:     Effort: No respiratory distress.     Breath sounds: Normal breath sounds. No wheezing or rales.  Abdominal:     General: Bowel sounds are normal.     Palpations: Abdomen is soft. There is no mass.     Tenderness: There is no abdominal tenderness. There is no guarding or rebound.  Musculoskeletal: Normal range of motion.        General: No  tenderness.  Lymphadenopathy:     Cervical: No cervical adenopathy.  Skin:    General: Skin is warm.     Findings: No rash.  Neurological:     Mental Status: He is alert and oriented to person, place, and time.     Cranial Nerves: Cranial nerves are intact. No cranial nerve deficit, dysarthria or facial asymmetry.     Sensory: Sensation is intact. No sensory deficit.     Motor: Motor function is intact. No weakness.     Deep Tendon Reflexes: Reflexes are normal and symmetric.     Wt Readings from Last 3 Encounters:  09/14/18 222 lb (100.7 kg)  08/16/18 222 lb (100.7 kg)  08/14/18 220 lb (99.8 kg)    BP 120/60   Pulse 80   Ht 6\' 2"  (1.88 m)   Wt 222 lb (100.7 kg)   BMI 28.50 kg/m   Assessment and Plan: 1. Orthostatic dizziness Patient blood pressure is stable however if he does get a little dizzy when he stands up abruptly.  This is symptomatic orthostatic dizziness and patient has been cautioned about sudden  changes in standing or sitting.  2. Trigeminal neuritis Patient is having a little probable cutaneous irritation of her of her nerve of the left scalp.  May be a little inflammatory will use a little bit of gabapentin just to see over the course of the month twice a day 100 mg to see if this resolves or if medication may help. - gabapentin (NEURONTIN) 100 MG capsule; Take 1 capsule (100 mg total) by mouth 2 (two) times daily.  Dispense: 60 capsule; Refill: 0  3. Dehydration Patient is still dry with tongue being decreased moisture he is not been encouraged to continue fluids at this point in time recheck in a month  4. Type 2 diabetes mellitus without complication, without long-term current use of insulin (HCC) Patient has had consistently normal A1c's in the 5 range we will continue the discontinuance of the metformin and recheck in a month  5. Atherosclerosis of left carotid artery There is some mild atherosclerosis of the left  carotid artery and patient was  encouraged to take his aspirin.

## 2018-10-11 ENCOUNTER — Other Ambulatory Visit: Payer: Self-pay | Admitting: Family Medicine

## 2018-10-11 DIAGNOSIS — G508 Other disorders of trigeminal nerve: Secondary | ICD-10-CM

## 2018-10-17 ENCOUNTER — Ambulatory Visit (INDEPENDENT_AMBULATORY_CARE_PROVIDER_SITE_OTHER): Payer: Medicare Other | Admitting: Family Medicine

## 2018-10-17 ENCOUNTER — Other Ambulatory Visit: Payer: Self-pay

## 2018-10-17 ENCOUNTER — Encounter: Payer: Self-pay | Admitting: Family Medicine

## 2018-10-17 VITALS — BP 120/60 | HR 80 | Ht 74.0 in | Wt 228.0 lb

## 2018-10-17 DIAGNOSIS — R740 Nonspecific elevation of levels of transaminase and lactic acid dehydrogenase [LDH]: Secondary | ICD-10-CM

## 2018-10-17 DIAGNOSIS — E782 Mixed hyperlipidemia: Secondary | ICD-10-CM | POA: Diagnosis not present

## 2018-10-17 DIAGNOSIS — R7402 Elevation of levels of lactic acid dehydrogenase (LDH): Secondary | ICD-10-CM

## 2018-10-17 NOTE — Progress Notes (Signed)
Date:  10/17/2018   Name:  Brandon Dillon.   DOB:  1927-07-13   MRN:  [id number]   Chief Complaint: Diabetes (after taking off metformin due to 5.1 A1C- 112 BS this am), Hyperlipidemia (recheck lipid off of simvastatin), and Hypertension (needs refill HCTZ)  Hypertension This is a chronic problem. The problem is unchanged. The problem is controlled. Pertinent negatives include no anxiety, blurred vision, chest pain, headaches, malaise/fatigue, neck pain, orthopnea, palpitations, peripheral edema, PND, shortness of breath or sweats. There are no associated agents to hypertension. There are no known risk factors for coronary artery disease. Past treatments include diuretics. The current treatment provides moderate improvement. There are no compliance problems.  There is no history of angina, kidney disease, CAD/MI, CVA, heart failure, left ventricular hypertrophy, PVD or retinopathy. There is no history of chronic renal disease, a hypertension causing med or renovascular disease.  Hyperlipidemia This is a chronic problem. The current episode started more than 1 year ago. The problem is controlled. Recent lipid tests were reviewed and are normal. He has no history of chronic renal disease, diabetes, hypothyroidism, liver disease or nephrotic syndrome. There are no known factors aggravating his hyperlipidemia. Pertinent negatives include no chest pain, focal sensory loss, focal weakness, leg pain, myalgias or shortness of breath. Treatments tried: patient stopped simvastatin for bees buzzing in head. The current treatment provides moderate improvement of lipids. There are no compliance problems.   Diabetes He presents for his follow-up diabetic visit. He has type 2 diabetes mellitus. His disease course has been stable. There are no hypoglycemic associated symptoms. Pertinent negatives for hypoglycemia include no dizziness, headaches, nervousness/anxiousness or sweats. There are no diabetic associated  symptoms. Pertinent negatives for diabetes include no blurred vision, no chest pain, no fatigue, no foot paresthesias, no foot ulcerations, no polydipsia, no polyphagia, no polyuria, no visual change, no weakness and no weight loss. There are no hypoglycemic complications. Symptoms are stable. There are no diabetic complications. Pertinent negatives for diabetic complications include no CVA, PVD or retinopathy. Current diabetic treatment includes diet. He is following a generally healthy diet. Meal planning includes avoidance of concentrated sweets and carbohydrate counting. His breakfast blood glucose is taken between 8-9 am. His breakfast blood glucose range is generally 110-130 mg/dl. An ACE inhibitor/angiotensin II receptor blocker is not being taken.    Review of Systems  Constitutional: Negative for chills, fatigue, fever, malaise/fatigue and weight loss.  HENT: Negative for drooling, ear discharge, ear pain and sore throat.   Eyes: Negative for blurred vision.  Respiratory: Negative for cough, shortness of breath and wheezing.   Cardiovascular: Negative for chest pain, palpitations, orthopnea, leg swelling and PND.  Gastrointestinal: Negative for abdominal pain, blood in stool, constipation, diarrhea and nausea.  Endocrine: Negative for polydipsia, polyphagia and polyuria.  Genitourinary: Negative for dysuria, frequency, hematuria and urgency.  Musculoskeletal: Negative for back pain, myalgias and neck pain.  Skin: Negative for rash.  Allergic/Immunologic: Negative for environmental allergies.  Neurological: Negative for dizziness, focal weakness, weakness and headaches.  Hematological: Does not bruise/bleed easily.  Psychiatric/Behavioral: Negative for suicidal ideas. The patient is not nervous/anxious.     Patient Active Problem List   Diagnosis Date Noted  . Leg pain 08/16/2018  . PAD (peripheral artery disease) ([place]) 08/16/2018  . DJD (degenerative joint disease) 08/16/2018  .  Essential hypertension 05/11/2015  . Type 2 diabetes mellitus without complication, without long-term current use of insulin ([place]) 05/11/2015  . Primary prostate adenocarcinoma ([place]) 05/11/2015  .  Hyperlipidemia 05/11/2015  . Prostate hypertrophy 05/11/2015  . Taking multiple medications for chronic disease 05/11/2015  . Benign prostatic hyperplasia with urinary obstruction 12/07/2014  . Elevated prostate specific antigen (PSA) 12/04/2014    Allergies  Allergen Reactions  . Lisinopril     coughing coughing    Past Surgical History:  Procedure Laterality Date  . CATARACT EXTRACTION W/PHACO Right 09/27/2017   Procedure: CATARACT EXTRACTION PHACO AND INTRAOCULAR LENS PLACEMENT ([place]) RIGHT;  Surgeon: Leandrew Koyanagi, MD;  Location: [facility];  Service: Ophthalmology;  Laterality: Right;  IVA TOPICAL DIABETES  . CATARACT EXTRACTION W/PHACO Left 10/18/2017   Procedure: CATARACT EXTRACTION PHACO AND INTRAOCULAR LENS PLACEMENT ([place])  LEFT DIABETIC;  Surgeon: Leandrew Koyanagi, MD;  Location: [facility];  Service: Ophthalmology;  Laterality: Left;  DIABETIC-ORAL MED  . COLONOSCOPY  2011   normal- Dr?    Social History   Tobacco Use  . Smoking status: Never Smoker  . Smokeless tobacco: Never Used  Substance Use Topics  . Alcohol use: No    Alcohol/week: 0.0 standard drinks  . Drug use: No     Medication list has been reviewed and updated.  Current Meds  Medication Sig  . aspirin 81 MG tablet Take 81 mg by mouth daily.  Marland Kitchen gabapentin (NEURONTIN) 100 MG capsule Take 1 capsule (100 mg total) by mouth 2 (two) times daily.  Marland Kitchen glucose blood (GE100 BLOOD GLUCOSE TEST) test strip 1 each by Other route daily. Use as instructed- Dx E11.9  . hydrochlorothiazide (HYDRODIURIL) 25 MG tablet TAKE 1 TABLET EVERY DAY  . Lancets (FREESTYLE) lancets AS DIRECTED  . megestrol (MEGACE) 20 MG tablet Take 1 tablet (20 mg total) by mouth 2 (two) times daily as needed (Hot  flashes). (Patient taking differently: Take 20 mg by mouth 2 (two) times daily as needed (Hot flashes). Dr Bernardo Heater)    [facility] 2/9 Scores 10/17/2018 08/06/2018 04/04/2018 11/02/2017  PHQ - 2 Score 0 0 0 0  PHQ- 9 Score 0 0 0 -    BP Readings from Last 3 Encounters:  09/14/18 120/60  08/16/18 137/66  08/14/18 134/74    Physical Exam Vitals signs and nursing note reviewed.  HENT:     Head: Normocephalic.     Right Ear: Tympanic membrane, ear canal and external ear normal.     Left Ear: Tympanic membrane, ear canal and external ear normal.     Nose: Nose normal.  Eyes:     General: No scleral icterus.       Right eye: No discharge.        Left eye: No discharge.     Conjunctiva/sclera: Conjunctivae normal.     Pupils: Pupils are equal, round, and reactive to light.  Neck:     Musculoskeletal: Normal range of motion and neck supple.     Thyroid: No thyromegaly.     Vascular: No JVD.     Trachea: No tracheal deviation.  Cardiovascular:     Rate and Rhythm: Normal rate and regular rhythm.     Heart sounds: Normal heart sounds. No murmur. No friction rub. No gallop.   Pulmonary:     Effort: No respiratory distress.     Breath sounds: Normal breath sounds. No wheezing or rales.  Abdominal:     General: Bowel sounds are normal.     Palpations: Abdomen is soft. There is no mass.     Tenderness: There is no abdominal tenderness. There is no guarding or rebound.  Musculoskeletal:  Normal range of motion.        General: No tenderness.  Lymphadenopathy:     Cervical: No cervical adenopathy.  Skin:    General: Skin is warm.     Findings: No rash.  Neurological:     Mental Status: He is alert and oriented to person, place, and time.     Cranial Nerves: No cranial nerve deficit.     Deep Tendon Reflexes: Reflexes are normal and symmetric.     Wt Readings from Last 3 Encounters:  10/17/18 228 lb (103.4 kg)  09/14/18 222 lb (100.7 kg)  08/16/18 222 lb (100.7 kg)    Pulse 80   Ht  6\' 2"  (1.88 m)   Wt 228 lb (103.4 kg)   BMI 29.27 kg/m   Assessment and Plan:  1. Elevated LDH Patient has had a mild elevation of LDH in the past and we will check his LDH to confirm that it is within normal limits at this time. - Lactate Dehydrogenase (LDH)  2. Mixed hyperlipidemia Meantime we will also check patient's cholesterol because he is recently stopped his simvastatin due to some tinnitus-like symptoms.  That is cleared since he is stopped the simvastatin and we will check his cholesterol and likely continue to control with diet alone. - Lipid Panel With LDL/HDL Ratio

## 2018-10-17 NOTE — Patient Instructions (Signed)
GUIDELINES FOR  LOW-CHOLESTEROL, LOW-TRIGLYCERIDE DIETS    FOODS TO USE   MEATS, FISH Choose lean meats (chicken, turkey, veal, and non-fatty cuts of beef with excess fat trimmed; one serving = 3 oz of cooked meat). Also, fresh or frozen fish, canned fish packed in water, and shellfish (lobster, crabs, shrimp, and oysters). Limit use to no more than one serving of one of these per week. Shellfish are high in cholesterol but low in saturated fat and should be used sparingly. Meats and fish should be broiled (pan or oven) or baked on a rack.  EGGS Egg substitutes and egg whites (use freely). Egg yolks (limit two per week).  FRUITS Eat three servings of fresh fruit per day (1 serving =  cup). Be sure to have at least one citrus fruit daily. Frozen and canned fruit with no sugar or syrup added may be used.  VEGETABLES Most vegetables are not limited (see next page). One dark-green (string beans, escarole) or one deep yellow (squash) vegetable is recommended daily. Cauliflower, broccoli, and celery, as well as potato skins, are recommended for their fiber content. (Fiber is associated with cholesterol reduction) It is preferable to steam vegetables, but they may be boiled, strained, or braised with polyunsaturated vegetable oil (see below).  BEANS Dried peas or beans (1 serving =  cup) may be used as a bread substitute.  NUTS Almonds, walnuts, and peanuts may be used sparingly  (1 serving = 1 Tablespoonful). Use pumpkin, sesame, or sunflower seeds.  BREADS, GRAINS One roll or one slice of whole grain or enriched bread may be used, or three soda crackers or four pieces of melba toast as a substitute. Spaghetti, rice or noodles ( cup) or  large ear of corn may be used as a bread substitute. In preparing these foods do not use butter or shortening, use soft margarine. Also use egg and sugar substitutes.  Choose high fiber grains, such as oats and whole wheat.  CEREALS Use  cup of hot cereal or  cup of  cold cereal per day. Add a sugar substitute if desired, with 99% fat free or skim milk.  MILK PRODUCTS Always use 99% fat free or skim milk, dairy products such as low fat cheeses (farmer's uncreamed diet cottage), low-fat yogurt, and powdered skim milk.  FATS, OILS Use soft (not stick) margarine; vegetable oils that are high in polyunsaturated fats (such as safflower, sunflower, soybean, corn, and cottonseed). Always refrigerate meat drippings to harden the fat and remove it before preparing gravies  DESSERTS, SNACKS Limit to two servings per day; substitute each serving for a bread/cereal serving: ice milk, water sherbet (1/4 cup); unflavored gelatin or gelatin flavored with sugar substitute (1/3 cup); pudding prepared with skim milk (1/2 cup); egg white souffls; unbuttered popcorn (1  cups). Substitute carob for chocolate.  BEVERAGES Fresh fruit juices (limit 4 oz per day); black coffee, plain or herbal teas; soft drinks with sugar substitutes; club soda, preferably salt-free; cocoa made with skim milk or nonfat dried milk and water (sugar substitute added if desired); clear broth. Alcohol: limit two servings per day (see second page).  MISCELLANEOUS  You may use the following freely: vinegar, spices, herbs, nonfat bouillon, mustard, Worcestershire sauce, soy sauce, flavoring essence.                  GUIDELINES FOR  LOW-CHOLESTEROL, LOW TRIGLYCERIDE DIETS    FOODS TO AVOID   MEATS, FISH Marbled beef, pork, bacon, sausage, and other pork products; fatty   fowl (duck, goose); skin and fat of turkey and chicken; processed meats; luncheon meats (salami, bologna); frankfurters and fast-food hamburgers (theyre loaded with fat); organ meats (kidneys, liver); canned fish packed in oil.  EGGS Limit egg yolks to two per week.   FRUITS Coconuts (rich in saturated fats).  VEGETABLES Avoid avocados. Starchy vegetables (potatoes, corn, lima beans, dried peas, beans) may be used only if  substitutes for a serving of bread or cereal. (Baked potato skin, however, is desirable for its fiber content.  BEANS Commercial baked beans with sugar and/or pork added.  NUTS Avoid nuts.  Limit peanuts and walnuts to one tablespoonful per day.  BREADS, GRAINS Any baked goods with shortening and/or sugar. Commercial mixes with dried eggs and whole milk. Avoid sweet rolls, doughnuts, breakfast pastries (Danish), and sweetened packaged cereals (the added sugar converts readily to triglycerides).  MILK PRODUCTS Whole milk and whole-milk packaged goods; cream; ice cream; whole-milk puddings, yogurt, or cheeses; nondairy cream substitutes.  FATS, OILS Butter, lard, animal fats, bacon drippings, gravies, cream sauces as well as palm and coconut oils. All these are high in saturated fats. Examine labels on cholesterol free products for hydrogenated fats. (These are oils that have been hardened into solids and in the process have become saturated.)  DESSERTS, SNACKS Fried snack foods like potato chips; chocolate; candies in general; jams, jellies, syrups; whole- milk puddings; ice cream and milk sherbets; hydrogenated peanut butter.  BEVERAGES Sugared fruit juices and soft drinks; cocoa made with whole milk and/or sugar. When using alcohol (1 oz liquor, 5 oz beer, or 2  oz dry table wine per serving), one serving must be substituted for one bread or cereal serving (limit, two servings of alcohol per day).   SPECIAL NOTES    1. Remember that even non-limited foods should be used in moderation. 2. While on a cholesterol-lowering diet, be sure to avoid animal fats and marbled meats. 3. 3. While on a triglyceride-lowering diet, be sure to avoid sweets and to control the amount of carbohydrates you eat (starchy foods such as flour, bread, potatoes).While on a tri-glyceride-lowering diet, be sure to avoid sweets 4. Buy a good low-fat cookbook, such as the one published by the American Heart Association. 5. Consult  your physician if you have any questions.               [facility] Low Glycemic Diet Plan   Low Glycemic Foods (20-49) Moderate Glycemic Foods (50-69) High Glycemic Foods (70-100)      Breakfast Creals Breakfast Cereals Breakfast Cereals  All Bran All-Bran Fruit'n Oats   Bran Buds Bran Chex   Cheerios Corn chex    Fiber One Oatmeal (not instant)   Just Right Mini-Wheats   Corn Flakes Cream of Wheat    Oat Bran Special K Swiss Muesli   Grape Nuts Grape Nut Flakes      Grits Nutri-Grain    Fruits and fruit juice: Fruits Puffed Rice Puffed Wheat    (Limit to 1-2 Servings per day) Banana (under-ride) Dates   Rice Chex Rice Krispies    Apples Apricots (fresh/dried)   Figs Grapes   Shredded Wheat Team    Blackberries Blueberries   Kiwi Mango   Total     Cherries Cranberries   Oranges Raisins     Peaches Pears    Fruits  Plums Prunes   Fruit Juices Pineapple Watermelon    Grapefruit Raspberries   Cranberry Juice Orange Juice   Banana (over-ripe)       Strawberries Tangerines      Apple Juice Grapefruit Juice   Beans and Legumes Beverages  Tomato Juice    Boston-type baked beans Sodas, sweet tea, pineapple juice   Canned pinto, kidney, or navy beans   Beans and Legumes (fresh-cooked) Green peas Vegetables  Black-eyed peas Butter Beans    Potato, baked, boiled, fried, mashed  Chick peas Lentils   Vegetables French fries  Green beans Lima beans   Beets Carrots   Canned or frozen corn  Kidney beans Navy beans   Sweet potato Yam   Parsnips  Pinto beans Snow peas   Corn on the cob Winter squash      Non-starchy vegetables Grains Breads  Asparagus, avocado, broccoli, cabbage Cornmeal Rice, Shigeo   Most breads (white and whole grain)  cauliflower, celery, cucumber, greens Rice, white Couscous   Bagels Bread sticks    lettuce, mushrooms, peppers, tomatoes  Bread stuffing Kaiser roll    okra, onions, spinach, summer squash Pasta Dinner rolls    Macaroni Pizza, cheese     Grains Ravioli, meat filled Spaghetti, white   Grains  Barley Bulgur    Rice, instant Tapioca, with milk    Rye Wild rice   Nuts    Cashews Macadamia   Candy and most cookies  Nuts and oils    Almonds, peanuts, sunflower seeds Snacks Snacks  hazelnuts, pecans, walnuts Chocolate Ice cream, lowfat   Donuts Corn chips    Oils that are liquid at room temperature Muffin Popcorn   Jelly beans Pretzels      Pastries  Dairy, fish, meat, soy, and eggs    Milk, skim Lowfat cheese    Restaurant and ethnic foods  Yogurt, lowfat, fruit sugar sweetened  Most Chinese food (sugar in stir fry    or wok sauce)  Lean red meat Fish    Teriyaki-style meats and vegetables  Skinless chicken and turkey, shellfish        Egg whites (up to 3 daily), Soy Products    Egg yolks (up to 7 or _____ per week)      

## 2018-10-18 LAB — LIPID PANEL WITH LDL/HDL RATIO
Cholesterol, Total: 231 mg/dL — ABNORMAL HIGH (ref 100–199)
HDL: 49 mg/dL (ref >39)
LDL Calculated: 157 mg/dL — ABNORMAL HIGH (ref 0–99)
LDl/HDL Ratio: 3.2 ratio (ref 0.0–3.6)
Triglycerides: 123 mg/dL (ref 0–149)
VLDL Cholesterol Cal: 25 mg/dL (ref 5–40)

## 2018-10-18 LAB — LACTATE DEHYDROGENASE: LDH: 162 IU/L (ref 121–224)

## 2018-11-01 ENCOUNTER — Other Ambulatory Visit: Payer: Self-pay | Admitting: Family Medicine

## 2018-11-01 MED ORDER — MEGESTROL ACETATE 20 MG PO TABS: 20.0000 mg | ORAL_TABLET | Freq: Two times a day (BID) | ORAL | 1 refills | Status: DC | PRN

## 2018-11-21 ENCOUNTER — Other Ambulatory Visit: Payer: Self-pay

## 2018-11-21 NOTE — Patient Outreach (Signed)
[facility]) Care Management  11/21/2018  [place]. 1928/03/06 [id number]   Medication Adherence call to [name] Hippa Identifiers Verify spoke with patient he is past due on Metformin 500 mg patient explain he is no longer taking this medication doctor took him off patient is now taking something else.Brandon Dillon is showing past due under [place].   [place] Management Direct Dial [phone]  Fax [phone] [email or web address]

## 2018-12-07 ENCOUNTER — Other Ambulatory Visit: Payer: Self-pay

## 2018-12-07 ENCOUNTER — Encounter: Payer: Self-pay | Admitting: Family Medicine

## 2018-12-07 ENCOUNTER — Ambulatory Visit (INDEPENDENT_AMBULATORY_CARE_PROVIDER_SITE_OTHER): Payer: Medicare Other | Admitting: Family Medicine

## 2018-12-07 VITALS — BP 130/60 | HR 72 | Ht 74.0 in | Wt 227.0 lb

## 2018-12-07 DIAGNOSIS — R7303 Prediabetes: Secondary | ICD-10-CM

## 2018-12-07 DIAGNOSIS — G609 Hereditary and idiopathic neuropathy, unspecified: Secondary | ICD-10-CM | POA: Diagnosis not present

## 2018-12-07 MED ORDER — GABAPENTIN 100 MG PO CAPS: 100.0000 mg | ORAL_CAPSULE | Freq: Two times a day (BID) | ORAL | 1 refills | Status: DC

## 2018-12-07 NOTE — Patient Instructions (Signed)

## 2018-12-07 NOTE — Progress Notes (Signed)
Date:  12/07/2018   Name:  Brandon Dillon.   DOB:  1927-06-08   MRN:  [id number]   Chief Complaint: Diabetes (follow up after taking off metformin and starting gabapentin- legs are better and BS 122)  Diabetes He presents for his follow-up diabetic visit. He has type 2 diabetes mellitus. His disease course has been stable. There are no hypoglycemic associated symptoms. Pertinent negatives for hypoglycemia include no dizziness, headaches or nervousness/anxiousness. Associated symptoms include foot paresthesias. Pertinent negatives for diabetes include no blurred vision, no chest pain, no fatigue, no foot ulcerations, no polydipsia, no polyphagia, no polyuria, no visual change, no weakness and no weight loss. There are no hypoglycemic complications. Symptoms are stable. Diabetic complications include nephropathy and peripheral neuropathy. Pertinent negatives for diabetic complications include no autonomic neuropathy, CVA, heart disease or impotence. Current diabetic treatment includes diet. He is compliant with treatment some of the time. He is following a generally healthy diet. Meal planning includes avoidance of concentrated sweets and carbohydrate counting. His home blood glucose trend is fluctuating minimally. His breakfast blood glucose range is generally 110-130 mg/dl.  Neurologic Problem The patient's primary symptoms include focal sensory loss. The patient's pertinent negatives include no altered mental status, clumsiness, focal weakness, loss of balance, memory loss, near-syncope, slurred speech, syncope, visual change or weakness. Primary symptoms comment: neuropathy. This is a new problem. The problem has been gradually improving (on gabapentin) since onset. Pertinent negatives include no abdominal pain, back pain, chest pain, dizziness, fatigue, fever, headaches, nausea, neck pain, palpitations or shortness of breath.    Review of Systems  Constitutional: Negative for chills, fatigue,  fever and weight loss.  HENT: Negative for drooling, ear discharge, ear pain and sore throat.   Eyes: Negative for blurred vision.  Respiratory: Negative for cough, shortness of breath and wheezing.   Cardiovascular: Negative for chest pain, palpitations, leg swelling and near-syncope.  Gastrointestinal: Negative for abdominal pain, blood in stool, constipation, diarrhea and nausea.  Endocrine: Negative for polydipsia, polyphagia and polyuria.  Genitourinary: Negative for dysuria, frequency, hematuria, impotence and urgency.  Musculoskeletal: Negative for back pain, myalgias and neck pain.  Skin: Negative for rash.  Allergic/Immunologic: Negative for environmental allergies.  Neurological: Negative for dizziness, focal weakness, syncope, weakness, headaches and loss of balance.  Hematological: Does not bruise/bleed easily.  Psychiatric/Behavioral: Negative for memory loss and suicidal ideas. The patient is not nervous/anxious.     Patient Active Problem List   Diagnosis Date Noted  . Leg pain 08/16/2018  . PAD (peripheral artery disease) ([place]) 08/16/2018  . DJD (degenerative joint disease) 08/16/2018  . Essential hypertension 05/11/2015  . Type 2 diabetes mellitus without complication, without long-term current use of insulin ([place]) 05/11/2015  . Primary prostate adenocarcinoma ([place]) 05/11/2015  . Hyperlipidemia 05/11/2015  . Prostate hypertrophy 05/11/2015  . Taking multiple medications for chronic disease 05/11/2015  . Benign prostatic hyperplasia with urinary obstruction 12/07/2014  . Elevated prostate specific antigen (PSA) 12/04/2014    Allergies  Allergen Reactions  . Lisinopril     coughing coughing    Past Surgical History:  Procedure Laterality Date  . CATARACT EXTRACTION W/PHACO Right 09/27/2017   Procedure: CATARACT EXTRACTION PHACO AND INTRAOCULAR LENS PLACEMENT ([place]) RIGHT;  Surgeon: Leandrew Koyanagi, MD;  Location: [place];  Service: Ophthalmology;   Laterality: Right;  IVA TOPICAL DIABETES  . CATARACT EXTRACTION W/PHACO Left 10/18/2017   Procedure: CATARACT EXTRACTION PHACO AND INTRAOCULAR LENS PLACEMENT ([place])  LEFT DIABETIC;  Surgeon: Leandrew Koyanagi,  MD;  Location: [place];  Service: Ophthalmology;  Laterality: Left;  DIABETIC-ORAL MED  . COLONOSCOPY  2011   normal- Dr?    Social History   Tobacco Use  . Smoking status: Never Smoker  . Smokeless tobacco: Never Used  Substance Use Topics  . Alcohol use: No    Alcohol/week: 0.0 standard drinks  . Drug use: No     Medication list has been reviewed and updated.  Current Meds  Medication Sig  . aspirin 81 MG tablet Take 81 mg by mouth daily.  Marland Kitchen gabapentin (NEURONTIN) 100 MG capsule Take 1 capsule (100 mg total) by mouth 2 (two) times daily.  Marland Kitchen glucose blood (GE100 BLOOD GLUCOSE TEST) test strip 1 each by Other route daily. Use as instructed- Dx E11.9  . hydrochlorothiazide (HYDRODIURIL) 25 MG tablet TAKE 1 TABLET EVERY DAY  . Lancets (FREESTYLE) lancets AS DIRECTED  . megestrol (MEGACE) 20 MG tablet Take 1 tablet (20 mg total) by mouth 2 (two) times daily as needed (Hot flashes).    PHQ 2/9 Scores 12/07/2018 10/17/2018 08/06/2018 04/04/2018  PHQ - 2 Score 0 0 0 0  PHQ- 9 Score 0 0 0 0    BP Readings from Last 3 Encounters:  12/07/18 130/60  10/17/18 120/60  09/14/18 120/60    Physical Exam Vitals signs and nursing note reviewed.  HENT:     Head: Normocephalic.     Right Ear: Tympanic membrane, ear canal and external ear normal.     Left Ear: Tympanic membrane, ear canal and external ear normal.     Nose: Nose normal.  Eyes:     General: No scleral icterus.       Right eye: No discharge.        Left eye: No discharge.     Conjunctiva/sclera: Conjunctivae normal.     Pupils: Pupils are equal, round, and reactive to light.  Neck:     Musculoskeletal: Normal range of motion and neck supple.     Thyroid: No thyromegaly.     Vascular: No JVD.      Trachea: No tracheal deviation.  Cardiovascular:     Rate and Rhythm: Normal rate and regular rhythm.     Heart sounds: Normal heart sounds. No murmur. No friction rub. No gallop.   Pulmonary:     Effort: No respiratory distress.     Breath sounds: Normal breath sounds. No wheezing, rhonchi or rales.  Abdominal:     General: Bowel sounds are normal.     Palpations: Abdomen is soft. There is no mass.     Tenderness: There is no abdominal tenderness. There is no guarding or rebound.  Musculoskeletal: Normal range of motion.        General: No tenderness.  Lymphadenopathy:     Cervical: No cervical adenopathy.  Skin:    General: Skin is warm.     Findings: No rash.  Neurological:     Mental Status: He is alert and oriented to person, place, and time.     Cranial Nerves: No cranial nerve deficit.     Sensory: Sensation is intact.     Motor: Motor function is intact.     Deep Tendon Reflexes: Reflexes are normal and symmetric.     Wt Readings from Last 3 Encounters:  12/07/18 227 lb (103 kg)  10/17/18 228 lb (103.4 kg)  09/14/18 222 lb (100.7 kg)    BP 130/60   Pulse 72   Ht 6\' 2"  (1.88  m)   Wt 227 lb (103 kg)   BMI 29.15 kg/m   Assessment and Plan:  1. Idiopathic peripheral neuropathy Chronic.  Patient with no new change in medication.  Patient was initiated on gabapentin 100 mg twice a day to take for diabetic neuropathy.  Patient has noticed significant improvement of the discomfort and will continue at 100 mg twice a day. - gabapentin (NEURONTIN) 100 MG capsule; Take 1 capsule (100 mg total) by mouth 2 (two) times daily.  Dispense: 180 capsule; Refill: 1  2. Prediabetes Patient with history of prediabetes last A1c was 5.1 and will recheck today. - Hemoglobin A1c

## 2018-12-08 LAB — HEMOGLOBIN A1C
Est. average glucose Bld gHb Est-mCnc: 114 mg/dL
Hgb A1c MFr Bld: 5.6 % (ref 4.8–5.6)

## 2018-12-25 ENCOUNTER — Other Ambulatory Visit: Payer: Self-pay | Admitting: Family Medicine

## 2018-12-25 MED ORDER — MEGESTROL ACETATE 20 MG PO TABS: 20.0000 mg | ORAL_TABLET | Freq: Two times a day (BID) | ORAL | 1 refills | Status: DC | PRN

## 2018-12-27 ENCOUNTER — Other Ambulatory Visit: Payer: Self-pay

## 2018-12-27 NOTE — Patient Outreach (Signed)
[facility]) Care Management  12/27/2018  [place] Merc[place]. 1927/05/11 [id number]   Medication Adherence call to Mr. Andry Barfuss [facility] Ident[facility] Verify spoke with patients wife she explain patient is no longer taking Metformin 500 mg doctor took him off and is taking a different medication for diabetes. Mr. Stedman os showing past due under [place].   [place] Management Direct Dial [phone]  Fax [phone] [email or web address]

## 2019-01-12 ENCOUNTER — Emergency Department
Admission: EM | Admit: 2019-01-12 | Discharge: 2019-01-12 | Disposition: A | Discharge status: Home / Self Care | Payer: Medicare Other | Attending: Emergency Medicine | Admitting: Emergency Medicine

## 2019-01-12 ENCOUNTER — Emergency Department: Payer: Medicare Other

## 2019-01-12 ENCOUNTER — Ambulatory Visit (INDEPENDENT_AMBULATORY_CARE_PROVIDER_SITE_OTHER)
Admission: EM | Admit: 2019-01-12 | Discharge: 2019-01-12 | Disposition: A | Discharge status: Home / Self Care | Payer: Medicare Other | Source: Home / Self Care

## 2019-01-12 ENCOUNTER — Other Ambulatory Visit: Payer: Self-pay

## 2019-01-12 ENCOUNTER — Encounter: Payer: Self-pay | Admitting: Emergency Medicine

## 2019-01-12 DIAGNOSIS — Z79899 Other long term (current) drug therapy: Secondary | ICD-10-CM | POA: Insufficient documentation

## 2019-01-12 DIAGNOSIS — R079 Chest pain, unspecified: Secondary | ICD-10-CM | POA: Diagnosis not present

## 2019-01-12 DIAGNOSIS — I10 Essential (primary) hypertension: Secondary | ICD-10-CM | POA: Diagnosis not present

## 2019-01-12 DIAGNOSIS — E119 Type 2 diabetes mellitus without complications: Secondary | ICD-10-CM | POA: Diagnosis not present

## 2019-01-12 DIAGNOSIS — R202 Paresthesia of skin: Secondary | ICD-10-CM | POA: Diagnosis not present

## 2019-01-12 DIAGNOSIS — H9313 Tinnitus, bilateral: Secondary | ICD-10-CM | POA: Diagnosis not present

## 2019-01-12 DIAGNOSIS — Z7984 Long term (current) use of oral hypoglycemic drugs: Secondary | ICD-10-CM | POA: Insufficient documentation

## 2019-01-12 DIAGNOSIS — R29898 Other symptoms and signs involving the musculoskeletal system: Secondary | ICD-10-CM

## 2019-01-12 DIAGNOSIS — R200 Anesthesia of skin: Secondary | ICD-10-CM | POA: Diagnosis not present

## 2019-01-12 DIAGNOSIS — Z7982 Long term (current) use of aspirin: Secondary | ICD-10-CM | POA: Diagnosis not present

## 2019-01-12 DIAGNOSIS — R42 Dizziness and giddiness: Secondary | ICD-10-CM | POA: Insufficient documentation

## 2019-01-12 LAB — URINALYSIS, COMPLETE (UACMP) WITH MICROSCOPIC
Bacteria, UA: NONE SEEN
Bilirubin Urine: NEGATIVE
Glucose, UA: NEGATIVE mg/dL
Ketones, ur: NEGATIVE mg/dL
Leukocytes,Ua: NEGATIVE
Nitrite: NEGATIVE
Protein, ur: NEGATIVE mg/dL
Specific Gravity, Urine: 1.009 (ref 1.005–1.030)
pH: 5 (ref 5.0–8.0)

## 2019-01-12 LAB — COMPREHENSIVE METABOLIC PANEL
ALT: 11 U/L (ref 0–44)
AST: 13 U/L — ABNORMAL LOW (ref 15–41)
Albumin: 4.5 g/dL (ref 3.5–5.0)
Alkaline Phosphatase: 107 U/L (ref 38–126)
Anion gap: 10 (ref 5–15)
BUN: 18 mg/dL (ref 8–23)
CO2: 26 mmol/L (ref 22–32)
Calcium: 9.3 mg/dL (ref 8.9–10.3)
Chloride: 102 mmol/L (ref 98–111)
Creatinine, Ser: 1.26 mg/dL — ABNORMAL HIGH (ref 0.61–1.24)
GFR calc Af Amer: 59 mL/min — ABNORMAL LOW (ref >60)
GFR calc non Af Amer: 51 mL/min — ABNORMAL LOW (ref >60)
Glucose, Bld: 108 mg/dL — ABNORMAL HIGH (ref 70–99)
Potassium: 3.7 mmol/L (ref 3.5–5.1)
Sodium: 138 mmol/L (ref 135–145)
Total Bilirubin: 0.9 mg/dL (ref 0.3–1.2)
Total Protein: 7.8 g/dL (ref 6.5–8.1)

## 2019-01-12 LAB — CBC
HCT: 38.5 % — ABNORMAL LOW (ref 39.0–52.0)
Hemoglobin: 12.5 g/dL — ABNORMAL LOW (ref 13.0–17.0)
MCH: 30 pg (ref 26.0–34.0)
MCHC: 32.5 g/dL (ref 30.0–36.0)
MCV: 92.3 fL (ref 80.0–100.0)
Platelets: 353 10*3/uL (ref 150–400)
RBC: 4.17 MIL/uL — ABNORMAL LOW (ref 4.22–5.81)
RDW: 12.5 % (ref 11.5–15.5)
WBC: 5.9 10*3/uL (ref 4.0–10.5)
nRBC: 0 % (ref 0.0–0.2)

## 2019-01-12 MED ORDER — SODIUM CHLORIDE 0.9% FLUSH: 3.0000 mL | Freq: Once | INTRAVENOUS | Status: DC

## 2019-01-12 NOTE — ED Triage Notes (Signed)
States dizziness and "tingling" in head for past 2 to 3 days. Denies headache at present. Speech clear. Moves all extremities.

## 2019-01-12 NOTE — ED Provider Notes (Signed)
Patient denies complaints at this time, no clear etiology for dizziness.  He is cleared for outpatient follow-up with his doctor.   Earleen Newport, MD 01/12/19 [phone]

## 2019-01-12 NOTE — ED Notes (Signed)
Pt and family updated. Pt denies dizziness. Family explains that pt typically gets dizzy when going from sitting to standing positions.

## 2019-01-12 NOTE — ED Triage Notes (Signed)
As per patient gets dizzy and has headache onset 4 days

## 2019-01-12 NOTE — ED Provider Notes (Signed)
[place], [place]   Name: Brandon Dillon. DOB: [birth year] MRN: [id number] CSN: [id number] PCP: Juline Patch, MD  Arrival date and time:  01/12/19 1042  Chief Complaint:  Dizziness   NOTE: Prior to seeing the patient today, I have reviewed the triage nursing documentation and vital signs. Clinical staff has updated patient's PMH/PSHx, current medication list, and drug allergies/intolerances to ensure comprehensive history available to assist in medical decision making.   History:   HPI: Jiair Joanis. is a 85 y.o. male who presents today with complaints of dizziness and any intermittent headache for the last 3-4 days. Patient describes numbness from his LEFT scalp extending from the forehead back to the middle of his head. He states, "it feels like bees or someone frying bacon". He reports an intermittent burning sensation to the left side of his head. He is not experiencing a headache at the time of his visit today, however notes that the "buzzing" is there and worse that it has been. Patient advises that when he has the "dizzy spells" he experiences concurrent weakness in his lower extremities to the point where he feels like they are going to "go out on him". Patient has had similar episodes in the past. He reports that he underwent a full workup at [facility] in April of this year. He adds, "all that testing and they didn't find anything. They just told me to see my doctor". When patient followed up with PCP Ronnald Ramp, MD) he was taken off of his Metformin and started on daily gabapentin, which resolved the aforementioned symptoms. Patient presents today reporting that he feels like the gabapentin is not working anymore and he is dizzy, hearing the "buzzing" sound, and having intermittent pains in his head again. Patient denies any chest pain, shortness of breath, or recent changes to his speech or mentation.   Past Medical History:  Diagnosis Date  . Diabetes mellitus without complication ([place])    . Hearing loss   . Hx of fracture of wrist 07/20/2017   >30 years ago. <1989  . Hyperlipidemia   . Hypertension   . Prostate cancer [facility])     Past Surgical History:  Procedure Laterality Date  . CATARACT EXTRACTION W/PHACO Right 09/27/2017   Procedure: CATARACT EXTRACTION PHACO AND INTRAOCULAR LENS PLACEMENT ([place]) RIGHT;  Surgeon: Leandrew Koyanagi, MD;  Location: [place];  Service: Ophthalmology;  Laterality: Right;  IVA TOPICAL DIABETES  . CATARACT EXTRACTION W/PHACO Left 10/18/2017   Procedure: CATARACT EXTRACTION PHACO AND INTRAOCULAR LENS PLACEMENT ([place])  LEFT DIABETIC;  Surgeon: Leandrew Koyanagi, MD;  Location: [place];  Service: Ophthalmology;  Laterality: Left;  DIABETIC-ORAL MED  . COLONOSCOPY  2011   normal- Dr?    Family History  Family history unknown: Yes    Social History   Tobacco Use  . Smoking status: Never Smoker  . Smokeless tobacco: Never Used  Substance Use Topics  . Alcohol use: No    Alcohol/week: 0.0 standard drinks  . Drug use: No    Patient Active Problem List   Diagnosis Date Noted  . Leg pain 08/16/2018  . PAD (peripheral artery disease) ([place]) 08/16/2018  . DJD (degenerative joint disease) 08/16/2018  . Essential hypertension 05/11/2015  . Type 2 diabetes mellitus without complication, without long-term current use of insulin ([place]) 05/11/2015  . Primary prostate adenocarcinoma ([place]) 05/11/2015  . Hyperlipidemia 05/11/2015  . Prostate hypertrophy 05/11/2015  . Taking multiple medications for chronic disease 05/11/2015  . Benign prostatic hyperplasia with  urinary obstruction 12/07/2014  . Elevated prostate specific antigen (PSA) 12/04/2014    Home Medications:    No outpatient medications have been marked as taking for the 01/12/19 encounter [facility] Encounter).    Allergies:   Lisinopril  Review of Systems (ROS): Review of Systems  Constitutional: Negative for chills and fever.  HENT: Positive for  tinnitus.   Respiratory: Negative for cough and shortness of breath.   Cardiovascular: Negative for chest pain and palpitations.  Gastrointestinal: Negative for abdominal pain, diarrhea, nausea and vomiting.  Musculoskeletal: Negative for back pain, neck pain and neck stiffness.  Neurological: Positive for dizziness (intermittent episodes), weakness (intermittent BLE associated with "dizzy spells"), numbness (LEFT side of head; forehead to middle of head) and headaches (intermittent).  All other systems reviewed and are negative.    Vital Signs: Today's Vitals   01/12/19 1105 01/12/19 1110 01/12/19 1112 01/12/19 1214  BP: (!) 159/88 (!) 152/85 (!) 156/84   Pulse: 74 74 79   Resp:  16    Temp:  98.3 F (36.8 C)    TempSrc:  Oral    SpO2:  99%    Weight:      Height:      PainSc:    0-No pain    Physical Exam: Physical Exam  Constitutional: He is oriented to person, place, and time and well-developed, well-nourished, and in no distress.  HENT:  Head: Normocephalic and atraumatic.  Right Ear: Tympanic membrane normal. Decreased hearing (hears buzzing sound) is noted.  Left Ear: Tympanic membrane normal. Decreased hearing (hears buzzing sound) is noted.  Mouth/Throat: Mucous membranes are normal.  Numbness to LEFT scalp "feels likes bees or someone frying bacon".   Eyes: Pupils are equal, round, and reactive to light. EOM are normal.  Neck: Normal range of motion. Neck supple. No tracheal deviation present.  Cardiovascular: Normal rate, regular rhythm, normal heart sounds and intact distal pulses. Exam reveals no gallop and no friction rub.  Pulmonary/Chest: Effort normal and breath sounds normal. No respiratory distress. He has no wheezes. He has no rales.  Abdominal: Soft. Normal appearance and bowel sounds are normal. He exhibits no distension, no abdominal bruit and no pulsatile midline mass. There is no hepatosplenomegaly. There is no abdominal tenderness. There is no CVA  tenderness.  Neurological: He is alert and oriented to person, place, and time. Gait normal.  Skin: Skin is warm and dry. No rash noted.  Psychiatric: Mood, memory, affect and judgment normal.  Nursing note and vitals reviewed.   Urgent Care Treatments / Results:   LABS: PLEASE NOTE: all labs that were ordered this encounter are listed, however only abnormal results are displayed. Labs Reviewed - No data to display  URGENT CARE ECG REPORT Date: 01/12/2019 Time ECG obtained: 1203 PM Rate: 68 bpm Rhythm: normal sinus rhythm Axis (leads I and aVF): normal Intervals: normal ST segment and T wave changes: No evidence of ST segment elevation or depression Comparison: No previous tracings available for review and comparison.   RADIOLOGY: -None  PROCEDURES: Procedures  MEDICATIONS RECEIVED THIS VISIT: Medications - No data to display  PERTINENT CLINICAL COURSE NOTES/UPDATES:   Initial Impression / Assessment and Plan / Urgent Care Course:  Pertinent labs & imaging results that were available during my care of the patient were personally reviewed by me and considered in my medical decision making (see lab/imaging section of note for values and interpretations).  Manmeet Mcquaide. is a 85 y.o. male who presents to [facility]  today with complaints of Dizziness   Patient is well appearing overall in clinic today. He does not appear to be in any acute distress. Presenting symptoms (see HPI) and exam as documented above. EKG normal; NSR without ectopy at rate of 68. Symptoms concerning and felt to warrant a higher level of care than can safely be provided in the urgent care setting. Discussed with patient that he will require further evaluation to full workup his symptoms. Patient will need labs and CT imaging of his head. Recommended for patient to present to the emergency department at either [facility] or [facility] for ongoing care. Patient initially reluctant citing that they  "never find anything". Discussed potential for both neurological and cardiac complications if care is delayed. Patient ultimately agreed to be seen in the ED and indicates that it will be easier for him to go to [facility]. Report called to [facility] staff. They were updated on HPI, assessment, and plans for transfer to their facility. Questions fielded. [facility] staff advised to return call to [facility] staff with any questions or concerns related to the care that [employer]. received at [facility] today.   Final Clinical Impressions / Urgent Care Diagnoses:   Final diagnoses:  Dizziness  Weakness of both lower extremities  Buzzing in ear, bilateral  Numbness and tingling    New Prescriptions:   Controlled Substance Registry consulted? Not Applicable  No orders of the defined types were placed in this encounter.   Recommended Follow up Care:   Follow-up Information    Go to  [facility] ED.         NOTE: This note was prepared using Lobbyist along with smaller Company secretary. Despite my best ability to proofread, there is the potential that transcriptional errors may still occur from this process, and are completely unintentional.    Karen Kitchens, NP 01/12/19 2304

## 2019-01-12 NOTE — ED Provider Notes (Signed)
[facility] Emergency Department Provider Note  Time seen: 2:21 PM  I have reviewed the triage vital signs and the nursing notes.   HISTORY  Chief Complaint Dizziness   HPI Brandon Dillon. is a 85 y.o. male with a past medical history of diabetes, hypertension, hyperlipidemia, prostate cancer, presents to the emergency department for dizziness.   According to the patient over the past several days he has been experiencing intermittent dizziness episodes in which he feels somewhat lightheaded or unstable and hears a "buzzing" sound in his ears for several seconds and then it goes away.  Denies hearing any pulsatile noises.  Denies any head pain or headache.  Patient states between episodes he feels normal including currently.  Denies any weakness or numbness of any arm or leg confused or difficulty speaking at any point.  Denies any recent fever or increased cough over baseline.  Denies shortness of breath chest pain or abdominal pain.    Past Medical History:  Diagnosis Date  . Diabetes mellitus without complication ([place])   . Hearing loss   . Hx of fracture of wrist 07/20/2017   >30 years ago. <1989  . Hyperlipidemia   . Hypertension   . Prostate cancer [facility])     Patient Active Problem List   Diagnosis Date Noted  . Leg pain 08/16/2018  . PAD (peripheral artery disease) ([place]) 08/16/2018  . DJD (degenerative joint disease) 08/16/2018  . Essential hypertension 05/11/2015  . Type 2 diabetes mellitus without complication, without long-term current use of insulin ([place]) 05/11/2015  . Primary prostate adenocarcinoma ([place]) 05/11/2015  . Hyperlipidemia 05/11/2015  . Prostate hypertrophy 05/11/2015  . Taking multiple medications for chronic disease 05/11/2015  . Benign prostatic hyperplasia with urinary obstruction 12/07/2014  . Elevated prostate specific antigen (PSA) 12/04/2014    Past Surgical History:  Procedure Laterality Date  . CATARACT EXTRACTION  W/PHACO Right 09/27/2017   Procedure: CATARACT EXTRACTION PHACO AND INTRAOCULAR LENS PLACEMENT ([place]) RIGHT;  Surgeon: Leandrew Koyanagi, MD;  Location: [place];  Service: Ophthalmology;  Laterality: Right;  IVA TOPICAL DIABETES  . CATARACT EXTRACTION W/PHACO Left 10/18/2017   Procedure: CATARACT EXTRACTION PHACO AND INTRAOCULAR LENS PLACEMENT ([place])  LEFT DIABETIC;  Surgeon: Leandrew Koyanagi, MD;  Location: [place];  Service: Ophthalmology;  Laterality: Left;  DIABETIC-ORAL MED  . COLONOSCOPY  2011   normal- Dr?    Prior to Admission medications   Medication Sig Start Date End Date Taking? Authorizing Provider  aspirin 81 MG tablet Take 81 mg by mouth daily.    [provider]  gabapentin (NEURONTIN) 100 MG capsule Take 1 capsule (100 mg total) by mouth 2 (two) times daily. 12/07/18   Juline Patch, MD  glucose blood (GE100 BLOOD GLUCOSE TEST) test strip 1 each by Other route daily. Use as instructed- Dx E11.9 02/15/17   Juline Patch, MD  hydrochlorothiazide (HYDRODIURIL) 25 MG tablet TAKE 1 TABLET EVERY DAY 08/06/18   Juline Patch, MD  Lancets (FREESTYLE) lancets AS DIRECTED 07/05/16   Juline Patch, MD  megestrol (MEGACE) 20 MG tablet Take 1 tablet (20 mg total) by mouth 2 (two) times daily as needed (Hot flashes). 12/25/18   Stoioff, Ronda Fairly, MD    Allergies  Allergen Reactions  . Lisinopril     coughing coughing    Family History  Family history unknown: Yes    Social History Social History   Tobacco Use  . Smoking status: Never Smoker  .  Smokeless tobacco: Never Used  Substance Use Topics  . Alcohol use: No    Alcohol/week: 0.0 standard drinks  . Drug use: No    Review of Systems Constitutional: Negative for fever. Cardiovascular: Negative for chest pain. Respiratory: Negative for shortness of breath. Gastrointestinal: Negative for abdominal pain Musculoskeletal: Negative for musculoskeletal complaints Neurological:  Negative for headache.  Intermittent dizziness. All other ROS negative  ____________________________________________   PHYSICAL EXAM:  VITAL SIGNS: ED Triage Vitals  Enc Vitals Group     BP 01/12/19 1340 (!) 169/88     Pulse Rate 01/12/19 1340 77     Resp 01/12/19 1340 18     Temp 01/12/19 1340 98.3 F (36.8 C)     Temp Source 01/12/19 1340 Oral     SpO2 01/12/19 1340 100 %     Weight 01/12/19 1341 224 lb (101.6 kg)     Height 01/12/19 1341 6\' 2"  (1.88 m)     Head Circumference --      Peak Flow --      Pain Score 01/12/19 1341 0     Pain Loc --      Pain Edu? --      Excl. in [place]? --    Constitutional: Alert and oriented. Well appearing and in no distress. Eyes: Normal exam ENT      Head: Normocephalic and atraumatic.      Mouth/Throat: Mucous membranes are moist. Cardiovascular: Normal rate, regular rhythm. No murmur Respiratory: Normal respiratory effort without tachypnea nor retractions. Breath sounds are clear Gastrointestinal: Soft and nontender. No distention.  Musculoskeletal: Nontender with normal range of motion in all extremities.  Neurologic:  Normal speech and language. No gross focal neurologic deficits.  5/5 motor in all extremities with great grip strengths.  No cranial nerve deficits. Skin:  Skin is warm, dry and intact.  Psychiatric: Mood and affect are normal.  ____________________________________________    EKG  EKG viewed and interpreted by myself shows a normal sinus rhythm at 66 bpm with a narrow QRS, normal axis, normal intervals, no concerning ST changes.  ____________________________________________    RADIOLOGY  CT head is negative for acute abnormality.  ____________________________________________   INITIAL IMPRESSION / ASSESSMENT AND PLAN / ED COURSE  Pertinent labs & imaging results that were available during my care of the patient were reviewed by me and considered in my medical decision making (see chart for details).    Patient presents emergency department for intermittent dizziness and a tingling/buzzing sensation/sound in his head that last several seconds then goes away.  In between episodes the patient denies any symptoms such as currently.  Overall the patient appears well.  Reassuring physical exam including neurologic exam.  Will obtain labs including urinalysis, CT scan and continue to closely monitor.    Brandon Dillon. was evaluated in Emergency Department on 01/12/2019 for the symptoms described in the history of present illness. He was evaluated in the context of the global COVID-19 pandemic, which necessitated consideration that the patient might be at risk for infection with the SARS-CoV-2 virus that causes COVID-19. Institutional protocols and algorithms that pertain to the evaluation of patients at risk for COVID-19 are in a state of rapid change based on information released by regulatory bodies including the CDC and federal and state organizations. These policies and algorithms were followed during the patient's care in the ED.  ____________________________________________   FINAL CLINICAL IMPRESSION(S) / ED DIAGNOSES  Dizziness   Harvest Dark, MD 01/13/19 2023

## 2019-01-12 NOTE — Discharge Instructions (Addendum)
You need to have additional testing today. Please leave the urgent care and proceed to the emergency department for further evaluation.   Honor Loh, MSN, APRN, FNP-C, CEN Advanced Practice Provider [facility] 01/12/2019 11:41 AM

## 2019-01-15 ENCOUNTER — Encounter: Payer: Self-pay | Admitting: Family Medicine

## 2019-01-15 ENCOUNTER — Ambulatory Visit (INDEPENDENT_AMBULATORY_CARE_PROVIDER_SITE_OTHER): Payer: Medicare Other | Admitting: Family Medicine

## 2019-01-15 ENCOUNTER — Other Ambulatory Visit: Payer: Self-pay

## 2019-01-15 VITALS — BP 130/82 | HR 62 | Ht 76.0 in | Wt 221.0 lb

## 2019-01-15 DIAGNOSIS — Z23 Encounter for immunization: Secondary | ICD-10-CM

## 2019-01-15 DIAGNOSIS — R42 Dizziness and giddiness: Secondary | ICD-10-CM

## 2019-01-15 DIAGNOSIS — H9193 Unspecified hearing loss, bilateral: Secondary | ICD-10-CM

## 2019-01-15 DIAGNOSIS — I10 Essential (primary) hypertension: Secondary | ICD-10-CM | POA: Diagnosis not present

## 2019-01-15 NOTE — Progress Notes (Addendum)
Date:  01/15/2019   Name:  Brandon Dillon.   DOB:  [birth year]   MRN:  [id number]   Chief Complaint: Follow-up (ER visit dizziness- Dx dehydrated), hearing decreased (referral to ENT), pneum 23, and influenza vacc need  .Patient is a 85 year old male who presents for a emergency follow up  exam. The patient reports the following problems: resolution of dizziness. Health maintenance has been reviewed influenza/pneum 23.  Dizziness This is a new problem. The current episode started in the past 7 days. The problem has been resolved. Pertinent negatives include no abdominal pain, anorexia, arthralgias, change in bowel habit, chest pain, chills, congestion, coughing, fatigue, fever, headaches, joint swelling, myalgias, nausea, neck pain, numbness, rash, sore throat, swollen glands, urinary symptoms, vertigo, visual change, vomiting or weakness.    Review of Systems  Constitutional: Negative for chills, fatigue and fever.  HENT: Negative for congestion, drooling, ear discharge, ear pain and sore throat.   Respiratory: Negative for cough, shortness of breath and wheezing.   Cardiovascular: Negative for chest pain, palpitations and leg swelling.  Gastrointestinal: Negative for abdominal pain, anorexia, blood in stool, change in bowel habit, constipation, diarrhea, nausea and vomiting.  Endocrine: Negative for polydipsia.  Genitourinary: Negative for dysuria, frequency, hematuria and urgency.  Musculoskeletal: Negative for arthralgias, back pain, joint swelling, myalgias and neck pain.  Skin: Negative for rash.  Allergic/Immunologic: Negative for environmental allergies.  Neurological: Positive for dizziness. Negative for vertigo, weakness, numbness and headaches.  Hematological: Does not bruise/bleed easily.  Psychiatric/Behavioral: Negative for suicidal ideas. The patient is not nervous/anxious.     Patient Active Problem List   Diagnosis Date Noted  . Leg pain 08/16/2018  . PAD  (peripheral artery disease) ([place]) 08/16/2018  . DJD (degenerative joint disease) 08/16/2018  . Essential hypertension 05/11/2015  . Type 2 diabetes mellitus without complication, without long-term current use of insulin ([place]) 05/11/2015  . Primary prostate adenocarcinoma ([place]) 05/11/2015  . Hyperlipidemia 05/11/2015  . Prostate hypertrophy 05/11/2015  . Taking multiple medications for chronic disease 05/11/2015  . Benign prostatic hyperplasia with urinary obstruction 12/07/2014  . Elevated prostate specific antigen (PSA) 12/04/2014    Allergies  Allergen Reactions  . Lisinopril     coughing coughing    Past Surgical History:  Procedure Laterality Date  . CATARACT EXTRACTION W/PHACO Right 09/27/2017   Procedure: CATARACT EXTRACTION PHACO AND INTRAOCULAR LENS PLACEMENT ([place]) RIGHT;  Surgeon: Leandrew Koyanagi, MD;  Location: [place];  Service: Ophthalmology;  Laterality: Right;  IVA TOPICAL DIABETES  . CATARACT EXTRACTION W/PHACO Left 10/18/2017   Procedure: CATARACT EXTRACTION PHACO AND INTRAOCULAR LENS PLACEMENT ([place])  LEFT DIABETIC;  Surgeon: Leandrew Koyanagi, MD;  Location: [place];  Service: Ophthalmology;  Laterality: Left;  DIABETIC-ORAL MED  . COLONOSCOPY  2011   normal- Dr?    Social History   Tobacco Use  . Smoking status: Never Smoker  . Smokeless tobacco: Never Used  Substance Use Topics  . Alcohol use: No    Alcohol/week: 0.0 standard drinks  . Drug use: No     Medication list has been reviewed and updated.  Current Meds  Medication Sig  . aspirin 81 MG tablet Take 81 mg by mouth daily.  Marland Kitchen gabapentin (NEURONTIN) 100 MG capsule Take 1 capsule (100 mg total) by mouth 2 (two) times daily.  Marland Kitchen glucose blood (GE100 BLOOD GLUCOSE TEST) test strip 1 each by Other route daily. Use as instructed- Dx E11.9  . hydrochlorothiazide (HYDRODIURIL) 25 MG  tablet TAKE 1 TABLET EVERY DAY  . Lancets (FREESTYLE) lancets AS DIRECTED  . megestrol  (MEGACE) 20 MG tablet Take 1 tablet (20 mg total) by mouth 2 (two) times daily as needed (Hot flashes).    PHQ 2/9 Scores 12/07/2018 10/17/2018 08/06/2018 04/04/2018  PHQ - 2 Score 0 0 0 0  PHQ- 9 Score 0 0 0 0    BP Readings from Last 3 Encounters:  01/15/19 130/82  01/12/19 (!) 154/76  01/12/19 (!) 156/84    Physical Exam HENT:     Head: Normocephalic.     Right Ear: Tympanic membrane, ear canal and external ear normal. Decreased hearing noted.     Left Ear: Tympanic membrane, ear canal and external ear normal. Decreased hearing noted.     Nose: Nose normal. No congestion or rhinorrhea.     Mouth/Throat:     Mouth: Mucous membranes are moist.  Eyes:     General: No scleral icterus.       Right eye: No discharge.        Left eye: No discharge.     Conjunctiva/sclera: Conjunctivae normal.     Pupils: Pupils are equal, round, and reactive to light.  Neck:     Musculoskeletal: Normal range of motion and neck supple.     Thyroid: No thyromegaly.     Vascular: No JVD.     Trachea: No tracheal deviation.  Cardiovascular:     Rate and Rhythm: Normal rate and regular rhythm.     Chest Wall: PMI is not displaced. No thrill.     Pulses: Normal pulses.     Heart sounds: Normal heart sounds, S1 normal and S2 normal. No murmur. No systolic murmur. No diastolic murmur. No friction rub. No gallop. No S3 or S4 sounds.   Pulmonary:     Effort: No respiratory distress.     Breath sounds: Normal breath sounds. No decreased breath sounds, wheezing, rhonchi or rales.  Abdominal:     General: Bowel sounds are normal.     Palpations: Abdomen is soft. There is no mass.     Tenderness: There is no abdominal tenderness. There is no guarding or rebound.  Musculoskeletal: Normal range of motion.        General: No tenderness.     Right lower leg: No edema.     Left lower leg: No edema.  Lymphadenopathy:     Cervical: No cervical adenopathy.  Skin:    General: Skin is warm.     Findings: No  rash.  Neurological:     Mental Status: He is alert and oriented to person, place, and time.     Cranial Nerves: No cranial nerve deficit.     Deep Tendon Reflexes: Reflexes are normal and symmetric.     Wt Readings from Last 3 Encounters:  01/15/19 221 lb (100.2 kg)  01/12/19 224 lb (101.6 kg)  01/12/19 224 lb (101.6 kg)    BP 130/82   Pulse 62   Ht 6\' 4"  (1.93 m)   Wt 221 lb (100.2 kg)   BMI 26.90 kg/m    Assessment and Plan:  1. Dizziness Previous noted at urgent care/emergency room visit on 01/12/2019.  This has resolved.  This is a follow-up from emergency room visit and patient was evaluated for possible CVA on above date.  I reviewed his CT scan which was noted to be normal except for age-related changes.  Blood pressure was noted to be in steady range and proper  eating to reduce cholesterol was discussed.  2. Need for 23-polyvalent pneumococcal polysaccharide vaccine Discussed and administered - Pneumococcal polysaccharide vaccine 23-valent greater than or equal to 2yo subcutaneous/IM  3. Influenza vaccine needed Discussed and administered - Flu Vaccine QUAD High Dose(Fluad)  4. Essential hypertension Chronic.  Controlled.  Patient is to continue hydrochlorothiazide 25 mg once a day.  Patient is apparently been using too much sodium with meals and this was discussed with patient.  We will recheck blood pressure in 3 months.  5. Decreased hearing of both ears This is a new onset concern.  Patient's family is noted to decrease in hearing of both ears.  Patient's physical exam notes there is no cerumen impaction.  Patient is aware of this concern and would like referral to ear nose and throat for evaluation. - Ambulatory referral to ENT

## 2019-01-28 DIAGNOSIS — R42 Dizziness and giddiness: Secondary | ICD-10-CM | POA: Diagnosis not present

## 2019-01-28 DIAGNOSIS — I10 Essential (primary) hypertension: Secondary | ICD-10-CM | POA: Diagnosis not present

## 2019-01-28 DIAGNOSIS — I672 Cerebral atherosclerosis: Secondary | ICD-10-CM | POA: Diagnosis not present

## 2019-01-28 DIAGNOSIS — H903 Sensorineural hearing loss, bilateral: Secondary | ICD-10-CM | POA: Diagnosis not present

## 2019-01-28 DIAGNOSIS — E119 Type 2 diabetes mellitus without complications: Secondary | ICD-10-CM | POA: Diagnosis not present

## 2019-01-28 DIAGNOSIS — H6121 Impacted cerumen, right ear: Secondary | ICD-10-CM | POA: Diagnosis not present

## 2019-01-29 ENCOUNTER — Other Ambulatory Visit: Payer: Self-pay | Admitting: Family Medicine

## 2019-01-29 DIAGNOSIS — I10 Essential (primary) hypertension: Secondary | ICD-10-CM

## 2019-02-12 ENCOUNTER — Other Ambulatory Visit: Payer: Self-pay

## 2019-02-12 ENCOUNTER — Ambulatory Visit (INDEPENDENT_AMBULATORY_CARE_PROVIDER_SITE_OTHER): Payer: Medicare Other

## 2019-02-12 DIAGNOSIS — C61 Malignant neoplasm of prostate: Secondary | ICD-10-CM | POA: Diagnosis not present

## 2019-02-12 MED ORDER — LEUPROLIDE ACETATE (6 MONTH) 45 MG ~~LOC~~ KIT
45.0000 mg | PACK | Freq: Once | SUBCUTANEOUS | Status: AC
  Administered 2019-02-12: 45 mg via SUBCUTANEOUS

## 2019-02-12 NOTE — Progress Notes (Signed)
Eligard SubQ Injection   Due to Prostate Cancer patient is present today for a Eligard Injection.  Medication: Eligard 6 month Dose: 45 mg  Location: right  Lot: [id number]  Exp: [id number]  Patient tolerated well, no complications were noted  Performed [id number] [place] CMA   Follow up: 6 months.

## 2019-02-13 ENCOUNTER — Ambulatory Visit: Payer: Medicare Other

## 2019-02-21 ENCOUNTER — Ambulatory Visit (INDEPENDENT_AMBULATORY_CARE_PROVIDER_SITE_OTHER): Payer: Medicare Other

## 2019-02-21 ENCOUNTER — Other Ambulatory Visit: Payer: Self-pay

## 2019-02-21 ENCOUNTER — Ambulatory Visit (INDEPENDENT_AMBULATORY_CARE_PROVIDER_SITE_OTHER): Payer: Medicare Other | Admitting: Nurse Practitioner

## 2019-02-21 ENCOUNTER — Encounter (INDEPENDENT_AMBULATORY_CARE_PROVIDER_SITE_OTHER): Payer: Self-pay | Admitting: Nurse Practitioner

## 2019-02-21 VITALS — BP 168/71 | HR 77 | Resp 20 | Ht 74.0 in | Wt 231.0 lb

## 2019-02-21 DIAGNOSIS — M8949 Other hypertrophic osteoarthropathy, multiple sites: Secondary | ICD-10-CM | POA: Diagnosis not present

## 2019-02-21 DIAGNOSIS — E782 Mixed hyperlipidemia: Secondary | ICD-10-CM | POA: Diagnosis not present

## 2019-02-21 DIAGNOSIS — I10 Essential (primary) hypertension: Secondary | ICD-10-CM | POA: Diagnosis not present

## 2019-02-21 DIAGNOSIS — I739 Peripheral vascular disease, unspecified: Secondary | ICD-10-CM

## 2019-02-21 DIAGNOSIS — M159 Polyosteoarthritis, unspecified: Secondary | ICD-10-CM

## 2019-02-21 NOTE — Progress Notes (Signed)
SUBJECTIVE:  Patient ID: Brandon Berger., male    DOB: 08-13-1927, 85 y.o.   MRN: [id number] Chief Complaint  Patient presents with  . Follow-up    HPI  Brandon Sholar. is a 85 y.o. male The patient returns to the office for followup and review of the noninvasive studies. There have been no interval changes in lower extremity symptoms. No interval shortening of the patient's claudication distance or development of rest pain symptoms. No new ulcers or wounds have occurred since the last visit.  There have been no significant changes to the patient's overall health care.  The patient denies amaurosis fugax or recent TIA symptoms. There are no recent neurological changes noted. The patient denies history of DVT, PE or superficial thrombophlebitis. The patient denies recent episodes of angina or shortness of breath.   ABI Rt=0.80 and Lt=0.99  (previous ABI's Rt=0.73 and Lt=1.09) Duplex ultrasound of the right lower extremity has biphasic waveforms of the right lower extremity tibial arteries and triphasic of the left lower extremity waveforms   Past Medical History:  Diagnosis Date  . Diabetes mellitus without complication ([place])   . Hearing loss   . Hx of fracture of wrist 07/20/2017   >30 years ago. <1989  . Hyperlipidemia   . Hypertension   . Prostate cancer [facility])     Past Surgical History:  Procedure Laterality Date  . CATARACT EXTRACTION W/PHACO Right 09/27/2017   Procedure: CATARACT EXTRACTION PHACO AND INTRAOCULAR LENS PLACEMENT ([place]) RIGHT;  Surgeon: Leandrew Koyanagi, MD;  Location: [place];  Service: Ophthalmology;  Laterality: Right;  IVA TOPICAL DIABETES  . CATARACT EXTRACTION W/PHACO Left 10/18/2017   Procedure: CATARACT EXTRACTION PHACO AND INTRAOCULAR LENS PLACEMENT ([place])  LEFT DIABETIC;  Surgeon: Leandrew Koyanagi, MD;  Location: [place];  Service: Ophthalmology;  Laterality: Left;  DIABETIC-ORAL MED  . COLONOSCOPY  2011   normal-  Dr?    Social History   Socioeconomic History  . Marital status: Married    Spouse name: Not on file  . Number of children: Not on file  . Years of education: Not on file  . Highest education level: Not on file  Occupational History  . Not on file  Social Needs  . Financial resource strain: Not on file  . Food insecurity    Worry: Not on file    Inability: Not on file  . Transportation needs    Medical: Not on file    Non-medical: Not on file  Tobacco Use  . Smoking status: Never Smoker  . Smokeless tobacco: Never Used  Substance and Sexual Activity  . Alcohol use: No    Alcohol/week: 0.0 standard drinks  . Drug use: No  . Sexual activity: Yes  Lifestyle  . Physical activity    Days per week: Not on file    Minutes per session: Not on file  . Stress: Not on file  Relationships  . Social Herbalist on phone: Not on file    Gets together: Not on file    Attends religious service: Not on file    Active member of club or organization: Not on file    Attends meetings of clubs or organizations: Not on file    Relationship status: Not on file  . Intimate partner violence    Fear of current or ex partner: Not on file    Emotionally abused: Not on file    Physically abused: Not on file  Forced sexual activity: Not on file  Other Topics Concern  . Not on file  Social History Narrative  . Not on file    Family History  Family history unknown: Yes    Allergies  Allergen Reactions  . Lisinopril     coughing coughing     Review of Systems   Review of Systems: Negative Unless Checked Constitutional: [] Weight loss  [] Fever  [] Chills Cardiac: [] Chest pain   []  Atrial Fibrillation  [] Palpitations   [] Shortness of breath when laying flat   [] Shortness of breath with exertion. [] Shortness of breath at rest Vascular:  [x] Pain in legs with walking   [] Pain in legs with standing [] Pain in legs when laying flat   [] Claudication    [] Pain in feet when laying  flat    [] History of DVT   [] Phlebitis   [x] Swelling in legs   [] Varicose veins   [] Non-healing ulcers Pulmonary:   [] Uses home oxygen   [] Productive cough   [] Hemoptysis   [] Wheeze  [] COPD   [] Asthma Neurologic:  [] Dizziness   [] Seizures  [] Blackouts [] History of stroke   [] History of TIA  [] Aphasia   [] Temporary Blindness   [] Weakness or numbness in arm   [x] Weakness or numbness in leg Musculoskeletal:   [] Joint swelling   [] Joint pain   [] Low back pain  []  History of Knee Replacement [x] Arthritis [] back Surgeries  []  Spinal Stenosis    Hematologic:  [] Easy bruising  [] Easy bleeding   [] Hypercoagulable state   [] Anemic Gastrointestinal:  [] Diarrhea   [] Vomiting  [] Gastroesophageal reflux/heartburn   [] Difficulty swallowing. [] Abdominal pain Genitourinary:  [] Chronic kidney disease   [] Difficult urination  [] Anuric   [] Blood in urine [] Frequent urination  [] Burning with urination   [] Hematuria Skin:  [] Rashes   [] Ulcers [] Wounds Psychological:  [] History of anxiety   []  History of major depression  []  Memory Difficulties      OBJECTIVE:   Physical Exam  BP (!) 168/71 (BP Location: Right Arm)   Pulse 77   Resp 20   Ht 6\' 2"  (1.88 m)   Wt 231 lb (104.8 kg)   BMI 29.66 kg/m   Gen: WD/WN, NAD Head: [place]/AT, No temporalis wasting.  Ear/Nose/Throat: Hearing grossly intact, nares w/o erythema or drainage Eyes: PER, EOMI, sclera nonicteric.  Neck: Supple, no masses.  No JVD.  Pulmonary:  Good air movement, no use of accessory muscles.  Cardiac: RRR Vascular: 2+ edema bilaterally Vessel Right Left  Radial Palpable Palpable  Dorsalis Pedis Palpable Palpable  Posterior Tibial Palpable Palpable   Gastrointestinal: soft, non-distended. No guarding/no peritoneal signs.  Musculoskeletal: M/S 5/5 throughout.  No deformity or atrophy.  Neurologic: Pain and light touch intact in extremities.  Symmetrical.  Speech is fluent. Motor exam as listed above. Psychiatric: Judgment intact, Mood & affect  appropriate for pt's clinical situation. Dermatologic: No Venous rashes. No Ulcers Noted.  No changes consistent with cellulitis. Lymph : No Cervical lymphadenopathy, no lichenification or skin changes of chronic lymphedema.       ASSESSMENT AND PLAN:  1. PAD (peripheral artery disease) (HCC)  Recommend:  The patient has evidence of atherosclerosis of the lower extremities with claudication.  The patient does not voice lifestyle limiting changes at this point in time.  Noninvasive studies do not suggest clinically significant change.  No invasive studies, angiography or surgery at this time The patient should continue walking and begin a more formal exercise program.  The patient should continue antiplatelet therapy and aggressive treatment of the lipid  abnormalities  No changes in the patient's medications at this time  The patient should continue wearing graduated compression socks 10-15 mmHg strength to control the mild edema.   - VAS [place] ABI WITH/WO TBI; Future  2. Essential hypertension Continue antihypertensive medications as already ordered, these medications have been reviewed and there are no changes at this time.   3. Mixed hyperlipidemia Continue statin as ordered and reviewed, no changes at this time   4. Primary osteoarthritis involving multiple joints Continue NSAID medications as already ordered, these medications have been reviewed and there are no changes at this time.  Continued activity and therapy was stressed.    Current Outpatient Medications on File Prior to Visit  Medication Sig Dispense Refill  . aspirin 81 MG tablet Take 81 mg by mouth daily.    Marland Kitchen gabapentin (NEURONTIN) 100 MG capsule Take 1 capsule (100 mg total) by mouth 2 (two) times daily. 180 capsule 1  . glucose blood (GE100 BLOOD GLUCOSE TEST) test strip 1 each by Other route daily. Use as instructed- Dx E11.9 100 each 1  . hydrochlorothiazide (HYDRODIURIL) 25 MG tablet TAKE 1 TABLET EVERY  DAY 90 tablet 0  . Lancets (FREESTYLE) lancets AS DIRECTED 100 each 0  . megestrol (MEGACE) 20 MG tablet Take 1 tablet (20 mg total) by mouth 2 (two) times daily as needed (Hot flashes). 60 tablet 1  . simvastatin (ZOCOR) 20 MG tablet      No current facility-administered medications on file prior to visit.     There are no Patient Instructions on file for this visit. No follow-ups on file.   Kris Hartmann, NP  This note was completed with Sales executive.  Any errors are purely unintentional.

## 2019-02-28 DIAGNOSIS — H905 Unspecified sensorineural hearing loss: Secondary | ICD-10-CM | POA: Diagnosis not present

## 2019-03-08 ENCOUNTER — Other Ambulatory Visit: Payer: Self-pay | Admitting: Family Medicine

## 2019-03-08 DIAGNOSIS — I10 Essential (primary) hypertension: Secondary | ICD-10-CM

## 2019-05-02 ENCOUNTER — Telehealth: Payer: Self-pay

## 2019-05-02 NOTE — Telephone Encounter (Signed)
Brandon Dillon called yesterday afternoon to report that she had seen Brandon Dillon in home. He had an abnormal clock drawing and she did not see a hx of dementia in his chart. We will address at his upcoming appt with [facility]. [phone]

## 2019-05-17 ENCOUNTER — Other Ambulatory Visit: Payer: Self-pay

## 2019-05-17 ENCOUNTER — Ambulatory Visit (INDEPENDENT_AMBULATORY_CARE_PROVIDER_SITE_OTHER): Payer: Medicare Other | Admitting: Family Medicine

## 2019-05-17 ENCOUNTER — Encounter: Payer: Self-pay | Admitting: Family Medicine

## 2019-05-17 VITALS — BP 130/80 | HR 60 | Ht 74.0 in | Wt 222.0 lb

## 2019-05-17 DIAGNOSIS — G609 Hereditary and idiopathic neuropathy, unspecified: Secondary | ICD-10-CM

## 2019-05-17 DIAGNOSIS — R7303 Prediabetes: Secondary | ICD-10-CM

## 2019-05-17 DIAGNOSIS — I10 Essential (primary) hypertension: Secondary | ICD-10-CM | POA: Diagnosis not present

## 2019-05-17 DIAGNOSIS — R413 Other amnesia: Secondary | ICD-10-CM | POA: Diagnosis not present

## 2019-05-17 DIAGNOSIS — R630 Anorexia: Secondary | ICD-10-CM

## 2019-05-17 DIAGNOSIS — E7801 Familial hypercholesterolemia: Secondary | ICD-10-CM

## 2019-05-17 MED ORDER — SIMVASTATIN 20 MG PO TABS: 20.0000 mg | ORAL_TABLET | Freq: Every morning | ORAL | 1 refills | Status: DC

## 2019-05-17 MED ORDER — MEGESTROL ACETATE 20 MG PO TABS: 20.0000 mg | ORAL_TABLET | Freq: Two times a day (BID) | ORAL | 1 refills | Status: DC | PRN

## 2019-05-17 MED ORDER — HYDROCHLOROTHIAZIDE 25 MG PO TABS: 25.0000 mg | ORAL_TABLET | Freq: Every day | ORAL | 1 refills | Status: DC

## 2019-05-17 MED ORDER — GABAPENTIN 100 MG PO CAPS: 100.0000 mg | ORAL_CAPSULE | Freq: Two times a day (BID) | ORAL | 1 refills | Status: DC

## 2019-05-17 NOTE — Patient Instructions (Signed)
Management of Memory Problems  There are some general things you can do to help manage your memory problems.  Your memory may not in fact recover, but by using techniques and strategies you will be able to manage your memory difficulties better.  1)  Establish a routine.  Try to establish and then stick to a regular routine.  By doing this, you will get used to what to expect and you will reduce the need to rely on your memory.  Also, try to do things at the same time of day, such as taking your medication or checking your calendar first thing in the morning.  Think about think that you can do as a part of a regular routine and make a list.  Then enter them into a daily planner to remind you.  This will help you establish a routine.  2)  Organize your environment.  Organize your environment so that it is uncluttered.  Decrease visual stimulation.  Place everyday items such as keys or cell phone in the same place every day (ie.  Basket next to front door)  Use post it notes with a brief message to yourself (ie. Turn off light, lock the door)  Use labels to indicate where things go (ie. Which cupboards are for food, dishes, etc.)  Keep a notepad and pen by the telephone to take messages  3)  Memory Aids  A diary or journal/notebook/daily planner  Making a list (shopping list, chore list, to do list that needs to be done)  Using an alarm as a reminder (kitchen timer or cell phone alarm)  Using cell phone to store information (Notes, Calendar, Reminders)  Calendar/White board placed in a prominent position  Post-it notes  In order for memory aids to be useful, you need to have good habits.  It's no good remembering to make a note in your journal if you don't remember to look in it.  Try setting aside a certain time of day to look in journal.  4)  Improving mood and managing fatigue.  There may be other factors that contribute to memory difficulties.  Factors, such as anxiety,  depression and tiredness can affect memory.  Regular gentle exercise can help improve your mood and give you more energy.  Simple relaxation techniques may help relieve symptoms of anxiety  Try to get back to completing activities or hobbies you enjoyed doing in the past.  Learn to pace yourself through activities to decrease fatigue.  Find out about some local support groups where you can share experiences with others. Try and achieve 7-8 hours of sleep at night.   Why follow it? Research shows. . Those who follow the Mediterranean diet have a reduced risk of heart disease  . The diet is associated with a reduced incidence of Parkinson's and Alzheimer's diseases . People following the diet may have longer life expectancies and lower rates of chronic diseases  . The Dietary Guidelines for Americans recommends the Mediterranean diet as an eating plan to promote health and prevent disease  What Is the Mediterranean Diet?  . Healthy eating plan based on typical foods and recipes of Mediterranean-style cooking . The diet is primarily a plant based diet; these foods should make up a majority of meals   Starches - Plant based foods should make up a majority of meals - They are an important sources of vitamins, minerals, energy, antioxidants, and fiber - Choose whole grains, foods high in fiber and minimally processed items  -  Typical grain sources include wheat, oats, barley, corn, Neco rice, bulgar, farro, millet, polenta, couscous  - Various types of beans include chickpeas, lentils, fava beans, black beans, white beans   Fruits  Veggies - Large quantities of antioxidant rich fruits & veggies; 6 or more servings  - Vegetables can be eaten raw or lightly drizzled with oil and cooked  - Vegetables common to the traditional Mediterranean Diet include: artichokes, arugula, beets, broccoli, brussel sprouts, cabbage, carrots, celery, collard greens, cucumbers, eggplant, kale, leeks, lemons,  lettuce, mushrooms, okra, onions, peas, peppers, potatoes, pumpkin, radishes, rutabaga, shallots, spinach, sweet potatoes, turnips, zucchini - Fruits common to the Mediterranean Diet include: apples, apricots, avocados, cherries, clementines, dates, figs, grapefruits, grapes, melons, nectarines, oranges, peaches, pears, pomegranates, strawberries, tangerines  Fats - Replace butter and margarine with healthy oils, such as olive oil, canola oil, and tahini  - Limit nuts to no more than a handful a day  - Nuts include walnuts, almonds, pecans, pistachios, pine nuts  - Limit or avoid candied, honey roasted or heavily salted nuts - Olives are central to the [employer] - can be eaten whole or used in a variety of dishes   Meats Protein - Limiting red meat: no more than a few times a month - When eating red meat: choose lean cuts and keep the portion to the size of deck of cards - Eggs: approx. 0 to 4 times a week  - Fish and lean poultry: at least 2 a week  - Healthy protein sources include, chicken, [place], lean beef, lamb - Increase intake of seafood such as tuna, salmon, trout, mackerel, shrimp, scallops - Avoid or limit high fat processed meats such as sausage and bacon  Dairy - Include moderate amounts of low fat dairy products  - Focus on healthy dairy such as fat free yogurt, skim milk, low or reduced fat cheese - Limit dairy products higher in fat such as whole or 2% milk, cheese, ice cream  Alcohol - Moderate amounts of red wine is ok  - No more than 5 oz daily for women (all ages) and men older than age 75  - No more than 10 oz of wine daily for men younger than 7  Other - Limit sweets and other desserts  - Use herbs and spices instead of salt to flavor foods  - Herbs and spices common to the traditional Mediterranean Diet include: basil, bay leaves, chives, cloves, cumin, fennel, garlic, lavender, marjoram, mint, oregano, parsley, pepper, rosemary, sage, savory, sumac, tarragon,  thyme   It's not just a diet, it's a lifestyle:  . The Mediterranean diet includes lifestyle factors typical of those in the region  . Foods, drinks and meals are best eaten with others and savored . Daily physical activity is important for overall good health . This could be strenuous exercise like running and aerobics . This could also be more leisurely activities such as walking, housework, yard-work, or taking the stairs . Moderation is the key; a balanced and healthy diet accommodates most foods and drinks . Consider portion sizes and frequency of consumption of certain foods   Meal Ideas & Options:  . Breakfast:  o Whole wheat toast or whole wheat English muffins with peanut butter & hard boiled egg o Steel cut oats topped with apples & cinnamon and skim milk  o Fresh fruit: banana, strawberries, melon, berries, peaches  o Smoothies: strawberries, bananas, greek yogurt, peanut butter o Low fat greek yogurt with blueberries and granola  o Egg white omelet with spinach and mushrooms o Breakfast couscous: whole wheat couscous, apricots, skim milk, cranberries  . Sandwiches:  o Hummus and grilled vegetables (peppers, zucchini, squash) on whole wheat bread   o Grilled chicken on whole wheat pita with lettuce, tomatoes, cucumbers or tzatziki  o Tuna salad on whole wheat bread: tuna salad made with greek yogurt, olives, red peppers, capers, green onions o Garlic rosemary lamb pita: lamb sauted with garlic, rosemary, salt & pepper; add lettuce, cucumber, greek yogurt to pita - flavor with lemon juice and black pepper  . Seafood:  o Mediterranean grilled salmon, seasoned with garlic, basil, parsley, lemon juice and black pepper o Shrimp, lemon, and spinach whole-grain pasta salad made with low fat greek yogurt  o Seared scallops with lemon orzo  o Seared tuna steaks seasoned salt, pepper, coriander topped with tomato mixture of olives, tomatoes, olive oil, minced garlic, parsley, green  onions and cappers  . Meats:  o Herbed greek chicken salad with kalamata olives, cucumber, feta  o Red bell peppers stuffed with spinach, bulgur, lean ground beef (or lentils) & topped with feta   o Kebabs: skewers of chicken, tomatoes, onions, zucchini, squash  o [place] burgers: made with red onions, mint, dill, lemon juice, feta cheese topped with roasted red peppers . Vegetarian o Cucumber salad: cucumbers, artichoke hearts, celery, red onion, feta cheese, tossed in olive oil & lemon juice  o Hummus and whole grain pita points with a greek salad (lettuce, tomato, feta, olives, cucumbers, red onion) o Lentil soup with celery, carrots made with vegetable broth, garlic, salt and pepper  o Tabouli salad: parsley, bulgur, mint, scallions, cucumbers, tomato, radishes, lemon juice, olive oil, salt and pepper.

## 2019-05-17 NOTE — Progress Notes (Signed)
Date:  05/17/2019   Name:  Brandon Dillon.   DOB:  1928-03-09   MRN:  [id number]   Chief Complaint: Hypertension, Hyperlipidemia, and Diabetes  Hypertension This is a chronic problem. The current episode started more than 1 year ago. The problem has been gradually improving since onset. The problem is controlled. Pertinent negatives include no anxiety, blurred vision, chest pain, headaches, malaise/fatigue, neck pain, orthopnea, palpitations, peripheral edema, PND, shortness of breath or sweats. There are no associated agents to hypertension. Risk factors for coronary artery disease include dyslipidemia. Past treatments include diuretics. The current treatment provides moderate improvement. There are no compliance problems.  There is no history of angina, kidney disease, CAD/MI, CVA, heart failure, left ventricular hypertrophy, PVD or retinopathy. There is no history of chronic renal disease, a hypertension causing med or renovascular disease.  Hyperlipidemia This is a chronic problem. The current episode started more than 1 year ago. The problem is controlled. Recent lipid tests were reviewed and are normal. He has no history of chronic renal disease, diabetes, hypothyroidism, liver disease, obesity or nephrotic syndrome. Factors aggravating his hyperlipidemia include thiazides. Pertinent negatives include no chest pain, focal sensory loss, focal weakness, leg pain, myalgias or shortness of breath. Current antihyperlipidemic treatment includes statins. The current treatment provides mild improvement of lipids. There are no compliance problems.  Risk factors for coronary artery disease include diabetes mellitus, dyslipidemia, hypertension, male sex and post-menopausal.  Diabetes He presents for his follow-up (for prediabetes) diabetic visit. He has type 2 diabetes mellitus. His disease course has been stable. Pertinent negatives for hypoglycemia include no dizziness, headaches,  nervousness/anxiousness or sweats. Pertinent negatives for diabetes include no blurred vision, no chest pain, no fatigue, no foot paresthesias, no foot ulcerations, no polydipsia, no polyphagia, no polyuria, no visual change, no weakness and no weight loss. Symptoms are improving. Diabetic complications include peripheral neuropathy. Pertinent negatives for diabetic complications include no autonomic neuropathy, CVA, heart disease, impotence, nephropathy, PVD or retinopathy. His weight is stable. He is following a generally healthy diet. Meal planning includes avoidance of concentrated sweets and carbohydrate counting. (Not checking) An ACE inhibitor/angiotensin II receptor blocker is not being taken.  Neurologic Problem The patient's pertinent negatives include no altered mental status, clumsiness, focal sensory loss, focal weakness, loss of balance, memory loss, near-syncope, slurred speech, syncope, visual change or weakness. This is a new problem. The problem has been waxing and waning since onset. Pertinent negatives include no abdominal pain, back pain, chest pain, dizziness, fatigue, fever, headaches, nausea, neck pain, palpitations or shortness of breath. There is no history of liver disease.    Lab Results  Component Value Date   CREATININE 1.26 (H) 01/12/2019   BUN 18 01/12/2019   NA 138 01/12/2019   K 3.7 01/12/2019   CL 102 01/12/2019   CO2 26 01/12/2019   Lab Results  Component Value Date   CHOL 231 (H) 10/17/2018   HDL 49 10/17/2018   LDLCALC 157 (H) 10/17/2018   TRIG 123 10/17/2018   CHOLHDL 3.2 04/04/2018   No results found for: TSH Lab Results  Component Value Date   HGBA1C 5.6 12/07/2018     Review of Systems  Constitutional: Negative for chills, fatigue, fever, malaise/fatigue and weight loss.  HENT: Negative for drooling, ear discharge, ear pain and sore throat.   Eyes: Negative for blurred vision.  Respiratory: Negative for cough, shortness of breath and  wheezing.   Cardiovascular: Negative for chest pain, palpitations, orthopnea, leg swelling,  PND and near-syncope.  Gastrointestinal: Negative for abdominal pain, blood in stool, constipation, diarrhea and nausea.  Endocrine: Negative for polydipsia, polyphagia and polyuria.  Genitourinary: Negative for dysuria, frequency, hematuria, impotence and urgency.  Musculoskeletal: Negative for back pain, myalgias and neck pain.  Skin: Negative for rash.  Allergic/Immunologic: Negative for environmental allergies.  Neurological: Negative for dizziness, focal weakness, syncope, weakness, headaches and loss of balance.  Hematological: Does not bruise/bleed easily.  Psychiatric/Behavioral: Negative for memory loss and suicidal ideas. The patient is not nervous/anxious.     Patient Active Problem List   Diagnosis Date Noted  . Leg pain 08/16/2018  . PAD (peripheral artery disease) ([place]) 08/16/2018  . DJD (degenerative joint disease) 08/16/2018  . Essential hypertension 05/11/2015  . Type 2 diabetes mellitus without complication, without long-term current use of insulin ([place]) 05/11/2015  . Primary prostate adenocarcinoma ([place]) 05/11/2015  . Hyperlipidemia 05/11/2015  . Prostate hypertrophy 05/11/2015  . Taking multiple medications for chronic disease 05/11/2015  . Benign prostatic hyperplasia with urinary obstruction 12/07/2014  . Elevated prostate specific antigen (PSA) 12/04/2014    Allergies  Allergen Reactions  . Lisinopril     coughing coughing    Past Surgical History:  Procedure Laterality Date  . CATARACT EXTRACTION W/PHACO Right 09/27/2017   Procedure: CATARACT EXTRACTION PHACO AND INTRAOCULAR LENS PLACEMENT ([place]) RIGHT;  Surgeon: Leandrew Koyanagi, MD;  Location: [place];  Service: Ophthalmology;  Laterality: Right;  IVA TOPICAL DIABETES  . CATARACT EXTRACTION W/PHACO Left 10/18/2017   Procedure: CATARACT EXTRACTION PHACO AND INTRAOCULAR LENS PLACEMENT ([place])  LEFT  DIABETIC;  Surgeon: Leandrew Koyanagi, MD;  Location: [facility];  Service: Ophthalmology;  Laterality: Left;  DIABETIC-ORAL MED  . COLONOSCOPY  2011   normal- Dr?    Social History   Tobacco Use  . Smoking status: Never Smoker  . Smokeless tobacco: Never Used  Substance Use Topics  . Alcohol use: No    Alcohol/week: 0.0 standard drinks  . Drug use: No     Medication list has been reviewed and updated.  Current Meds  Medication Sig  . aspirin 81 MG tablet Take 81 mg by mouth daily.  Marland Kitchen gabapentin (NEURONTIN) 100 MG capsule Take 1 capsule (100 mg total) by mouth 2 (two) times daily.  Marland Kitchen glucose blood (GE100 BLOOD GLUCOSE TEST) test strip 1 each by Other route daily. Use as instructed- Dx E11.9  . hydrochlorothiazide (HYDRODIURIL) 25 MG tablet TAKE 1 TABLET EVERY DAY  . Lancets (FREESTYLE) lancets AS DIRECTED  . megestrol (MEGACE) 20 MG tablet Take 1 tablet (20 mg total) by mouth 2 (two) times daily as needed (Hot flashes).  . simvastatin (ZOCOR) 20 MG tablet     PHQ 2/9 Scores 05/17/2019 12/07/2018 10/17/2018 08/06/2018  PHQ - 2 Score 0 0 0 0  PHQ- 9 Score 0 0 0 0    BP Readings from Last 3 Encounters:  05/17/19 130/80  02/21/19 (!) 168/71  01/15/19 130/82    Physical Exam Vitals and nursing note reviewed.  HENT:     Head: Normocephalic.     Right Ear: Tympanic membrane and external ear normal.     Left Ear: Tympanic membrane and external ear normal.     Nose: Nose normal.  Eyes:     General: No scleral icterus.       Right eye: No discharge.        Left eye: No discharge.     Conjunctiva/sclera: Conjunctivae normal.     Pupils: Pupils  are equal, round, and reactive to light.  Neck:     Thyroid: No thyromegaly.     Vascular: No JVD.     Trachea: No tracheal deviation.  Cardiovascular:     Rate and Rhythm: Normal rate and regular rhythm.     Heart sounds: Normal heart sounds. No murmur. No friction rub. No gallop.   Pulmonary:     Effort: No  respiratory distress.     Breath sounds: Normal breath sounds. No wheezing or rales.  Abdominal:     General: Bowel sounds are normal.     Palpations: Abdomen is soft. There is no mass.     Tenderness: There is no abdominal tenderness. There is no guarding or rebound.  Musculoskeletal:        General: No tenderness. Normal range of motion.     Cervical back: Normal range of motion and neck supple.  Lymphadenopathy:     Cervical: No cervical adenopathy.  Skin:    General: Skin is warm.     Findings: No rash.  Neurological:     Mental Status: He is alert and oriented to person, place, and time.     Cranial Nerves: No cranial nerve deficit.     Deep Tendon Reflexes: Reflexes are normal and symmetric.     Wt Readings from Last 3 Encounters:  05/17/19 222 lb (100.7 kg)  02/21/19 231 lb (104.8 kg)  01/15/19 221 lb (100.2 kg)    BP 130/80   Pulse 60   Ht 6\' 2"  (1.88 m)   Wt 222 lb (100.7 kg)   BMI 28.50 kg/m   Assessment and Plan:

## 2019-05-18 LAB — LIPID PANEL WITH LDL/HDL RATIO
Cholesterol, Total: 180 mg/dL (ref 100–199)
HDL: 54 mg/dL (ref >39)
LDL Chol Calc (NIH): 109 mg/dL — ABNORMAL HIGH (ref 0–99)
LDL/HDL Ratio: 2 ratio (ref 0.0–3.6)
Triglycerides: 94 mg/dL (ref 0–149)
VLDL Cholesterol Cal: 17 mg/dL (ref 5–40)

## 2019-05-18 LAB — RENAL FUNCTION PANEL
Albumin: 4.6 g/dL (ref 3.6–4.6)
BUN/Creatinine Ratio: 14 (ref 10–24)
BUN: 17 mg/dL (ref 8–27)
CO2: 23 mmol/L (ref 20–29)
Calcium: 9.7 mg/dL (ref 8.6–10.2)
Chloride: 104 mmol/L (ref 96–106)
Creatinine, Ser: 1.25 mg/dL (ref 0.76–1.27)
GFR calc Af Amer: 59 mL/min/{1.73_m2} — ABNORMAL LOW (ref >59)
GFR calc non Af Amer: 51 mL/min/{1.73_m2} — ABNORMAL LOW (ref >59)
Glucose: 104 mg/dL — ABNORMAL HIGH (ref 65–99)
Phosphorus: 4 mg/dL (ref 2.8–4.1)
Potassium: 4.3 mmol/L (ref 3.5–5.2)
Sodium: 142 mmol/L (ref 134–144)

## 2019-05-18 LAB — HEMOGLOBIN A1C
Est. average glucose Bld gHb Est-mCnc: 111 mg/dL
Hgb A1c MFr Bld: 5.5 % (ref 4.8–5.6)

## 2019-05-18 LAB — MICROALBUMIN, URINE: Microalbumin, Urine: 7.7 ug/mL

## 2019-06-03 DIAGNOSIS — E119 Type 2 diabetes mellitus without complications: Secondary | ICD-10-CM | POA: Diagnosis not present

## 2019-06-12 ENCOUNTER — Ambulatory Visit: Payer: Medicare Other | Admitting: Family Medicine

## 2019-06-17 ENCOUNTER — Other Ambulatory Visit: Payer: Self-pay

## 2019-08-13 ENCOUNTER — Ambulatory Visit (INDEPENDENT_AMBULATORY_CARE_PROVIDER_SITE_OTHER): Payer: Medicare Other | Admitting: Urology

## 2019-08-13 ENCOUNTER — Encounter: Payer: Self-pay | Admitting: Urology

## 2019-08-13 ENCOUNTER — Telehealth: Payer: Self-pay | Admitting: Urology

## 2019-08-13 ENCOUNTER — Other Ambulatory Visit: Payer: Self-pay

## 2019-08-13 VITALS — BP 130/72 | HR 84 | Ht 71.0 in | Wt 222.0 lb

## 2019-08-13 DIAGNOSIS — C61 Malignant neoplasm of prostate: Secondary | ICD-10-CM | POA: Diagnosis not present

## 2019-08-13 MED ORDER — LEUPROLIDE ACETATE (6 MONTH) 45 MG ~~LOC~~ KIT
45.0000 mg | PACK | Freq: Once | SUBCUTANEOUS | Status: AC
  Administered 2019-08-13: 45 mg via SUBCUTANEOUS

## 2019-08-13 NOTE — Progress Notes (Signed)
Eligard SubQ Injection   Due to Prostate Cancer patient is present today for a Eligard Injection.  Medication: Eligard 6 month Dose: 45 mg  Location: left side upper outer quad Lot: [id number] Exp: 02/2021  Patient tolerated well, no complications were noted  Performed by: Fonnie Jarvis, CMA  Per Dr. Bernardo Heater patient is to continue therapy for 2years . Patient's next follow up was scheduled for 02/10/2020. This appointment was scheduled using wheel and given to patient today along with reminder continue on Vitamin D 800-1000iu and Calium 1000-1200mg  daily while on Androgen Deprivation Therapy.

## 2019-08-13 NOTE — Telephone Encounter (Signed)
LUPRON/ELIGARD APPROVED [id number] 08-13-19 THRU 08-12-2020   MICHELLE

## 2019-08-14 ENCOUNTER — Other Ambulatory Visit: Payer: Self-pay | Admitting: Urology

## 2019-08-14 ENCOUNTER — Encounter: Payer: Self-pay | Admitting: Urology

## 2019-08-14 ENCOUNTER — Telehealth: Payer: Self-pay | Admitting: *Deleted

## 2019-08-14 DIAGNOSIS — C61 Malignant neoplasm of prostate: Secondary | ICD-10-CM

## 2019-08-14 LAB — PSA: Prostate Specific Ag, Serum: 1.2 ng/mL (ref 0.0–4.0)

## 2019-08-14 NOTE — Telephone Encounter (Signed)
-----   Message from Abbie Sons, MD sent at 08/14/2019  1:52 PM EDT ----- PSA has increased to 1.2.  Recommend oncology appointment since PSA rising on leuprolide.  Referral placed

## 2019-08-14 NOTE — Telephone Encounter (Signed)
Notified patient as instructed, patient pleased. Discussed follow-up appointments, patient agrees  

## 2019-08-14 NOTE — Progress Notes (Signed)
08/13/2019 7:11 AM   Brandon Dillon. 11/29/1927 [id number]  Referring provider: Juline Patch, MD [address] [place],  [place] [address]  Chief Complaint  Patient presents with  . Prostate Cancer    HPI: Brandon Dillon is an 86 y.o. male diagnosed with Gleason 3+3 adenocarcinoma the prostate October 2016.  Staging evaluation was consistent with metastatic disease of the sacrum and pelvis on bone scan and CT.  He was started on ADT.  He has been receiving leuprolide every 6 months however last saw me in March 2019.  His only voiding complaint is nocturia x2-3.  Denies dysuria or gross hematuria.  Denies flank, abdominal or pelvic pain.  Last PSA performed July 2019 was 0.5.   PMH: Past Medical History:  Diagnosis Date  . Diabetes mellitus without complication ([place])   . Hearing loss   . Hx of fracture of wrist 07/20/2017   >30 years ago. <1989  . Hyperlipidemia   . Hypertension   . Prostate cancer [facility])     Surgical History: Past Surgical History:  Procedure Laterality Date  . CATARACT EXTRACTION W/PHACO Right 09/27/2017   Procedure: CATARACT EXTRACTION PHACO AND INTRAOCULAR LENS PLACEMENT ([place]) RIGHT;  Surgeon: Leandrew Koyanagi, MD;  Location: [place];  Service: Ophthalmology;  Laterality: Right;  IVA TOPICAL DIABETES  . CATARACT EXTRACTION W/PHACO Left 10/18/2017   Procedure: CATARACT EXTRACTION PHACO AND INTRAOCULAR LENS PLACEMENT ([place])  LEFT DIABETIC;  Surgeon: Leandrew Koyanagi, MD;  Location: [place];  Service: Ophthalmology;  Laterality: Left;  DIABETIC-ORAL MED  . COLONOSCOPY  2011   normal- Dr?    Home Medications:  Allergies as of 08/13/2019      Reactions   Lisinopril    coughing coughing      Medication List       Accurate as of August 13, 2019 11:59 PM. If you have any questions, ask your nurse or doctor.        aspirin 81 MG tablet Take 81 mg by mouth daily.   freestyle lancets AS DIRECTED     gabapentin 100 MG capsule Commonly known as: NEURONTIN Take 1 capsule (100 mg total) by mouth 2 (two) times daily.   glucose blood test strip Commonly known as: GE100 Blood Glucose Test 1 each by Other route daily. Use as instructed- Dx E11.9   hydrochlorothiazide 25 MG tablet Commonly known as: HYDRODIURIL Take 1 tablet (25 mg total) by mouth daily.   megestrol 20 MG tablet Commonly known as: MEGACE Take 1 tablet (20 mg total) by mouth 2 (two) times daily as needed (Hot flashes).   simvastatin 20 MG tablet Commonly known as: ZOCOR Take 1 tablet (20 mg total) by mouth every morning.       Allergies:  Allergies  Allergen Reactions  . Lisinopril     coughing coughing    Family History: Family History  Family history unknown: Yes    Social History:  reports that he has never smoked. He has never used smokeless tobacco. He reports that he does not drink alcohol or use drugs.   Physical Exam: BP 130/72   Pulse 84   Ht 5\' 11"  (1.803 m)   Wt 222 lb (100.7 kg)   BMI 30.96 kg/m   Constitutional:  Alert and oriented, No acute distress. HEENT: [place] AT, moist mucus membranes.  Trachea midline, no masses. Cardiovascular: No clubbing, cyanosis, or edema. Respiratory: Normal respiratory effort, no increased work of breathing. Skin: No rashes, bruises or  suspicious lesions. Neurologic: Grossly intact, no focal deficits, moving all 4 extremities. Psychiatric: Normal mood and affect.   Assessment & Plan:    - Prostate cancer He received a leuprolide injection today.  PSA drawn he will be notified with the results.  Tentative follow-up 6 months for leuprolide and 1 year office visit.    Abbie Sons, [place] [address], [place], [place] [address] [phone]

## 2019-08-19 ENCOUNTER — Encounter: Payer: Self-pay | Admitting: Oncology

## 2019-08-19 ENCOUNTER — Other Ambulatory Visit: Payer: Self-pay

## 2019-08-19 ENCOUNTER — Inpatient Hospital Stay: Payer: Medicare Other

## 2019-08-19 ENCOUNTER — Inpatient Hospital Stay: Payer: Medicare Other | Attending: Oncology | Admitting: Oncology

## 2019-08-19 VITALS — BP 137/74 | HR 78 | Temp 97.2°F | Resp 18 | Wt 237.9 lb

## 2019-08-19 DIAGNOSIS — Z7189 Other specified counseling: Secondary | ICD-10-CM

## 2019-08-19 DIAGNOSIS — Z79899 Other long term (current) drug therapy: Secondary | ICD-10-CM | POA: Diagnosis not present

## 2019-08-19 DIAGNOSIS — I10 Essential (primary) hypertension: Secondary | ICD-10-CM | POA: Insufficient documentation

## 2019-08-19 DIAGNOSIS — C61 Malignant neoplasm of prostate: Secondary | ICD-10-CM | POA: Diagnosis present

## 2019-08-19 DIAGNOSIS — C7951 Secondary malignant neoplasm of bone: Secondary | ICD-10-CM | POA: Insufficient documentation

## 2019-08-19 NOTE — Progress Notes (Signed)
Patient here for initial oncology appointment, expresses no complaints or concerns at this time.   

## 2019-08-21 ENCOUNTER — Encounter: Payer: Self-pay | Admitting: Oncology

## 2019-08-21 DIAGNOSIS — Z7189 Other specified counseling: Secondary | ICD-10-CM | POA: Insufficient documentation

## 2019-08-21 NOTE — Progress Notes (Signed)
Hematology/Oncology Consult note [facility] Telephone:([phone] [phone]  Patient Care Team: Juline Patch, MD as PCP - General (Family Medicine)   Name of the patient: Brandon Dillon  [id number]  June 21, 1927    Reason for referral- prostate cancer   Referring physician- DR. Stoioff  Date of visit: 08/21/19   History of presenting illness- Patient is a 86 year old male who was diagnosed with adenocarcinoma of the prostate Gleason 3+ 26 January 2015.  At that time he had staging evaluation consistent with metastatic disease to the sacrum and pelvis and was started on Lupron every 6 months.  Last bone scan done on March 2019 again showed metastatic involvement of the right acetabulum and iliac wing and right sacrum.  Patient has been responding to Lupron well and his PSA went down from 6.6 in 20 16-0.5 in 2019.  It went up to 1.2 on 08/13/2019.  Patient has therefore been referred to [place] for consideration of additional treatments for prostate cancer patient is doing well for his age and remains independent of his ADLs.  He does report some chronic fatigue.  His other past medical history is significant for hypertension hyperlipidemia and diabetes.  Denies any new aches or pains at this time  ECOG PS- 1  Pain scale- 0   Review of systems- Review of Systems  Constitutional: Positive for malaise/fatigue. Negative for chills, fever and weight loss.  HENT: Negative for congestion, ear discharge and nosebleeds.   Eyes: Negative for blurred vision.  Respiratory: Negative for cough, hemoptysis, sputum production, shortness of breath and wheezing.   Cardiovascular: Negative for chest pain, palpitations, orthopnea and claudication.  Gastrointestinal: Negative for abdominal pain, blood in stool, constipation, diarrhea, heartburn, melena, nausea and vomiting.  Genitourinary: Negative for dysuria, flank pain, frequency, hematuria and urgency.  Musculoskeletal:  Negative for back pain, joint pain and myalgias.  Skin: Negative for rash.  Neurological: Negative for dizziness, tingling, focal weakness, seizures, weakness and headaches.  Endo/Heme/Allergies: Does not bruise/bleed easily.  Psychiatric/Behavioral: Negative for depression and suicidal ideas. The patient does not have insomnia.     Allergies  Allergen Reactions  . Lisinopril     coughing coughing    Patient Active Problem List   Diagnosis Date Noted  . Leg pain 08/16/2018  . PAD (peripheral artery disease) ([place]) 08/16/2018  . DJD (degenerative joint disease) 08/16/2018  . Essential hypertension 05/11/2015  . Primary prostate adenocarcinoma ([place]) 05/11/2015  . Hyperlipidemia 05/11/2015  . Prostate hypertrophy 05/11/2015  . Taking multiple medications for chronic disease 05/11/2015  . Benign prostatic hyperplasia with urinary obstruction 12/07/2014  . Elevated prostate specific antigen (PSA) 12/04/2014     Past Medical History:  Diagnosis Date  . Diabetes mellitus without complication ([place])   . Hearing loss   . Hx of fracture of wrist 07/20/2017   >30 years ago. <1989  . Hyperlipidemia   . Hypertension   . Prostate cancer [facility])      Past Surgical History:  Procedure Laterality Date  . CATARACT EXTRACTION W/PHACO Right 09/27/2017   Procedure: CATARACT EXTRACTION PHACO AND INTRAOCULAR LENS PLACEMENT ([place]) RIGHT;  Surgeon: Leandrew Koyanagi, MD;  Location: [place];  Service: Ophthalmology;  Laterality: Right;  IVA TOPICAL DIABETES  . CATARACT EXTRACTION W/PHACO Left 10/18/2017   Procedure: CATARACT EXTRACTION PHACO AND INTRAOCULAR LENS PLACEMENT ([place])  LEFT DIABETIC;  Surgeon: Leandrew Koyanagi, MD;  Location: [place];  Service: Ophthalmology;  Laterality: Left;  DIABETIC-ORAL MED  . COLONOSCOPY  2011  normal- Dr?    Social History   Socioeconomic History  . Marital status: Married    Spouse name: Not on file  . Number of children: Not  on file  . Years of education: Not on file  . Highest education level: Not on file  Occupational History  . Not on file  Tobacco Use  . Smoking status: Never Smoker  . Smokeless tobacco: Never Used  Substance and Sexual Activity  . Alcohol use: No    Alcohol/week: 0.0 standard drinks  . Drug use: No  . Sexual activity: Yes  Other Topics Concern  . Not on file  Social History Narrative  . Not on file   Social Determinants of Health   Financial Resource Strain:   . Difficulty of Paying Living Expenses:   Food Insecurity:   . Worried About Charity fundraiser in the Last Year:   . Arboriculturist in the Last Year:   Transportation Needs:   . Film/video editor (Medical):   Marland Kitchen Lack of Transportation (Non-Medical):   Physical Activity:   . Days of Exercise per Week:   . Minutes of Exercise per Session:   Stress:   . Feeling of Stress :   Social Connections:   . Frequency of Communication with Friends and Family:   . Frequency of Social Gatherings with Friends and Family:   . Attends Religious Services:   . Active Member of Clubs or Organizations:   . Attends Archivist Meetings:   Marland Kitchen Marital Status:   Intimate Partner Violence:   . Fear of Current or Ex-Partner:   . Emotionally Abused:   Marland Kitchen Physically Abused:   . Sexually Abused:      Family History  Family history unknown: Yes     Current Outpatient Medications:  .  aspirin 81 MG tablet, Take 81 mg by mouth daily., Disp: , Rfl:  .  gabapentin (NEURONTIN) 100 MG capsule, Take 1 capsule (100 mg total) by mouth 2 (two) times daily., Disp: 180 capsule, Rfl: 1 .  glucose blood (GE100 BLOOD GLUCOSE TEST) test strip, 1 each by Other route daily. Use as instructed- Dx E11.9, Disp: 100 each, Rfl: 1 .  hydrochlorothiazide (HYDRODIURIL) 25 MG tablet, Take 1 tablet (25 mg total) by mouth daily., Disp: 90 tablet, Rfl: 1 .  Lancets (FREESTYLE) lancets, AS DIRECTED, Disp: 100 each, Rfl: 0 .  megestrol (MEGACE) 20 MG  tablet, Take 1 tablet (20 mg total) by mouth 2 (two) times daily as needed (Hot flashes)., Disp: 60 tablet, Rfl: 1 .  simvastatin (ZOCOR) 20 MG tablet, Take 1 tablet (20 mg total) by mouth every morning., Disp: 90 tablet, Rfl: 1   Physical exam:  Vitals:   08/19/19 1123  BP: 137/74  Pulse: 78  Resp: 18  Temp: (!) 97.2 F (36.2 C)  SpO2: 98%  Weight: 237 lb 14.4 oz (107.9 kg)   Physical Exam Constitutional:      General: He is not in acute distress. HENT:     Head: Normocephalic and atraumatic.  Eyes:     Pupils: Pupils are equal, round, and reactive to light.  Cardiovascular:     Rate and Rhythm: Normal rate and regular rhythm.     Heart sounds: Normal heart sounds.  Pulmonary:     Effort: Pulmonary effort is normal.     Breath sounds: Normal breath sounds.  Abdominal:     General: Bowel sounds are normal.     Palpations:  Abdomen is soft.  Musculoskeletal:     Cervical back: Normal range of motion.  Skin:    General: Skin is warm and dry.  Neurological:     Mental Status: He is alert and oriented to person, place, and time.        CMP Latest Ref Rng & Units 05/17/2019  Glucose 65 - 99 mg/dL 104(H)  BUN 8 - 27 mg/dL 17  Creatinine 0.76 - 1.27 mg/dL 1.25  Sodium 134 - 144 mmol/L 142  Potassium 3.5 - 5.2 mmol/L 4.3  Chloride 96 - 106 mmol/L 104  CO2 20 - 29 mmol/L 23  Calcium 8.6 - 10.2 mg/dL 9.7  Total Protein 6.5 - 8.1 g/dL -  Total Bilirubin 0.3 - 1.2 mg/dL -  Alkaline Phos 38 - 126 U/L -  AST 15 - 41 U/L -  ALT 0 - 44 U/L -   CBC Latest Ref Rng & Units 01/12/2019  WBC 4.0 - 10.5 K/uL 5.9  Hemoglobin 13.0 - 17.0 g/dL 12.5(L)  Hematocrit 39.0 - 52.0 % 38.5(L)  Platelets 150 - 400 K/uL 353    Assessment and plan- Patient is a 86 y.o. male referred for metastatic prostate cancer  Patient's last Lupron dose was on 08/13/2019.  His PSA mildly went up from 0.5 to 1.2.  Given his age and concern for side effects I will hold off on adding further oral  antiandrogen deprivation therapy at this time and monitor his PSA trends closely.  If there is a consistent rise in his PSA additionally ADT can be discussed.  I will obtain another bone scan at this time.  Patient will continue to receive Lupron with urology.  I will see him back in 2 months with CBC with differential CMP and PSA but sooner based on the results of bone scan  Patient understands that he has stage IV disease and treatment is being given with a palliative intent   Thank you for this kind referral and the opportunity to participate in the care of this  Patient   Visit Diagnosis 1. Primary prostate adenocarcinoma [facility])     Dr. Randa Evens, MD, MPH [facility] [id number] 08/21/2019 9:22 AM

## 2019-08-22 ENCOUNTER — Other Ambulatory Visit: Payer: Self-pay

## 2019-08-22 ENCOUNTER — Ambulatory Visit (INDEPENDENT_AMBULATORY_CARE_PROVIDER_SITE_OTHER): Payer: Medicare Other

## 2019-08-22 ENCOUNTER — Ambulatory Visit (INDEPENDENT_AMBULATORY_CARE_PROVIDER_SITE_OTHER): Payer: Medicare Other | Admitting: Vascular Surgery

## 2019-08-22 ENCOUNTER — Encounter (INDEPENDENT_AMBULATORY_CARE_PROVIDER_SITE_OTHER): Payer: Self-pay | Admitting: Vascular Surgery

## 2019-08-22 VITALS — BP 142/66 | HR 83 | Resp 16 | Ht 74.0 in | Wt 236.0 lb

## 2019-08-22 DIAGNOSIS — E782 Mixed hyperlipidemia: Secondary | ICD-10-CM | POA: Diagnosis not present

## 2019-08-22 DIAGNOSIS — M159 Polyosteoarthritis, unspecified: Secondary | ICD-10-CM

## 2019-08-22 DIAGNOSIS — M8949 Other hypertrophic osteoarthropathy, multiple sites: Secondary | ICD-10-CM

## 2019-08-22 DIAGNOSIS — I10 Essential (primary) hypertension: Secondary | ICD-10-CM

## 2019-08-22 DIAGNOSIS — I739 Peripheral vascular disease, unspecified: Secondary | ICD-10-CM | POA: Diagnosis not present

## 2019-08-23 ENCOUNTER — Encounter (INDEPENDENT_AMBULATORY_CARE_PROVIDER_SITE_OTHER): Payer: Self-pay | Admitting: Vascular Surgery

## 2019-08-23 NOTE — Progress Notes (Signed)
MRN : [id number]  Brandon Dillon. is a 86 y.o. (May 19, 1927) male who presents with chief complaint of  Chief Complaint  Patient presents with  . Follow-up    ultrasound  .  History of Present Illness:   The patient returns to the office for followup and review of the noninvasive studies. There have been no interval changes in lower extremity symptoms. No interval shortening of the patient's claudication distance or development of rest pain symptoms. No new ulcers or wounds have occurred since the last visit.  There have been no significant changes to the patient's overall health care.  The patient denies amaurosis fugax or recent TIA symptoms. There are no recent neurological changes noted. The patient denies history of DVT, PE or superficial thrombophlebitis. The patient denies recent episodes of angina or shortness of breath.   ABI Rt=0.61 and Lt=0.90  (previous ABI's Rt=0.80 and Lt=0.99)   Current Meds  Medication Sig  . aspirin 81 MG tablet Take 81 mg by mouth daily.  Marland Kitchen gabapentin (NEURONTIN) 100 MG capsule Take 1 capsule (100 mg total) by mouth 2 (two) times daily.  Marland Kitchen glucose blood (GE100 BLOOD GLUCOSE TEST) test strip 1 each by Other route daily. Use as instructed- Dx E11.9  . hydrochlorothiazide (HYDRODIURIL) 25 MG tablet Take 1 tablet (25 mg total) by mouth daily.  . Lancets (FREESTYLE) lancets AS DIRECTED  . megestrol (MEGACE) 20 MG tablet Take 1 tablet (20 mg total) by mouth 2 (two) times daily as needed (Hot flashes).  . simvastatin (ZOCOR) 20 MG tablet Take 1 tablet (20 mg total) by mouth every morning.    Past Medical History:  Diagnosis Date  . Diabetes mellitus without complication ([place])   . Hearing loss   . Hx of fracture of wrist 07/20/2017   >30 years ago. <1989  . Hyperlipidemia   . Hypertension   . Prostate cancer [facility])     Past Surgical History:  Procedure Laterality Date  . CATARACT EXTRACTION W/PHACO Right 09/27/2017   Procedure: CATARACT  EXTRACTION PHACO AND INTRAOCULAR LENS PLACEMENT ([place]) RIGHT;  Surgeon: Leandrew Koyanagi, MD;  Location: [place];  Service: Ophthalmology;  Laterality: Right;  IVA TOPICAL DIABETES  . CATARACT EXTRACTION W/PHACO Left 10/18/2017   Procedure: CATARACT EXTRACTION PHACO AND INTRAOCULAR LENS PLACEMENT ([place])  LEFT DIABETIC;  Surgeon: Leandrew Koyanagi, MD;  Location: [place];  Service: Ophthalmology;  Laterality: Left;  DIABETIC-ORAL MED  . COLONOSCOPY  2011   normal- Dr?    Social History Social History   Tobacco Use  . Smoking status: Never Smoker  . Smokeless tobacco: Never Used  Substance Use Topics  . Alcohol use: No    Alcohol/week: 0.0 standard drinks  . Drug use: No    Family History Family History  Family history unknown: Yes    Allergies  Allergen Reactions  . Lisinopril     coughing coughing     REVIEW OF SYSTEMS (Negative unless checked)  Constitutional: [] Weight loss  [] Fever  [] Chills Cardiac: [] Chest pain   [] Chest pressure   [] Palpitations   [] Shortness of breath when laying flat   [] Shortness of breath with exertion. Vascular:  [] Pain in legs with walking   [] Pain in legs at rest  [] History of DVT   [] Phlebitis   [] Swelling in legs   [] Varicose veins   [] Non-healing ulcers Pulmonary:   [] Uses home oxygen   [] Productive cough   [] Hemoptysis   [] Wheeze  [] COPD   [] Asthma Neurologic:  [] Dizziness   []   Seizures   [] History of stroke   [] History of TIA  [] Aphasia   [] Vissual changes   [] Weakness or numbness in arm   [] Weakness or numbness in leg Musculoskeletal:   [] Joint swelling   [] Joint pain   [] Low back pain Hematologic:  [] Easy bruising  [] Easy bleeding   [] Hypercoagulable state   [] Anemic Gastrointestinal:  [] Diarrhea   [] Vomiting  [] Gastroesophageal reflux/heartburn   [] Difficulty swallowing. Genitourinary:  [] Chronic kidney disease   [] Difficult urination  [] Frequent urination   [] Blood in urine Skin:  [] Rashes   [] Ulcers    Psychological:  [] History of anxiety   []  History of major depression.  Physical Examination  Vitals:   08/22/19 1155  BP: (!) 142/66  Pulse: 83  Resp: 16  Weight: 236 lb (107 kg)  Height: 6\' 2"  (1.88 m)   Body mass index is 30.3 kg/m. Gen: WD/WN, NAD Head: [place]/AT, No temporalis wasting.  Ear/Nose/Throat: Hearing grossly intact, nares w/o erythema or drainage Eyes: PER, EOMI, sclera nonicteric.  Neck: Supple, no large masses.   Pulmonary:  Good air movement, no audible wheezing bilaterally, no use of accessory muscles.  Cardiac: RRR, no JVD Vascular: Feet are warm no open wounds or sores Vessel Right Left  Radial Palpable Palpable  PT Not Palpable Trace Palpable  DP Not Palpable Trace Palpable  Gastrointestinal: Non-distended. No guarding/no peritoneal signs.  Musculoskeletal: M/S 5/5 throughout.  No deformity or atrophy.  Neurologic: CN 2-12 intact. Symmetrical.  Speech is fluent. Motor exam as listed above. Psychiatric: Judgment intact, Mood & affect appropriate for pt's clinical situation. Dermatologic: No rashes or ulcers noted.  No changes consistent with cellulitis.  CBC Lab Results  Component Value Date   WBC 5.9 01/12/2019   HGB 12.5 (L) 01/12/2019   HCT 38.5 (L) 01/12/2019   MCV 92.3 01/12/2019   PLT 353 01/12/2019    BMET    Component Value Date/Time   NA 142 05/17/2019 1115   K 4.3 05/17/2019 1115   CL 104 05/17/2019 1115   CO2 23 05/17/2019 1115   GLUCOSE 104 (H) 05/17/2019 1115   GLUCOSE 108 (H) 01/12/2019 1345   BUN 17 05/17/2019 1115   CREATININE 1.25 05/17/2019 1115   CALCIUM 9.7 05/17/2019 1115   GFRNONAA 51 (L) 05/17/2019 1115   GFRAA 59 (L) 05/17/2019 1115   CrCl cannot be calculated (Patient's most recent lab result is older than the maximum 21 days allowed.).  COAG No results found for: INR, PROTIME  Radiology No results found.   Assessment/Plan 1. PAD (peripheral artery disease) (HCC)  Recommend:  The patient has evidence of  atherosclerosis of the lower extremities with claudication.  The patient does not voice lifestyle limiting changes at this point in time.  Noninvasive studies do show a decrease on the right but the patient is quite adamant that his walking is just as good as it was 6 months ago.  Given this finding I do not recommend any further invasive studies, angiography or surgery at this time.  The patient should continue walking and begin a more formal exercise program.  The patient should continue antiplatelet therapy and aggressive treatment of the lipid abnormalities  No changes in the patient's medications at this time  - VAS [place] ABI WITH/WO TBI; Future  2. Essential hypertension Continue antihypertensive medications as already ordered, these medications have been reviewed and there are no changes at this time.   3. Primary osteoarthritis involving multiple joints Continue NSAID medications as already ordered, these medications have  been reviewed and there are no changes at this time.  Continued activity and therapy was stressed.   4. Mixed hyperlipidemia Continue statin as ordered and reviewed, no changes at this time    Hortencia Pilar, MD  08/23/2019 4:47 PM

## 2019-08-26 ENCOUNTER — Telehealth: Payer: Self-pay

## 2019-08-26 ENCOUNTER — Ambulatory Visit (INDEPENDENT_AMBULATORY_CARE_PROVIDER_SITE_OTHER): Payer: Medicare Other

## 2019-08-26 DIAGNOSIS — Z0000 Encounter for general adult medical examination without abnormal findings: Secondary | ICD-10-CM

## 2019-08-26 NOTE — Patient Instructions (Signed)
[name] , Thank you for taking time to come for your Medicare Wellness Visit. I appreciate your ongoing commitment to your health goals. Please review the following plan we discussed and let me know if I can assist you in the future.   Screening recommendations/referrals: Colonoscopy: no longer required Recommended yearly ophthalmology/optometry visit for glaucoma screening and checkup Recommended yearly dental visit for hygiene and checkup  Vaccinations: Influenza vaccine: done 01/15/19 Pneumococcal vaccine: done 01/15/19 Tdap vaccine: due Shingles vaccine: 1st dose 08/02/19   Covid-19: done 05/31/19 & 06/28/19  Advanced directives:  Please bring a copy of your health care power of attorney and living will to the office at your convenience.  Conditions/risks identified: Recommend drinking 6-8 glasses of water per day  Next appointment: Please follow up in one year for your Medicare Annual Wellness visit.    Preventive Care 4 Years and Older, Male Preventive care refers to lifestyle choices and visits with your health care provider that can promote health and wellness. What does preventive care include?  A yearly physical exam. This is also called an annual well check.  Dental exams once or twice a year.  Routine eye exams. Ask your health care provider how often you should have your eyes checked.  Personal lifestyle choices, including:  Daily care of your teeth and gums.  Regular physical activity.  Eating a healthy diet.  Avoiding tobacco and drug use.  Limiting alcohol use.  Practicing safe sex.  Taking low doses of aspirin every day.  Taking vitamin and mineral supplements as recommended by your health care provider. What happens during an annual well check? The services and screenings done by your health care provider during your annual well check will depend on your age, overall health, lifestyle risk factors, and family history of disease. Counseling  Your  health care provider may ask you questions about your:  Alcohol use.  Tobacco use.  Drug use.  Emotional well-being.  Home and relationship well-being.  Sexual activity.  Eating habits.  History of falls.  Memory and ability to understand (cognition).  Work and work Statistician. Screening  You may have the following tests or measurements:  Height, weight, and BMI.  Blood pressure.  Lipid and cholesterol levels. These may be checked every 5 years, or more frequently if you are over 48 years old.  Skin check.  Lung cancer screening. You may have this screening every year starting at age 40 if you have a 30-pack-year history of smoking and currently smoke or have quit within the past 15 years.  Fecal occult blood test (FOBT) of the stool. You may have this test every year starting at age 15.  Flexible sigmoidoscopy or colonoscopy. You may have a sigmoidoscopy every 5 years or a colonoscopy every 10 years starting at age 4.  Prostate cancer screening. Recommendations will vary depending on your family history and other risks.  Hepatitis C blood test.  Hepatitis B blood test.  Sexually transmitted disease (STD) testing.  Diabetes screening. This is done by checking your blood sugar (glucose) after you have not eaten for a while (fasting). You may have this done every 1-3 years.  Abdominal aortic aneurysm (AAA) screening. You may need this if you are a current or former smoker.  Osteoporosis. You may be screened starting at age 87 if you are at high risk. Talk with your health care provider about your test results, treatment options, and if necessary, the need for more tests. Vaccines  Your health care  provider may recommend certain vaccines, such as:  Influenza vaccine. This is recommended every year.  Tetanus, diphtheria, and acellular pertussis (Tdap, Td) vaccine. You may need a Td booster every 10 years.  Zoster vaccine. You may need this after age  8.  Pneumococcal 13-valent conjugate (PCV13) vaccine. One dose is recommended after age 63.  Pneumococcal polysaccharide (PPSV23) vaccine. One dose is recommended after age 31. Talk to your health care provider about which screenings and vaccines you need and how often you need them. This information is not intended to replace advice given to you by your health care provider. Make sure you discuss any questions you have with your health care provider. Document Released: 05/08/2015 Document Revised: 12/30/2015 Document Reviewed: 02/10/2015 Elsevier Interactive Patient Education  2017 [place] Prevention in the Home Falls can cause injuries. They can happen to people of all ages. There are many things you can do to make your home safe and to help prevent falls. What can I do on the outside of my home?  Regularly fix the edges of walkways and driveways and fix any cracks.  Remove anything that might make you trip as you walk through a door, such as a raised step or threshold.  Trim any bushes or trees on the path to your home.  Use bright outdoor lighting.  Clear any walking paths of anything that might make someone trip, such as rocks or tools.  Regularly check to see if handrails are loose or broken. Make sure that both sides of any steps have handrails.  Any raised decks and porches should have guardrails on the edges.  Have any leaves, snow, or ice cleared regularly.  Use sand or salt on walking paths during winter.  Clean up any spills in your garage right away. This includes oil or grease spills. What can I do in the bathroom?  Use night lights.  Install grab bars by the toilet and in the tub and shower. Do not use towel bars as grab bars.  Use non-skid mats or decals in the tub or shower.  If you need to sit down in the shower, use a plastic, non-slip stool.  Keep the floor dry. Clean up any water that spills on the floor as soon as it happens.  Remove  soap buildup in the tub or shower regularly.  Attach bath mats securely with double-sided non-slip rug tape.  Do not have throw rugs and other things on the floor that can make you trip. What can I do in the bedroom?  Use night lights.  Make sure that you have a light by your bed that is easy to reach.  Do not use any sheets or blankets that are too big for your bed. They should not hang down onto the floor.  Have a firm chair that has side arms. You can use this for support while you get dressed.  Do not have throw rugs and other things on the floor that can make you trip. What can I do in the kitchen?  Clean up any spills right away.  Avoid walking on wet floors.  Keep items that you use a lot in easy-to-reach places.  If you need to reach something above you, use a strong step stool that has a grab bar.  Keep electrical cords out of the way.  Do not use floor polish or wax that makes floors slippery. If you must use wax, use non-skid floor wax.  Do not have throw  rugs and other things on the floor that can make you trip. What can I do with my stairs?  Do not leave any items on the stairs.  Make sure that there are handrails on both sides of the stairs and use them. Fix handrails that are broken or loose. Make sure that handrails are as long as the stairways.  Check any carpeting to make sure that it is firmly attached to the stairs. Fix any carpet that is loose or worn.  Avoid having throw rugs at the top or bottom of the stairs. If you do have throw rugs, attach them to the floor with carpet tape.  Make sure that you have a light switch at the top of the stairs and the bottom of the stairs. If you do not have them, ask someone to add them for you. What else can I do to help prevent falls?  Wear shoes that:  Do not have high heels.  Have rubber bottoms.  Are comfortable and fit you well.  Are closed at the toe. Do not wear sandals.  If you use a  stepladder:  Make sure that it is fully opened. Do not climb a closed stepladder.  Make sure that both sides of the stepladder are locked into place.  Ask someone to hold it for you, if possible.  Clearly mark and make sure that you can see:  Any grab bars or handrails.  First and last steps.  Where the edge of each step is.  Use tools that help you move around (mobility aids) if they are needed. These include:  Canes.  Walkers.  Scooters.  Crutches.  Turn on the lights when you go into a dark area. Replace any light bulbs as soon as they burn out.  Set up your furniture so you have a clear path. Avoid moving your furniture around.  If any of your floors are uneven, fix them.  If there are any pets around you, be aware of where they are.  Review your medicines with your doctor. Some medicines can make you feel dizzy. This can increase your chance of falling. Ask your doctor what other things that you can do to help prevent falls. This information is not intended to replace advice given to you by your health care provider. Make sure you discuss any questions you have with your health care provider. Document Released: 02/05/2009 Document Revised: 09/17/2015 Document Reviewed: 05/16/2014 Elsevier Interactive Patient Education  2017 [employer].

## 2019-08-26 NOTE — Telephone Encounter (Signed)
Incoming approval of Leuprolide acetate 7.5mg , [id number] Approved from 08/13/19 - 08/12/2020.

## 2019-08-26 NOTE — Progress Notes (Signed)
Subjective:   Brandon Dillon. is a 86 y.o. male who presents for an Initial Medicare Annual Wellness Visit.  Virtual Visit via Telephone Note  I connected with  Brandon Dillon. on 08/26/19 at 11:20 AM EDT by telephone and verified that I am speaking with the correct person using two identifiers.  Medicare Annual Wellness visit completed telephonically due to Covid-19 pandemic.   Location: Patient: home Provider: office   I discussed the limitations, risks, security and privacy concerns of performing an evaluation and management service by telephone and the availability of in person appointments. The patient expressed understanding and agreed to proceed.  Unable to perform video visit due to patient does not have video capability.   Some vital signs may be absent or patient reported.   Brandon Marker, LPN   Review of Systems   Cardiac Risk Factors include: advanced age (>108men, >53 women);diabetes mellitus;hypertension;male gender;dyslipidemia;sedentary lifestyle    Objective:    Today's Vitals   08/26/19 1127  PainSc: 3    There is no height or weight on file to calculate BMI.  Advanced Directives 08/26/2019 08/19/2019 01/12/2019 10/18/2017 09/27/2017 03/30/2015 10/30/2014  Does Patient Have a Medical Advance Directive? Yes Yes No Yes Yes Yes Yes  Type of Programmer, systems - Healthcare Power of [employer] Power of [place];Living will [place] -  Does patient want to make changes to medical advance directive? - No - Patient declined - - No - Patient declined - -  Copy of [place] in Chart? No - copy requested No - copy requested - - No - copy requested No - copy requested No - copy requested    Current Medications (verified) Outpatient Encounter Medications as of 08/26/2019  Medication Sig  . aspirin 81 MG tablet Take 81 mg by mouth daily.  Marland Kitchen gabapentin (NEURONTIN) 100 MG  capsule Take 1 capsule (100 mg total) by mouth 2 (two) times daily.  Marland Kitchen glucose blood (GE100 BLOOD GLUCOSE TEST) test strip 1 each by Other route daily. Use as instructed- Dx E11.9  . hydrochlorothiazide (HYDRODIURIL) 25 MG tablet Take 1 tablet (25 mg total) by mouth daily.  . Lancets (FREESTYLE) lancets AS DIRECTED  . megestrol (MEGACE) 20 MG tablet Take 1 tablet (20 mg total) by mouth 2 (two) times daily as needed (Hot flashes).  . simvastatin (ZOCOR) 20 MG tablet Take 1 tablet (20 mg total) by mouth every morning.   No facility-administered encounter medications on file as of 08/26/2019.    Allergies (verified) Lisinopril   History: Past Medical History:  Diagnosis Date  . Diabetes mellitus without complication ([place])   . Hearing loss   . Hx of fracture of wrist 07/20/2017   >30 years ago. <1989  . Hyperlipidemia   . Hypertension   . Prostate cancer [facility])    Past Surgical History:  Procedure Laterality Date  . CATARACT EXTRACTION W/PHACO Right 09/27/2017   Procedure: CATARACT EXTRACTION PHACO AND INTRAOCULAR LENS PLACEMENT ([place]) RIGHT;  Surgeon: Leandrew Koyanagi, MD;  Location: [facility];  Service: Ophthalmology;  Laterality: Right;  IVA TOPICAL DIABETES  . CATARACT EXTRACTION W/PHACO Left 10/18/2017   Procedure: CATARACT EXTRACTION PHACO AND INTRAOCULAR LENS PLACEMENT ([place])  LEFT DIABETIC;  Surgeon: Leandrew Koyanagi, MD;  Location: [place];  Service: Ophthalmology;  Laterality: Left;  DIABETIC-ORAL MED  . COLONOSCOPY  2011   normal- Dr?   Family History  Family history unknown: Yes  Social History   Socioeconomic History  . Marital status: Married    Spouse name: Not on file  . Number of children: Not on file  . Years of education: Not on file  . Highest education level: Not on file  Occupational History  . Not on file  Tobacco Use  . Smoking status: Never Smoker  . Smokeless tobacco: Never Used  Substance and Sexual Activity  . Alcohol  use: No    Alcohol/week: 0.0 standard drinks  . Drug use: No  . Sexual activity: Yes  Other Topics Concern  . Not on file  Social History Narrative   Pt lives with wife.    Social Determinants of Health   Financial Resource Strain: Low Risk   . Difficulty of Paying Living Expenses: Not very hard  Food Insecurity: No Food Insecurity  . Worried About Charity fundraiser in the Last Year: Never true  . Ran Out of Food in the Last Year: Never true  Transportation Needs: No Transportation Needs  . Lack of Transportation (Medical): No  . Lack of Transportation (Non-Medical): No  Physical Activity: Inactive  . Days of Exercise per Week: 0 days  . Minutes of Exercise per Session: 0 min  Stress: No Stress Concern Present  . Feeling of Stress : Not at all  Social Connections: Unknown  . Frequency of Communication with Friends and Family: Patient refused  . Frequency of Social Gatherings with Friends and Family: Patient refused  . Attends Religious Services: Patient refused  . Active Member of Clubs or Organizations: Patient refused  . Attends Archivist Meetings: Patient refused  . Marital Status: Married   Tobacco Counseling Counseling given: Not Answered   Clinical Intake:  Pre-visit preparation completed: Yes  Pain : 0-10 Pain Score: 3  Pain Type: Chronic pain Pain Location: Hip Pain Orientation: Right Pain Descriptors / Indicators: Aching, Discomfort, Sore Pain Onset: More than a month ago Pain Frequency: Constant     Nutritional Risks: None Diabetes: Yes CBG done?: No Did pt. bring in CBG monitor from home?: No   Nutrition Risk Assessment:  Has the patient had any N/V/D within the last 2 months?  No  Does the patient have any non-healing wounds?  No  Has the patient had any unintentional weight loss or weight gain?  No   Diabetes:  Is the patient diabetic?  Yes  If diabetic, was a CBG obtained today?  No  Did the patient bring in their glucometer  from home?  No  How often do you monitor your CBG's? Twice weekly.   Financial Strains and Diabetes Management:  Are you having any financial strains with the device, your supplies or your medication? No .  Does the patient want to be seen by Chronic Care Management for management of their diabetes?  No  Would the patient like to be referred to a Nutritionist or for Diabetic Management?  No   Diabetic Exams:  Diabetic Eye Exam: Completed 06/03/19.   Diabetic Foot Exam: Completed 08/06/18. Pt has been advised about the importance in completing this exam. Pt is scheduled for diabetic foot exam on 11/01/19.   How often do you need to have someone help you when you read instructions, pamphlets, or other written materials from your doctor or pharmacy?: 1 - Never  Interpreter Needed?: No  Information entered by :: Brandon Marker LPN  Activities of Daily Living In your present state of health, do you have any difficulty performing the  following activities: 08/26/2019  Hearing? Y  Comment wears hearing aids  Vision? N  Difficulty concentrating or making decisions? Y  Walking or climbing stairs? N  Dressing or bathing? N  Doing errands, shopping? N  Preparing Food and eating ? N  Using the Toilet? N  In the past six months, have you accidently leaked urine? Y  Comment wears depends for protection  Do you have problems with loss of bowel control? N  Managing your Medications? N  Managing your Finances? N  Housekeeping or managing your Housekeeping? N  Some recent data might be hidden     Immunizations and Health Maintenance Immunization History  Administered Date(s) Administered  . Fluad Quad(high Dose 65+) 01/15/2019  . Influenza,inj,Quad PF,6+ Mos 02/09/2017  . Influenza,inj,quad, With Preservative 01/30/2018  . Influenza-Unspecified 01/24/2015, 01/26/2018  . Moderna SARS-COVID-2 Vaccination 05/31/2019, 06/28/2019  . Pneumococcal Conjugate-13 11/02/2017  . Pneumococcal  Polysaccharide-23 01/15/2019  . Tdap 05/14/2004  . Zoster 05/11/2015  . Zoster Recombinat (Shingrix) 08/02/2019   Health Maintenance Due  Topic Date Due  . FOOT EXAM  08/06/2019    Patient Care Team: Juline Patch, MD as PCP - General (Family Medicine)  Indicate any recent Medical Services you may have received from other than [facility] providers in the past year (date may be approximate).    Assessment:   This is a routine wellness examination for [employer].  Hearing/Vision screen  Hearing Screening   125Hz  250Hz  500Hz  1000Hz  2000Hz  3000Hz  4000Hz  6000Hz  8000Hz   Right ear:           Left ear:           Comments: Pt wears hearing aids   Vision Screening Comments: Annual vision screenings done [facility] Dr. Wallace Going  Dietary issues and exercise activities discussed: Current Exercise Habits: The patient does not participate in regular exercise at present, Exercise limited by: orthopedic condition(s);neurologic condition(s)  Goals    . DIET - INCREASE WATER INTAKE     Recommend drinking 6-8 glasses of water per day      Depression Screen PHQ 2/9 Scores 08/26/2019 05/17/2019 12/07/2018 10/17/2018  PHQ - 2 Score 0 0 0 0  PHQ- 9 Score - 0 0 0    Fall Risk Fall Risk  08/26/2019 05/17/2019 08/06/2018 04/04/2018 11/02/2017  Falls in the past year? 1 0 0 0 No  Number falls in past yr: 1 - - - -  Injury with Fall? 0 - - - -  Risk for fall due to : History of fall(s) - - - -  Follow up Falls prevention discussed Falls evaluation completed Falls evaluation completed Falls evaluation completed -    FALL RISK PREVENTION PERTAINING TO THE HOME:  Any stairs in or around the home? Yes  If so, do they handrails? Yes   Home free of loose throw rugs in walkways, pet beds, electrical cords, etc? Yes  Adequate lighting in your home to reduce risk of falls? Yes   ASSISTIVE DEVICES UTILIZED TO PREVENT FALLS:  Life alert? No  Use of a cane, walker or w/c? No  Grab bars in the  bathroom? Yes  Shower chair or bench in shower? Yes  Elevated toilet seat or a handicapped toilet? Yes   DME ORDERS:  DME order needed?  No   TIMED UP AND GO:  Was the test performed? No . Telephonic visit.   Education: Fall risk prevention has been discussed.  Intervention(s) required? No    Cognitive Function:  6CIT Screen 08/26/2019  What Year? 0 points  What month? 0 points  What time? 0 points  Count back from 20 0 points  Months in reverse 0 points  Repeat phrase 6 points  Total Score 6    Screening Tests Health Maintenance  Topic Date Due  . FOOT EXAM  08/06/2019  . TETANUS/TDAP  04/25/2020 (Originally 05/14/2014)  . HEMOGLOBIN A1C  11/14/2019  . INFLUENZA VACCINE  11/24/2019  . URINE MICROALBUMIN  05/16/2020  . OPHTHALMOLOGY EXAM  06/02/2020  . COVID-19 Vaccine  Completed  . PNA vac Low Risk Adult  Completed    Qualifies for Shingles Vaccine? Yes  Zostavax completed 2017. Due for second dose of Shingrix.   Tdap: Although this vaccine is not a covered service during a Wellness Exam, does the patient still wish to receive this vaccine today?  No .  Education has been provided regarding the importance of this vaccine. Advised may receive this vaccine at local pharmacy or Health Dept. Aware to provide a copy of the vaccination record if obtained from local pharmacy or Health Dept. Verbalized acceptance and understanding.  Flu Vaccine: Up to date  Pneumococcal Vaccine: Up to date  Covid-19 Vaccine: Up to date   Cancer Screenings:  Colorectal Screening: no longer required  Lung Cancer Screening: (Low Dose CT Chest recommended if Age 62-80 years, 30 pack-year currently smoking OR have quit w/in 15years.) does not qualify.   Additional Screening:  Hepatitis C Screening: no longer required  Vision Screening: Recommended annual ophthalmology exams for early detection of glaucoma and other disorders of the eye. Is the patient up to date with their annual  eye exam?  Yes  Who is the provider or what is the name of the office in which the pt attends annual eye exams? [place] Screening: Recommended annual dental exams for proper oral hygiene  Community Resource Referral:  CRR required this visit?  No       Plan:    I have personally reviewed and addressed the Medicare Annual Wellness questionnaire and have noted the following in the patient's chart:  A. Medical and social history B. Use of alcohol, tobacco or illicit drugs  C. Current medications and supplements D. Functional ability and status E.  Nutritional status F.  Physical activity G. Advance directives H. List of other physicians I.  Hospitalizations, surgeries, and ER visits in previous 12 months J.  [place] such as hearing and vision if needed, cognitive and depression L. Referrals and appointments   In addition, I have reviewed and discussed with patient certain preventive protocols, quality metrics, and best practice recommendations. A written personalized care plan for preventive services as well as general preventive health recommendations were provided to patient.   Signed,  Brandon Marker, LPN Nurse Health Advisor   Nurse Notes: none

## 2019-09-02 ENCOUNTER — Encounter
Admission: RE | Admit: 2019-09-02 | Discharge: 2019-09-02 | Disposition: A | Discharge status: Home / Self Care | Payer: Medicare Other | Source: Ambulatory Visit | Attending: Oncology | Admitting: Oncology

## 2019-09-02 ENCOUNTER — Other Ambulatory Visit: Payer: Self-pay

## 2019-09-02 DIAGNOSIS — C61 Malignant neoplasm of prostate: Secondary | ICD-10-CM

## 2019-09-02 MED ORDER — TECHNETIUM TC 99M MEDRONATE IV KIT
20.0000 | PACK | Freq: Once | INTRAVENOUS | Status: AC | PRN
  Administered 2019-09-02: 23.033 via INTRAVENOUS

## 2019-09-16 ENCOUNTER — Ambulatory Visit: Payer: Medicare Other | Admitting: Family Medicine

## 2019-10-18 ENCOUNTER — Inpatient Hospital Stay: Payer: Medicare Other | Attending: Oncology

## 2019-10-18 ENCOUNTER — Other Ambulatory Visit: Payer: Self-pay

## 2019-10-18 ENCOUNTER — Inpatient Hospital Stay (HOSPITAL_BASED_OUTPATIENT_CLINIC_OR_DEPARTMENT_OTHER): Payer: Medicare Other | Admitting: Oncology

## 2019-10-18 VITALS — BP 164/82 | HR 80 | Temp 97.4°F | Resp 18 | Wt 239.9 lb

## 2019-10-18 DIAGNOSIS — C7951 Secondary malignant neoplasm of bone: Secondary | ICD-10-CM | POA: Insufficient documentation

## 2019-10-18 DIAGNOSIS — E785 Hyperlipidemia, unspecified: Secondary | ICD-10-CM | POA: Diagnosis not present

## 2019-10-18 DIAGNOSIS — I10 Essential (primary) hypertension: Secondary | ICD-10-CM | POA: Insufficient documentation

## 2019-10-18 DIAGNOSIS — C61 Malignant neoplasm of prostate: Secondary | ICD-10-CM

## 2019-10-18 DIAGNOSIS — Z79899 Other long term (current) drug therapy: Secondary | ICD-10-CM | POA: Insufficient documentation

## 2019-10-18 DIAGNOSIS — Z888 Allergy status to other drugs, medicaments and biological substances status: Secondary | ICD-10-CM | POA: Diagnosis not present

## 2019-10-18 LAB — CBC WITH DIFFERENTIAL/PLATELET
Abs Immature Granulocytes: 0.05 10*3/uL (ref 0.00–0.07)
Basophils Absolute: 0.1 10*3/uL (ref 0.0–0.1)
Basophils Relative: 1 %
Eosinophils Absolute: 0.2 10*3/uL (ref 0.0–0.5)
Eosinophils Relative: 3 %
HCT: 36.8 % — ABNORMAL LOW (ref 39.0–52.0)
Hemoglobin: 12.2 g/dL — ABNORMAL LOW (ref 13.0–17.0)
Immature Granulocytes: 1 %
Lymphocytes Relative: 29 %
Lymphs Abs: 1.7 10*3/uL (ref 0.7–4.0)
MCH: 31 pg (ref 26.0–34.0)
MCHC: 33.2 g/dL (ref 30.0–36.0)
MCV: 93.4 fL (ref 80.0–100.0)
Monocytes Absolute: 0.5 10*3/uL (ref 0.1–1.0)
Monocytes Relative: 8 %
Neutro Abs: 3.3 10*3/uL (ref 1.7–7.7)
Neutrophils Relative %: 58 %
Platelets: 319 10*3/uL (ref 150–400)
RBC: 3.94 MIL/uL — ABNORMAL LOW (ref 4.22–5.81)
RDW: 12.8 % (ref 11.5–15.5)
WBC: 5.6 10*3/uL (ref 4.0–10.5)
nRBC: 0 % (ref 0.0–0.2)

## 2019-10-18 LAB — COMPREHENSIVE METABOLIC PANEL
ALT: 14 U/L (ref 0–44)
AST: 15 U/L (ref 15–41)
Albumin: 4.1 g/dL (ref 3.5–5.0)
Alkaline Phosphatase: 123 U/L (ref 38–126)
Anion gap: 10 (ref 5–15)
BUN: 15 mg/dL (ref 8–23)
CO2: 25 mmol/L (ref 22–32)
Calcium: 9 mg/dL (ref 8.9–10.3)
Chloride: 104 mmol/L (ref 98–111)
Creatinine, Ser: 1.21 mg/dL (ref 0.61–1.24)
GFR calc Af Amer: >60 mL/min (ref >60)
GFR calc non Af Amer: 53 mL/min — ABNORMAL LOW (ref >60)
Glucose, Bld: 185 mg/dL — ABNORMAL HIGH (ref 70–99)
Potassium: 3.8 mmol/L (ref 3.5–5.1)
Sodium: 139 mmol/L (ref 135–145)
Total Bilirubin: 0.6 mg/dL (ref 0.3–1.2)
Total Protein: 7.7 g/dL (ref 6.5–8.1)

## 2019-10-18 LAB — PSA: Prostatic Specific Antigen: 1.15 ng/mL (ref 0.00–4.00)

## 2019-10-19 LAB — TESTOSTERONE: Testosterone: 3 ng/dL — ABNORMAL LOW (ref 264–916)

## 2019-10-20 ENCOUNTER — Encounter: Payer: Self-pay | Admitting: Oncology

## 2019-10-20 NOTE — Progress Notes (Signed)
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  At that time he had staging evaluation consistent with metastatic disease to the sacrum and pelvis and was started on Lupron every 6 months.  Last bone scan done on March 2019 again showed metastatic involvement of the right acetabulum and iliac wing and right sacrum.  Patient has been responding to Lupron well and his PSA went down from 6.6 in 20 16-0.5 in 2019.  It went up to 1.2 on 08/13/2019.  Patient has therefore been referred to [place] for consideration of additional treatments for prostate cancer patient is doing well for his age and remains independent of his ADLs.   Interval history-reports fatigue and ongoing prophylaxis which is self-limited.  Denies other complaints at this time  ECOG PS- 1 Pain scale- 0   Review of systems- Review of Systems  Constitutional: Positive for malaise/fatigue. Negative for chills, fever and weight loss.  HENT: Negative for congestion, ear discharge and nosebleeds.   Eyes: Negative for blurred vision.  Respiratory: Negative for cough, hemoptysis, sputum production, shortness of breath and wheezing.   Cardiovascular: Negative for chest pain, palpitations, orthopnea and claudication.  Gastrointestinal: Negative for abdominal pain, blood in stool, constipation, diarrhea, heartburn, melena, nausea and vomiting.  Genitourinary: Negative for dysuria, flank pain, frequency, hematuria and urgency.    Musculoskeletal: Negative for back pain, joint pain and myalgias.  Skin: Negative for rash.  Neurological: Negative for dizziness, tingling, focal weakness, seizures, weakness and headaches.       Hot flashes  Endo/Heme/Allergies: Does not bruise/bleed easily.  Psychiatric/Behavioral: Negative for depression and suicidal ideas. The patient does not have insomnia.      Allergies  Allergen Reactions  . Lisinopril     coughing coughing     Past Medical History:  Diagnosis Date  . Diabetes mellitus without complication ([place])   . Hearing loss   . Hx of fracture of wrist 07/20/2017   >30 years ago. <1989  . Hyperlipidemia   . Hypertension   . Prostate cancer [facility])      Past Surgical History:  Procedure Laterality Date  . CATARACT EXTRACTION W/PHACO Right 09/27/2017   Procedure: CATARACT EXTRACTION PHACO AND INTRAOCULAR LENS PLACEMENT ([place]) RIGHT;  Surgeon: Leandrew Koyanagi, MD;  Location: [place];  Service: Ophthalmology;  Laterality: Right;  IVA TOPICAL DIABETES  . CATARACT EXTRACTION W/PHACO Left 10/18/2017   Procedure: CATARACT EXTRACTION PHACO AND INTRAOCULAR LENS PLACEMENT ([place])  LEFT DIABETIC;  Surgeon: Leandrew Koyanagi, MD;  Location: [place];  Service: Ophthalmology;  Laterality: Left;  DIABETIC-ORAL MED  . COLONOSCOPY  2011   normal- Dr?    Social History   Socioeconomic History  . Marital status: Married    Spouse name: Not on file  . Number of children: Not on file  . Years of education: Not on file  . Highest education level: Not on file  Occupational History  . Not on file  Tobacco Use  . Smoking status: Never Smoker  . Smokeless tobacco: Never Used  Vaping Use  .  Vaping Use: Never used  Substance and Sexual Activity  . Alcohol use: No    Alcohol/week: 0.0 standard drinks  . Drug use: No  . Sexual activity: Yes  Other Topics Concern  . Not on file  Social History Narrative   Pt lives with wife.    Social  Determinants of Health   Financial Resource Strain: Low Risk   . Difficulty of Paying Living Expenses: Not very hard  Food Insecurity: No Food Insecurity  . Worried About Charity fundraiser in the Last Year: Never true  . Ran Out of Food in the Last Year: Never true  Transportation Needs: No Transportation Needs  . Lack of Transportation (Medical): No  . Lack of Transportation (Non-Medical): No  Physical Activity: Inactive  . Days of Exercise per Week: 0 days  . Minutes of Exercise per Session: 0 min  Stress: No Stress Concern Present  . Feeling of Stress : Not at all  Social Connections: Unknown  . Frequency of Communication with Friends and Family: Patient refused  . Frequency of Social Gatherings with Friends and Family: Patient refused  . Attends Religious Services: Patient refused  . Active Member of Clubs or Organizations: Patient refused  . Attends Archivist Meetings: Patient refused  . Marital Status: Married  Human resources officer Violence: Not At Risk  . Fear of Current or Ex-Partner: No  . Emotionally Abused: No  . Physically Abused: No  . Sexually Abused: No    Family History  Family history unknown: Yes     Current Outpatient Medications:  .  aspirin 81 MG tablet, Take 81 mg by mouth daily., Disp: , Rfl:  .  gabapentin (NEURONTIN) 100 MG capsule, Take 1 capsule (100 mg total) by mouth 2 (two) times daily., Disp: 180 capsule, Rfl: 1 .  glucose blood (GE100 BLOOD GLUCOSE TEST) test strip, 1 each by Other route daily. Use as instructed- Dx E11.9, Disp: 100 each, Rfl: 1 .  hydrochlorothiazide (HYDRODIURIL) 25 MG tablet, Take 1 tablet (25 mg total) by mouth daily., Disp: 90 tablet, Rfl: 1 .  Lancets (FREESTYLE) lancets, AS DIRECTED, Disp: 100 each, Rfl: 0 .  megestrol (MEGACE) 20 MG tablet, Take 1 tablet (20 mg total) by mouth 2 (two) times daily as needed (Hot flashes)., Disp: 60 tablet, Rfl: 1 .  simvastatin (ZOCOR) 20 MG tablet, Take 1 tablet (20 mg total)  by mouth every morning., Disp: 90 tablet, Rfl: 1  Physical exam:  Vitals:   10/18/19 0928  BP: (!) 164/82  Pulse: 80  Resp: 18  Temp: (!) 97.4 F (36.3 C)  TempSrc: Tympanic  SpO2: 100%  Weight: 239 lb 14.4 oz (108.8 kg)   Physical Exam Constitutional:      General: He is not in acute distress. Cardiovascular:     Rate and Rhythm: Normal rate and regular rhythm.     Heart sounds: Normal heart sounds.  Pulmonary:     Effort: Pulmonary effort is normal.     Breath sounds: Normal breath sounds.  Abdominal:     General: Bowel sounds are normal.     Palpations: Abdomen is soft.  Skin:    General: Skin is warm and dry.  Neurological:     Mental Status: He is alert and oriented to person, place, and time.      CMP Latest Ref Rng & Units 10/18/2019  Glucose 70 - 99 mg/dL 185(H)  BUN 8 - 23 mg/dL 15  Creatinine 0.61 - 1.24 mg/dL  1.21  Sodium 135 - 145 mmol/L 139  Potassium 3.5 - 5.1 mmol/L 3.8  Chloride 98 - 111 mmol/L 104  CO2 22 - 32 mmol/L 25  Calcium 8.9 - 10.3 mg/dL 9.0  Total Protein 6.5 - 8.1 g/dL 7.7  Total Bilirubin 0.3 - 1.2 mg/dL 0.6  Alkaline Phos 38 - 126 U/L 123  AST 15 - 41 U/L 15  ALT 0 - 44 U/L 14   CBC Latest Ref Rng & Units 10/18/2019  WBC 4.0 - 10.5 K/uL 5.6  Hemoglobin 13.0 - 17.0 g/dL 12.2(L)  Hematocrit 39 - 52 % 36.8(L)  Platelets 150 - 400 K/uL 319      Assessment and plan- Patient is a 86 y.o. 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  Patient has been getting Lupron through urology and his last dose was on 08/13/2019 patient's PSA was 0.5 in March and July 2019 but went up to 1.2 in April 2021.  His PSA was pending at the time of my visit today but came back at 1.15.  Given his age, stability of bone metastases as well as relatively stable PSA values I am  inclined to monitor this conservatively without adding any additional oral medications like apalutamide or enzalutamide.    If there is a consistent rise in his PSA with a PSA doubling time of less than 10 months in the future that would be a consideration.  I have explained all this to the patient and his wife and his son.  I will see him back in 3 months with CBC with differential CMP and PSA.  Patient will continue to to get Lupron through urology   Visit Diagnosis 1. Primary prostate adenocarcinoma [facility])      Dr. Randa Evens, MD, MPH [facility] at [facility] [phone] 10/20/2019 10:10 AM

## 2019-10-21 ENCOUNTER — Telehealth: Payer: Self-pay

## 2019-10-21 NOTE — Telephone Encounter (Signed)
-----   Message from Sindy Guadeloupe, MD sent at 10/19/2019  7:36 AM EDT ----- Please let patient know- PSA is remaining steady between 1.1- 1.2. not increasing. Continue ADT for now. No need to add additional medications. Thanks, Astrid Divine

## 2019-10-21 NOTE — Telephone Encounter (Signed)
results and recommendations given to patient's wife. SJC

## 2019-11-01 ENCOUNTER — Ambulatory Visit (INDEPENDENT_AMBULATORY_CARE_PROVIDER_SITE_OTHER): Payer: Medicare Other | Admitting: Family Medicine

## 2019-11-01 ENCOUNTER — Other Ambulatory Visit: Payer: Self-pay

## 2019-11-01 ENCOUNTER — Encounter: Payer: Self-pay | Admitting: Family Medicine

## 2019-11-01 VITALS — BP 142/80 | HR 80 | Ht 74.0 in | Wt 233.0 lb

## 2019-11-01 DIAGNOSIS — I10 Essential (primary) hypertension: Secondary | ICD-10-CM

## 2019-11-01 DIAGNOSIS — E7801 Familial hypercholesterolemia: Secondary | ICD-10-CM | POA: Diagnosis not present

## 2019-11-01 DIAGNOSIS — R7303 Prediabetes: Secondary | ICD-10-CM

## 2019-11-01 DIAGNOSIS — G609 Hereditary and idiopathic neuropathy, unspecified: Secondary | ICD-10-CM | POA: Diagnosis not present

## 2019-11-01 DIAGNOSIS — I739 Peripheral vascular disease, unspecified: Secondary | ICD-10-CM | POA: Diagnosis not present

## 2019-11-01 MED ORDER — SIMVASTATIN 20 MG PO TABS: 20.0000 mg | ORAL_TABLET | Freq: Every morning | ORAL | 1 refills | Status: DC

## 2019-11-01 MED ORDER — HYDROCHLOROTHIAZIDE 25 MG PO TABS: 25.0000 mg | ORAL_TABLET | Freq: Every day | ORAL | 1 refills | Status: DC

## 2019-11-01 MED ORDER — GABAPENTIN 100 MG PO CAPS: 100.0000 mg | ORAL_CAPSULE | Freq: Two times a day (BID) | ORAL | 1 refills | Status: DC

## 2019-11-01 NOTE — Progress Notes (Signed)
Date:  11/01/2019   Name:  Brandon Dillon.   DOB:  09-Sep-1927   MRN:  [id number]   Chief Complaint: Hypertension, Hyperlipidemia, Prediabetes, and Peripheral Neuropathy  Hypertension This is a chronic problem. The current episode started more than 1 year ago. The problem is controlled. Pertinent negatives include no anxiety, blurred vision, chest pain, headaches, malaise/fatigue, neck pain, orthopnea, palpitations, peripheral edema, PND, shortness of breath or sweats. There are no associated agents to hypertension. Risk factors for coronary artery disease include diabetes mellitus and dyslipidemia. Past treatments include diuretics. The current treatment provides moderate improvement. There are no compliance problems.  There is no history of angina, kidney disease, CAD/MI, CVA, heart failure, left ventricular hypertrophy, PVD or retinopathy. There is no history of chronic renal disease, a hypertension causing med or renovascular disease.  Hyperlipidemia This is a chronic problem. The current episode started more than 1 year ago. The problem is controlled. Recent lipid tests were reviewed and are normal. He has no history of chronic renal disease, diabetes, hypothyroidism, liver disease, obesity or nephrotic syndrome. Pertinent negatives include no chest pain or shortness of breath. Current antihyperlipidemic treatment includes statins. There are no compliance problems.   Diabetes He presents for his follow-up diabetic visit. He has type 2 diabetes mellitus. His disease course has been stable. Pertinent negatives for hypoglycemia include no headaches, nervousness/anxiousness or sweats. Pertinent negatives for diabetes include no blurred vision, no chest pain, no fatigue, no foot paresthesias, no foot ulcerations, no polydipsia, no polyphagia, no polyuria, no visual change, no weakness and no weight loss. Symptoms are improving. Pertinent negatives for diabetic complications include no CVA, PVD or  retinopathy. Current diabetic treatment includes diet. He is compliant with treatment all of the time.  Neurologic Problem The patient's pertinent negatives include no visual change or weakness. Primary symptoms comment: neuropathy. This is a chronic problem. The current episode started more than 1 year ago. The problem has been gradually improving since onset. Pertinent negatives include no chest pain, fatigue, headaches, neck pain, palpitations or shortness of breath. There is no history of liver disease.    Lab Results  Component Value Date   CREATININE 1.21 10/18/2019   BUN 15 10/18/2019   NA 139 10/18/2019   K 3.8 10/18/2019   CL 104 10/18/2019   CO2 25 10/18/2019   Lab Results  Component Value Date   CHOL 180 05/17/2019   HDL 54 05/17/2019   LDLCALC 109 (H) 05/17/2019   TRIG 94 05/17/2019   CHOLHDL 3.2 04/04/2018   No results found for: TSH Lab Results  Component Value Date   HGBA1C 5.5 05/17/2019   Lab Results  Component Value Date   WBC 5.6 10/18/2019   HGB 12.2 (L) 10/18/2019   HCT 36.8 (L) 10/18/2019   MCV 93.4 10/18/2019   PLT 319 10/18/2019   Lab Results  Component Value Date   ALT 14 10/18/2019   AST 15 10/18/2019   ALKPHOS 123 10/18/2019   BILITOT 0.6 10/18/2019     Review of Systems  Constitutional: Negative for fatigue, malaise/fatigue and weight loss.  Eyes: Negative for blurred vision.  Respiratory: Negative for shortness of breath.   Cardiovascular: Negative for chest pain, palpitations, orthopnea and PND.  Endocrine: Negative for polydipsia, polyphagia and polyuria.  Musculoskeletal: Negative for neck pain.  Neurological: Negative for weakness and headaches.  Psychiatric/Behavioral: The patient is not nervous/anxious.     Patient Active Problem List   Diagnosis Date Noted  . Goals  of care, counseling/discussion 08/21/2019  . Leg pain 08/16/2018  . PAD (peripheral artery disease) ([place]) 08/16/2018  . DJD (degenerative joint disease)  08/16/2018  . Essential hypertension 05/11/2015  . Primary prostate adenocarcinoma ([place]) 05/11/2015  . Hyperlipidemia 05/11/2015  . Prostate hypertrophy 05/11/2015  . Taking multiple medications for chronic disease 05/11/2015  . Benign prostatic hyperplasia with urinary obstruction 12/07/2014  . Elevated prostate specific antigen (PSA) 12/04/2014    Allergies  Allergen Reactions  . Lisinopril     coughing coughing    Past Surgical History:  Procedure Laterality Date  . CATARACT EXTRACTION W/PHACO Right 09/27/2017   Procedure: CATARACT EXTRACTION PHACO AND INTRAOCULAR LENS PLACEMENT ([place]) RIGHT;  Surgeon: Leandrew Koyanagi, MD;  Location: [place];  Service: Ophthalmology;  Laterality: Right;  IVA TOPICAL DIABETES  . CATARACT EXTRACTION W/PHACO Left 10/18/2017   Procedure: CATARACT EXTRACTION PHACO AND INTRAOCULAR LENS PLACEMENT ([place])  LEFT DIABETIC;  Surgeon: Leandrew Koyanagi, MD;  Location: [place];  Service: Ophthalmology;  Laterality: Left;  DIABETIC-ORAL MED  . COLONOSCOPY  2011   normal- Dr?    Social History   Tobacco Use  . Smoking status: Never Smoker  . Smokeless tobacco: Never Used  Vaping Use  . Vaping Use: Never used  Substance Use Topics  . Alcohol use: No    Alcohol/week: 0.0 standard drinks  . Drug use: No     Medication list has been reviewed and updated.  Current Meds  Medication Sig  . aspirin 81 MG tablet Take 81 mg by mouth daily.  Marland Kitchen gabapentin (NEURONTIN) 100 MG capsule Take 1 capsule (100 mg total) by mouth 2 (two) times daily.  Marland Kitchen glucose blood (GE100 BLOOD GLUCOSE TEST) test strip 1 each by Other route daily. Use as instructed- Dx E11.9  . hydrochlorothiazide (HYDRODIURIL) 25 MG tablet Take 1 tablet (25 mg total) by mouth daily.  . Lancets (FREESTYLE) lancets AS DIRECTED  . megestrol (MEGACE) 20 MG tablet Take 1 tablet (20 mg total) by mouth 2 (two) times daily as needed (Hot flashes).  . simvastatin (ZOCOR) 20 MG  tablet Take 1 tablet (20 mg total) by mouth every morning.    PHQ 2/9 Scores 08/26/2019 05/17/2019 12/07/2018 10/17/2018  PHQ - 2 Score 0 0 0 0  PHQ- 9 Score - 0 0 0    GAD 7 : Generalized Anxiety Score 05/17/2019  Nervous, Anxious, on Edge 0  Control/stop worrying 0  Worry too much - different things 0  Trouble relaxing 0  Restless 0  Easily annoyed or irritable 0  Afraid - awful might happen 0  Total GAD 7 Score 0    BP Readings from Last 3 Encounters:  11/01/19 (!) 142/80  10/18/19 (!) 164/82  08/22/19 (!) 142/66    Physical Exam Vitals and nursing note reviewed.  HENT:     Head: Normocephalic.     Right Ear: External ear normal.     Left Ear: External ear normal.     Nose: Nose normal.  Eyes:     General: No scleral icterus.       Right eye: No discharge.        Left eye: No discharge.     Conjunctiva/sclera: Conjunctivae normal.     Pupils: Pupils are equal, round, and reactive to light.  Neck:     Thyroid: No thyromegaly.     Vascular: No JVD.     Trachea: No tracheal deviation.  Cardiovascular:     Rate and Rhythm: Normal  rate and regular rhythm.     Pulses:          Dorsalis pedis pulses are 1+ on the right side and 1+ on the left side.       Posterior tibial pulses are 1+ on the right side and 1+ on the left side.     Heart sounds: Normal heart sounds. No murmur heard.  No friction rub. No gallop.   Pulmonary:     Effort: No respiratory distress.     Breath sounds: Normal breath sounds. No wheezing or rales.  Abdominal:     General: Bowel sounds are normal.     Palpations: Abdomen is soft. There is no mass.     Tenderness: There is no abdominal tenderness. There is no guarding or rebound.  Musculoskeletal:        General: No tenderness. Normal range of motion.     Cervical back: Normal range of motion and neck supple.  Feet:     Right foot:     Protective Sensation: 10 sites tested. 10 sites sensed.     Skin integrity: Dry skin present. No ulcer,  blister, skin breakdown, erythema, warmth, callus or fissure.     Toenail Condition: Right toenails are normal.     Left foot:     Protective Sensation: 10 sites tested. 10 sites sensed.     Skin integrity: Dry skin present. No ulcer, blister, skin breakdown, erythema, warmth, callus or fissure.     Toenail Condition: Left toenails are normal.  Lymphadenopathy:     Cervical: No cervical adenopathy.  Skin:    General: Skin is warm.     Findings: No rash.  Neurological:     Mental Status: He is alert and oriented to person, place, and time.     Cranial Nerves: No cranial nerve deficit.     Deep Tendon Reflexes: Reflexes are normal and symmetric.     Wt Readings from Last 3 Encounters:  11/01/19 233 lb (105.7 kg)  10/18/19 239 lb 14.4 oz (108.8 kg)  08/22/19 236 lb (107 kg)    BP (!) 142/80   Pulse 80   Ht 6\' 2"  (1.88 m)   Wt 233 lb (105.7 kg)   BMI 29.92 kg/m   Assessment and Plan: 1. Essential hypertension Chronic.  Controlled.  Stable.  Continue hydrochlorothiazide 25 mg once a day.  Labs were reviewed from earlier in the month and they are acceptable for current evaluation. - hydrochlorothiazide (HYDRODIURIL) 25 MG tablet; Take 1 tablet (25 mg total) by mouth daily.  Dispense: 90 tablet; Refill: 1  2. Familial hypercholesterolemia Chronic.  Controlled.  Stable.  Patient is going to be needed to be controlled in a 7290 range on his LDL given his PAD.  Patient unfortunately had a sausage biscuit for breakfast and this is not the appropriate time for evaluation of lipid panel so patient will be returning next week at 8:00 for lipid panel and A1c.  In the meantime he will continue his simvastatin 20 mg once a day. - simvastatin (ZOCOR) 20 MG tablet; Take 1 tablet (20 mg total) by mouth every morning.  Dispense: 90 tablet; Refill: 1 - Lipid Panel With LDL/HDL Ratio  3. Idiopathic peripheral neuropathy Chronic.  Controlled.  Stable.  Continue gabapentin 100 mg twice a day. -  gabapentin (NEURONTIN) 100 MG capsule; Take 1 capsule (100 mg total) by mouth 2 (two) times daily.  Dispense: 180 capsule; Refill: 1  4. PAD (peripheral artery disease) ([place])  Chronic.  Controlled.  Stable.  Review of Dr. Charlotte Sanes notes notes that there is disease but it is not limiting patient as far as his walking or his quality of life at this time.  We will hold on any further studies such as angiogram nor stent procedure.  And treat by optimizing cholesterol control, blood pressure control and continuance of aspirin 81 mg once a day. - hydrochlorothiazide (HYDRODIURIL) 25 MG tablet; Take 1 tablet (25 mg total) by mouth daily.  Dispense: 90 tablet; Refill: 1 - simvastatin (ZOCOR) 20 MG tablet; Take 1 tablet (20 mg total) by mouth every morning.  Dispense: 90 tablet; Refill: 1 - Lipid Panel With LDL/HDL Ratio  5. Prediabetes New onset.  Stable.  Controlled.  Patient is currently taking dietary control but we will check an A1c and as noted in the PAD we will aggressively be approaching this and will likely be initiating Metformin.  Foot exam was done today and it was normal - HgB A1c

## 2019-11-04 DIAGNOSIS — R7303 Prediabetes: Secondary | ICD-10-CM | POA: Diagnosis not present

## 2019-11-04 DIAGNOSIS — I739 Peripheral vascular disease, unspecified: Secondary | ICD-10-CM | POA: Diagnosis not present

## 2019-11-05 LAB — LIPID PANEL WITH LDL/HDL RATIO
Cholesterol, Total: 159 mg/dL (ref 100–199)
HDL: 47 mg/dL (ref >39)
LDL Chol Calc (NIH): 99 mg/dL (ref 0–99)
LDL/HDL Ratio: 2.1 ratio (ref 0.0–3.6)
Triglycerides: 68 mg/dL (ref 0–149)
VLDL Cholesterol Cal: 13 mg/dL (ref 5–40)

## 2019-11-05 LAB — HEMOGLOBIN A1C
Est. average glucose Bld gHb Est-mCnc: 120 mg/dL
Hgb A1c MFr Bld: 5.8 % — ABNORMAL HIGH (ref 4.8–5.6)

## 2019-12-21 IMAGING — CT CT HEAD W/O CM
3 series · 15 of 47 positions shown, 18 images · non-contrast
Comparison: None.

CLINICAL DATA: Vertigo

EXAM:
CT HEAD WITHOUT CONTRAST
TECHNIQUE: Contiguous axial images were obtained from the base of the skull
through the vertex without intravenous contrast.

[Series 2: head wo · axial · 0.44mm/px · z∈[-133,-8]mm · 9 of 30 slices shown, 12 images]
[im 3/30  brain]
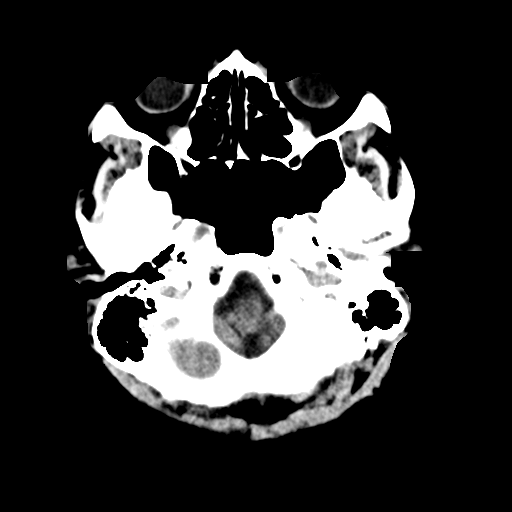
[im 3/30  bone]
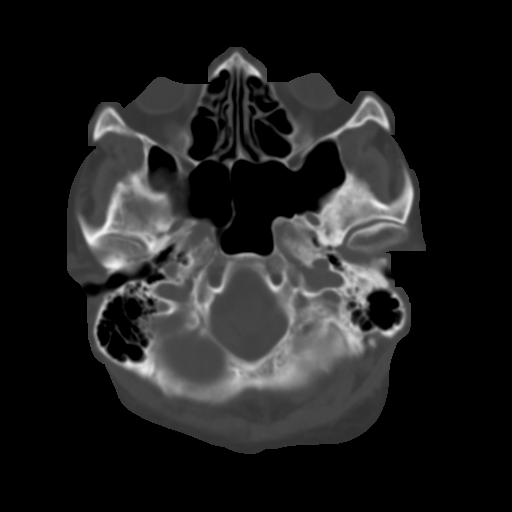
[im 6/30  brain]
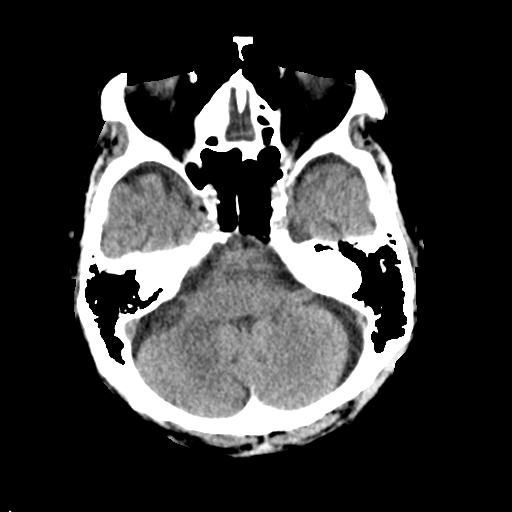
[im 9/30  brain]
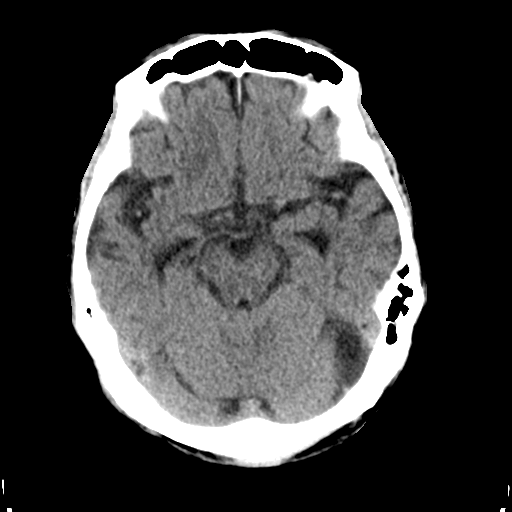
[im 12/30  brain]
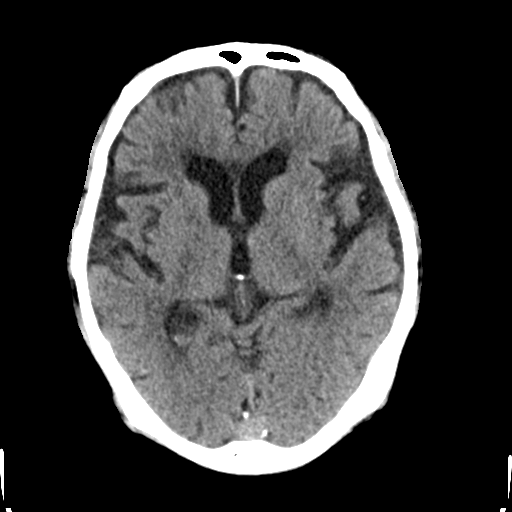
[im 16/30  brain]
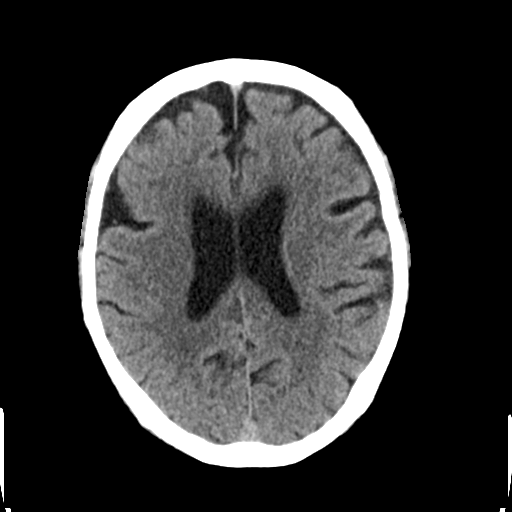
[im 16/30  bone]
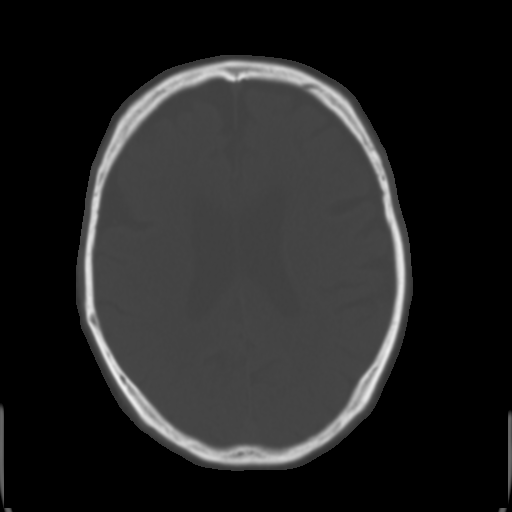
[im 19/30  brain]
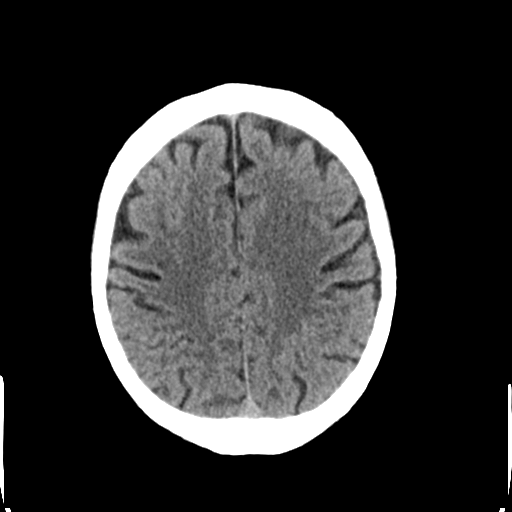
[im 22/30  brain]
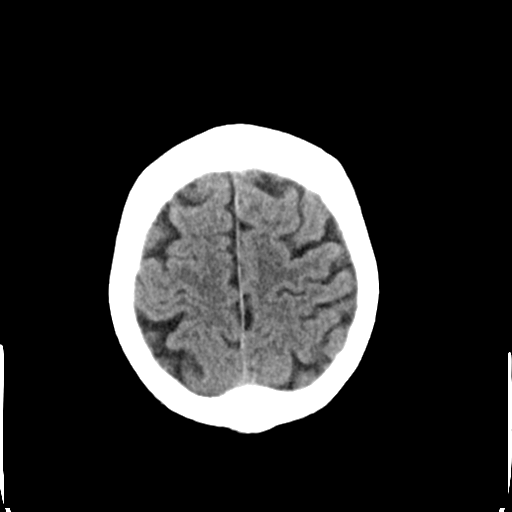
[im 25/30  brain]
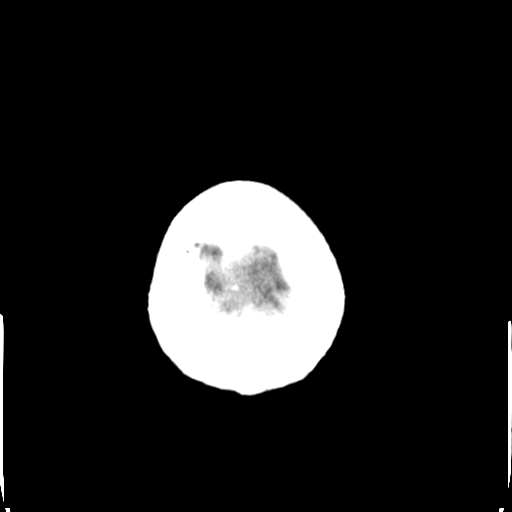
[im 28/30  brain]
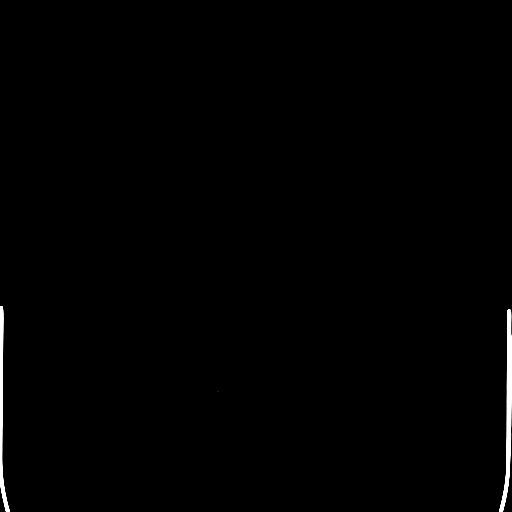
[im 28/30  bone]
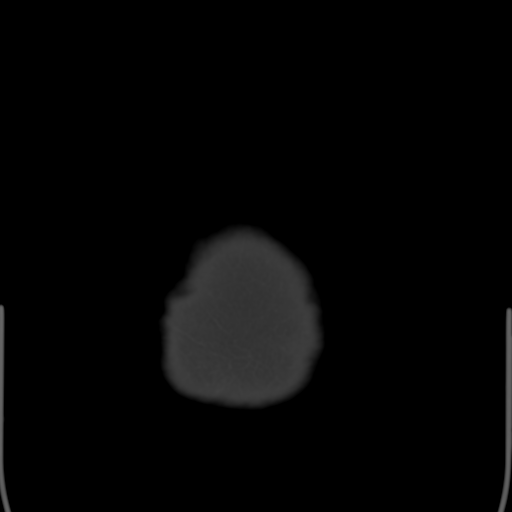

[Series 4: coronal soft tissue · coronal · 0.27mm/px · 3 of 66 slices shown]
[im 22/66  brain]
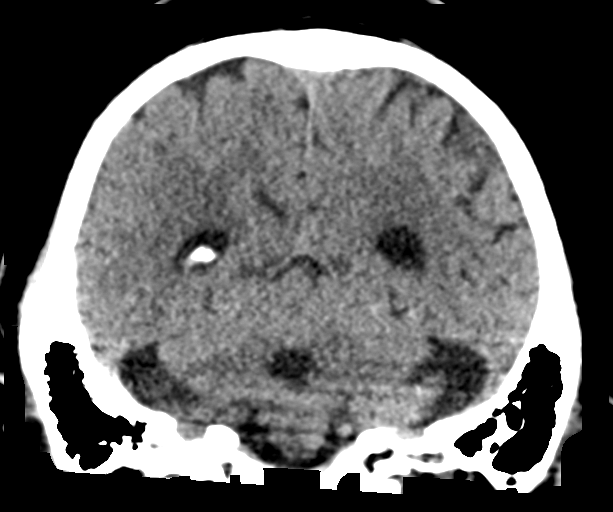
[im 29/66  brain]
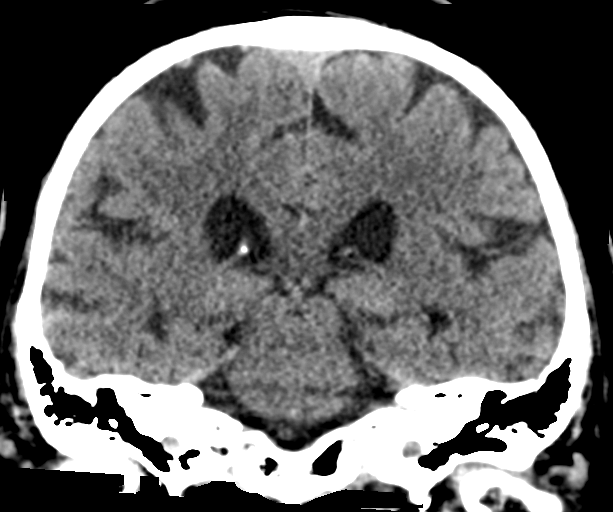
[im 37/66  brain]
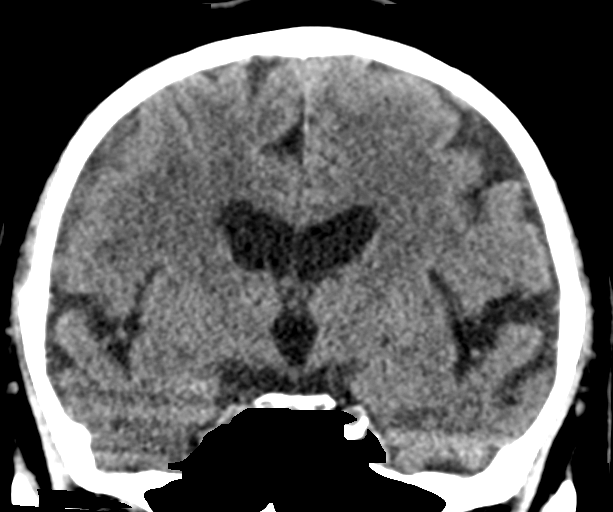

[Series 5: sagittal soft tissue · sagittal · 0.27mm/px · 3 of 55 slices shown]
[im 19/55  brain]
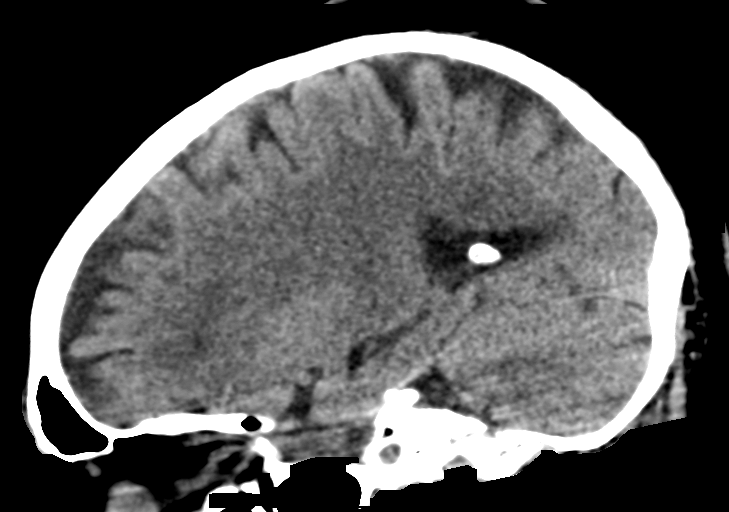
[im 28/55  brain]
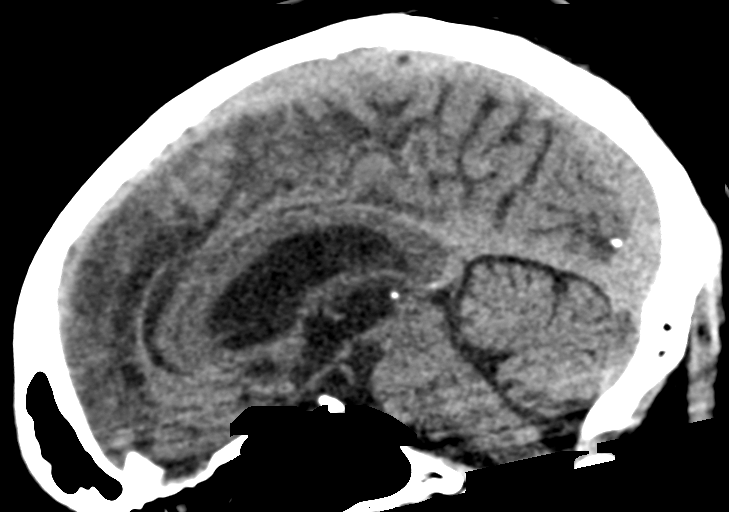
[im 37/55  brain]
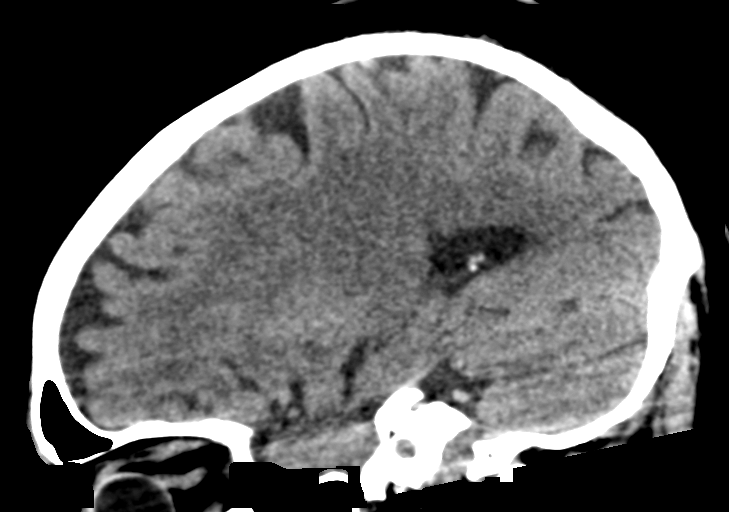

[15 of 47 positions shown; findings below may reference images not displayed]

FINDINGS: Brain: No evidence of acute territorial infarction, hemorrhage,
hydrocephalus,extra-axial collection or mass lesion/mass effect.
There is dilatation the ventricles and sulci consistent with
age-related atrophy. Low-attenuation changes in the deep white
matter consistent with small vessel ischemia.

Vascular: No hyperdense vessel or unexpected calcification.
Calcifications are seen within the internal carotid artery.

Skull: The skull is intact. No fracture or focal lesion identified.

Sinuses/Orbits: The visualized paranasal sinuses and mastoid air
cells are clear. The orbits and globes intact.

Other: None
IMPRESSION: No acute intracranial abnormality.

Findings consistent with age related atrophy and chronic small
vessel ischemia

## 2020-01-06 ENCOUNTER — Telehealth: Payer: Self-pay

## 2020-01-06 NOTE — Chronic Care Management (AMB) (Signed)
  Chronic Care Management   Note  01/06/2020 Name: Brandon Dillon. MRN: [id number] DOB: 1928/02/11  Brandon Dillon. is a 86 y.o. year old male who is a primary care patient of Juline Patch, MD. I reached out to [employer]. by phone today in response to a referral sent by [name]'s health plan.     [name] was given information about Chronic Care Management services today including:  1. CCM service includes personalized support from designated clinical staff supervised by his physician, including individualized plan of care and coordination with other care providers 2. 24/7 contact phone numbers for assistance for urgent and routine care needs. 3. Service will only be billed when office clinical staff spend 20 minutes or more in a month to coordinate care. 4. Only one practitioner may furnish and bill the service in a calendar month. 5. The patient may stop CCM services at any time (effective at the end of the month) by phone call to the office staff. 6. The patient will be responsible for cost sharing (co-pay) of up to 20% of the service fee (after annual deductible is met).  Patient did not agree to enrollment in care management services and does not wish to consider at this time.  Follow up plan: The patient has been provided with contact information for the care management team and has been advised to call with any health related questions or concerns.   Noreene Larsson, [place], [place], [place] [address] Direct Dial: [phone] [name]

## 2020-01-06 NOTE — Chronic Care Management (AMB) (Signed)
  Chronic Care Management   Outreach Note  01/06/2020 Name: Zalyn Amend. MRN: [id number] DOB: [birth year]  Rylan Bernard. is a 86 y.o. year old male who is a primary care patient of Juline Patch, MD. I reached out to [employer]. by phone today in response to a referral sent by Mr. Socrates Cahoon Jr.'s health plan.     An unsuccessful telephone outreach was attempted today. The patient was referred to the case management team for assistance with care management and care coordination.   Follow Up Plan: A HIPAA compliant phone message was left for the patient providing contact information and requesting a return call.  The care management team will reach out to the patient again over the next 7 days.  If patient returns call to provider office, please advise to call [facility]  at [place], [place], [place],  [address] Direct Dial: [phone] [email or web address]: [email or web address]

## 2020-01-17 ENCOUNTER — Telehealth: Payer: Self-pay | Admitting: Oncology

## 2020-01-17 NOTE — Telephone Encounter (Signed)
Writer spoke with patient's wife. Patient's appt was moved from 01-20-20 to 01-21-20.

## 2020-01-20 ENCOUNTER — Inpatient Hospital Stay: Payer: Medicare Other | Admitting: Oncology

## 2020-01-20 ENCOUNTER — Inpatient Hospital Stay: Payer: Medicare Other

## 2020-01-21 ENCOUNTER — Encounter: Payer: Self-pay | Admitting: Oncology

## 2020-01-21 ENCOUNTER — Inpatient Hospital Stay (HOSPITAL_BASED_OUTPATIENT_CLINIC_OR_DEPARTMENT_OTHER): Payer: Medicare Other | Admitting: Oncology

## 2020-01-21 ENCOUNTER — Inpatient Hospital Stay: Payer: Medicare Other | Attending: Oncology

## 2020-01-21 ENCOUNTER — Other Ambulatory Visit: Payer: Self-pay

## 2020-01-21 VITALS — BP 138/78 | HR 78 | Temp 96.7°F | Resp 16 | Wt 231.1 lb

## 2020-01-21 DIAGNOSIS — C61 Malignant neoplasm of prostate: Secondary | ICD-10-CM

## 2020-01-21 DIAGNOSIS — E119 Type 2 diabetes mellitus without complications: Secondary | ICD-10-CM | POA: Insufficient documentation

## 2020-01-21 DIAGNOSIS — I10 Essential (primary) hypertension: Secondary | ICD-10-CM | POA: Insufficient documentation

## 2020-01-21 DIAGNOSIS — C7951 Secondary malignant neoplasm of bone: Secondary | ICD-10-CM | POA: Diagnosis not present

## 2020-01-21 DIAGNOSIS — Z79818 Long term (current) use of other agents affecting estrogen receptors and estrogen levels: Secondary | ICD-10-CM

## 2020-01-21 DIAGNOSIS — Z79899 Other long term (current) drug therapy: Secondary | ICD-10-CM | POA: Insufficient documentation

## 2020-01-21 DIAGNOSIS — R232 Flushing: Secondary | ICD-10-CM | POA: Diagnosis not present

## 2020-01-21 LAB — CBC WITH DIFFERENTIAL/PLATELET
Abs Immature Granulocytes: 0.03 10*3/uL (ref 0.00–0.07)
Basophils Absolute: 0.1 10*3/uL (ref 0.0–0.1)
Basophils Relative: 2 %
Eosinophils Absolute: 0.1 10*3/uL (ref 0.0–0.5)
Eosinophils Relative: 2 %
HCT: 36.6 % — ABNORMAL LOW (ref 39.0–52.0)
Hemoglobin: 12.1 g/dL — ABNORMAL LOW (ref 13.0–17.0)
Immature Granulocytes: 1 %
Lymphocytes Relative: 31 %
Lymphs Abs: 1.4 10*3/uL (ref 0.7–4.0)
MCH: 30.4 pg (ref 26.0–34.0)
MCHC: 33.1 g/dL (ref 30.0–36.0)
MCV: 92 fL (ref 80.0–100.0)
Monocytes Absolute: 0.4 10*3/uL (ref 0.1–1.0)
Monocytes Relative: 8 %
Neutro Abs: 2.6 10*3/uL (ref 1.7–7.7)
Neutrophils Relative %: 56 %
Platelets: 322 10*3/uL (ref 150–400)
RBC: 3.98 MIL/uL — ABNORMAL LOW (ref 4.22–5.81)
RDW: 12.4 % (ref 11.5–15.5)
WBC: 4.5 10*3/uL (ref 4.0–10.5)
nRBC: 0 % (ref 0.0–0.2)

## 2020-01-21 LAB — COMPREHENSIVE METABOLIC PANEL
ALT: 11 U/L (ref 0–44)
AST: 16 U/L (ref 15–41)
Albumin: 4.1 g/dL (ref 3.5–5.0)
Alkaline Phosphatase: 100 U/L (ref 38–126)
Anion gap: 8 (ref 5–15)
BUN: 17 mg/dL (ref 8–23)
CO2: 25 mmol/L (ref 22–32)
Calcium: 9.1 mg/dL (ref 8.9–10.3)
Chloride: 106 mmol/L (ref 98–111)
Creatinine, Ser: 1.32 mg/dL — ABNORMAL HIGH (ref 0.61–1.24)
GFR calc Af Amer: 55 mL/min — ABNORMAL LOW (ref >60)
GFR calc non Af Amer: 47 mL/min — ABNORMAL LOW (ref >60)
Glucose, Bld: 182 mg/dL — ABNORMAL HIGH (ref 70–99)
Potassium: 3.9 mmol/L (ref 3.5–5.1)
Sodium: 139 mmol/L (ref 135–145)
Total Bilirubin: 0.7 mg/dL (ref 0.3–1.2)
Total Protein: 7.5 g/dL (ref 6.5–8.1)

## 2020-01-21 LAB — PSA: Prostatic Specific Antigen: 1.2 ng/mL (ref 0.00–4.00)

## 2020-01-24 NOTE — Progress Notes (Signed)
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 At that time he had staging evaluation consistent with metastatic disease to the sacrum and pelvis and was started on Lupron every 6 months. Last bone scan done on March 2019 again showed metastatic involvement of the right acetabulum and iliac wing and right sacrum. Patient has been responding to Lupron well and his PSA went down from 6.6 in 20 16-0.5 in 2019. It went up to 1.2 on 08/13/2019. Patient has therefore been referred to [place] for consideration of additional treatments for prostate cancer patient is doing well for his age and remains independent of his ADLs.   Interval history-reports tolerating ADT well without any significant side effects.  He does have some mild hot flashes which are self-limited.  ECOG PS- 1 Pain scale- 0  Review of systems- Review of Systems  Constitutional: Positive for malaise/fatigue. Negative for chills, fever and weight loss.  HENT: Negative for congestion, ear discharge and nosebleeds.   Eyes: Negative for blurred vision.  Respiratory: Negative for cough, hemoptysis, sputum production, shortness of breath and wheezing.   Cardiovascular: Negative for chest pain, palpitations, orthopnea and claudication.  Gastrointestinal: Negative for abdominal pain, blood in stool, constipation, diarrhea, heartburn, melena, nausea and vomiting.  Genitourinary: Negative for dysuria, flank pain, frequency, hematuria  and urgency.  Musculoskeletal: Negative for back pain, joint pain and myalgias.  Skin: Negative for rash.  Neurological: Negative for dizziness, tingling, focal weakness, seizures, weakness and headaches.       Hot flashes  Endo/Heme/Allergies: Does not bruise/bleed easily.  Psychiatric/Behavioral: Negative for depression and suicidal ideas. The patient does not have insomnia.       Allergies  Allergen Reactions  . Lisinopril     coughing coughing     Past Medical History:  Diagnosis Date  . Diabetes mellitus without complication ([place])   . Hearing loss   . Hx of fracture of wrist 07/20/2017   >30 years ago. <1989  . Hyperlipidemia   . Hypertension   . Prostate cancer [facility])      Past Surgical History:  Procedure Laterality Date  . CATARACT EXTRACTION W/PHACO Right 09/27/2017   Procedure: CATARACT EXTRACTION PHACO AND INTRAOCULAR LENS PLACEMENT ([place]) RIGHT;  Surgeon: Leandrew Koyanagi, MD;  Location: [place];  Service: Ophthalmology;  Laterality: Right;  IVA TOPICAL DIABETES  . CATARACT EXTRACTION W/PHACO Left 10/18/2017   Procedure: CATARACT EXTRACTION PHACO AND INTRAOCULAR LENS PLACEMENT ([place])  LEFT DIABETIC;  Surgeon: Leandrew Koyanagi, MD;  Location: [place];  Service: Ophthalmology;  Laterality: Left;  DIABETIC-ORAL MED  . COLONOSCOPY  2011   normal- Dr?    Social History   Socioeconomic History  . Marital status: Married    Spouse name: Not on file  . Number of children: Not on file  . Years of education: Not on file  . Highest education level: Not on file  Occupational History  . Not on file  Tobacco Use  . Smoking status: Never Smoker  . Smokeless tobacco: Never Used  Vaping Use  .  Vaping Use: Never used  Substance and Sexual Activity  . Alcohol use: No    Alcohol/week: 0.0 standard drinks  . Drug use: No  . Sexual activity: Yes  Other Topics Concern  . Not on file  Social History Narrative   Pt lives with wife.     Social Determinants of Health   Financial Resource Strain: Low Risk   . Difficulty of Paying Living Expenses: Not very hard  Food Insecurity: No Food Insecurity  . Worried About Charity fundraiser in the Last Year: Never true  . Ran Out of Food in the Last Year: Never true  Transportation Needs: No Transportation Needs  . Lack of Transportation (Medical): No  . Lack of Transportation (Non-Medical): No  Physical Activity: Inactive  . Days of Exercise per Week: 0 days  . Minutes of Exercise per Session: 0 min  Stress: No Stress Concern Present  . Feeling of Stress : Not at all  Social Connections: Unknown  . Frequency of Communication with Friends and Family: Patient refused  . Frequency of Social Gatherings with Friends and Family: Patient refused  . Attends Religious Services: Patient refused  . Active Member of Clubs or Organizations: Patient refused  . Attends Archivist Meetings: Patient refused  . Marital Status: Married  Human resources officer Violence: Not At Risk  . Fear of Current or Ex-Partner: No  . Emotionally Abused: No  . Physically Abused: No  . Sexually Abused: No    Family History  Family history unknown: Yes     Current Outpatient Medications:  .  aspirin 81 MG tablet, Take 81 mg by mouth daily., Disp: , Rfl:  .  gabapentin (NEURONTIN) 100 MG capsule, Take 1 capsule (100 mg total) by mouth 2 (two) times daily., Disp: 180 capsule, Rfl: 1 .  glucose blood (GE100 BLOOD GLUCOSE TEST) test strip, 1 each by Other route daily. Use as instructed- Dx E11.9, Disp: 100 each, Rfl: 1 .  hydrochlorothiazide (HYDRODIURIL) 25 MG tablet, Take 1 tablet (25 mg total) by mouth daily., Disp: 90 tablet, Rfl: 1 .  Lancets (FREESTYLE) lancets, AS DIRECTED, Disp: 100 each, Rfl: 0 .  megestrol (MEGACE) 20 MG tablet, Take 1 tablet (20 mg total) by mouth 2 (two) times daily as needed (Hot flashes)., Disp: 60 tablet, Rfl: 1 .  simvastatin (ZOCOR) 20 MG tablet, Take 1 tablet (20 mg  total) by mouth every morning., Disp: 90 tablet, Rfl: 1  Physical exam:  Vitals:   01/21/20 0853 01/21/20 0857  BP: 138/78 138/78  Pulse: 78 78  Resp: 16   Temp: (!) 96.7 F (35.9 C) (!) 96.7 F (35.9 C)  TempSrc:  Tympanic  SpO2: 100%   Weight: 231 lb 1.6 oz (104.8 kg)    Physical Exam Constitutional:      General: He is not in acute distress. Cardiovascular:     Rate and Rhythm: Normal rate and regular rhythm.     Heart sounds: Normal heart sounds.  Pulmonary:     Effort: Pulmonary effort is normal.     Breath sounds: Normal breath sounds.  Abdominal:     General: Bowel sounds are normal.     Palpations: Abdomen is soft.  Skin:    General: Skin is warm and dry.  Neurological:     Mental Status: He is alert and oriented to person, place, and time.      CMP Latest Ref Rng & Units 01/21/2020  Glucose 70 - 99 mg/dL 182(H)  BUN 8 - 23 mg/dL 17  Creatinine 0.61 - 1.24 mg/dL 1.32(H)  Sodium 135 - 145 mmol/L 139  Potassium 3.5 - 5.1 mmol/L 3.9  Chloride 98 - 111 mmol/L 106  CO2 22 - 32 mmol/L 25  Calcium 8.9 - 10.3 mg/dL 9.1  Total Protein 6.5 - 8.1 g/dL 7.5  Total Bilirubin 0.3 - 1.2 mg/dL 0.7  Alkaline Phos 38 - 126 U/L 100  AST 15 - 41 U/L 16  ALT 0 - 44 U/L 11   CBC Latest Ref Rng & Units 01/21/2020  WBC 4.0 - 10.5 K/uL 4.5  Hemoglobin 13.0 - 17.0 g/dL 12.1(L)  Hematocrit 39 - 52 % 36.6(L)  Platelets 150 - 400 K/uL 322      Assessment and plan- Patient is a 86 y.o. 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  He is currently getting ADT through urology and this is a routine follow-up visit  PSA from 01/21/2020 was 1.2. April and June values were 1.2 and 1.1 respectively. PSA is remaining stable on ADT. Given his age and stable PSA, I will hold off on adding additional oral androgen deprivation therapy. He is getting lupron through urology and tolerating it well so far.  Cbc with diff,cmp and psa in 3 and 6 months. See me in 6  months   Visit Diagnosis 1. Primary prostate adenocarcinoma ([place])   2. Androgen deprivation therapy      Dr. Randa Evens, MD, MPH [facility] [phone] 01/24/2020 12:50 PM

## 2020-01-29 ENCOUNTER — Ambulatory Visit (INDEPENDENT_AMBULATORY_CARE_PROVIDER_SITE_OTHER): Payer: Medicare Other

## 2020-01-29 ENCOUNTER — Other Ambulatory Visit: Payer: Self-pay

## 2020-01-29 DIAGNOSIS — Z23 Encounter for immunization: Secondary | ICD-10-CM | POA: Diagnosis not present

## 2020-02-10 ENCOUNTER — Other Ambulatory Visit: Payer: Self-pay

## 2020-02-10 ENCOUNTER — Ambulatory Visit (INDEPENDENT_AMBULATORY_CARE_PROVIDER_SITE_OTHER): Payer: Medicare Other

## 2020-02-10 DIAGNOSIS — C61 Malignant neoplasm of prostate: Secondary | ICD-10-CM

## 2020-02-10 MED ORDER — LEUPROLIDE ACETATE (6 MONTH) 45 MG ~~LOC~~ KIT
45.0000 mg | PACK | Freq: Once | SUBCUTANEOUS | Status: AC
  Administered 2020-02-10: 45 mg via SUBCUTANEOUS

## 2020-02-10 NOTE — Patient Instructions (Signed)

## 2020-02-10 NOTE — Progress Notes (Signed)
Eligard SubQ Injection   Due to Prostate Cancer patient is present today for a Eligard Injection.  Medication: Eligard 6 month Dose: 45 mg  Location: right upper outer hip  Lot: [id number] Exp: 05/2021  Patient tolerated well, no complications were noted.  Performed by: Gordy Clement, [place]   Per Dr. Bernardo Heater patient is to continue therapy for 21yrs. Patient also being closely monitored by oncology for rise in PSA.  Patient's next follow up was scheduled for August 07 2020. This appointment was scheduled using wheel and given to patient today along with reminder continue on Vitamin D 800-1000iu and Calium 1000-1200mg  daily while on Androgen Deprivation Therapy.  PA approval dates: PA good through 08/12/2020.

## 2020-02-19 NOTE — Progress Notes (Signed)
MRN : [id number]  Brandon Dillon. is a 86 y.o. (1928/03/09) male who presents with chief complaint of leg pain.  History of Present Illness:  The patient returns to the office for followup and review of the noninvasive studies. There have been no interval changes in lower extremity symptoms. No interval shortening of the patient's claudication distance or development of rest pain symptoms. No new ulcers or wounds have occurred since the last visit.  There have been no significant changes to the patient's overall health care.  The patient denies amaurosis fugax or recent TIA symptoms. There are no recent neurological changes noted. The patient denies history of DVT, PE or superficial thrombophlebitis. The patient denies recent episodes of angina or shortness of breath.   ABI Rt=0.70 and Lt=1.01  (previous ABI's Rt=0.61 and Lt=0.90)  No outpatient medications have been marked as taking for the 02/20/20 encounter (Appointment) with Delana Meyer, Dolores Lory, MD.    Past Medical History:  Diagnosis Date  . Diabetes mellitus without complication ([place])   . Hearing loss   . Hx of fracture of wrist 07/20/2017   >30 years ago. <1989  . Hyperlipidemia   . Hypertension   . Prostate cancer [facility])     Past Surgical History:  Procedure Laterality Date  . CATARACT EXTRACTION W/PHACO Right 09/27/2017   Procedure: CATARACT EXTRACTION PHACO AND INTRAOCULAR LENS PLACEMENT ([place]) RIGHT;  Surgeon: Leandrew Koyanagi, MD;  Location: [place];  Service: Ophthalmology;  Laterality: Right;  IVA TOPICAL DIABETES  . CATARACT EXTRACTION W/PHACO Left 10/18/2017   Procedure: CATARACT EXTRACTION PHACO AND INTRAOCULAR LENS PLACEMENT ([place])  LEFT DIABETIC;  Surgeon: Leandrew Koyanagi, MD;  Location: [place];  Service: Ophthalmology;  Laterality: Left;  DIABETIC-ORAL MED  . COLONOSCOPY  2011   normal- Dr?    Social History Social History   Tobacco Use  . Smoking status: Never  Smoker  . Smokeless tobacco: Never Used  Vaping Use  . Vaping Use: Never used  Substance Use Topics  . Alcohol use: No    Alcohol/week: 0.0 standard drinks  . Drug use: No    Family History Family History  Family history unknown: Yes    Allergies  Allergen Reactions  . Lisinopril     coughing coughing     REVIEW OF SYSTEMS (Negative unless checked)  Constitutional: [] Weight loss  [] Fever  [] Chills Cardiac: [] Chest pain   [] Chest pressure   [] Palpitations   [] Shortness of breath when laying flat   [] Shortness of breath with exertion. Vascular:  [x] Pain in legs with walking   [] Pain in legs at rest  [] History of DVT   [] Phlebitis   [] Swelling in legs   [] Varicose veins   [] Non-healing ulcers Pulmonary:   [] Uses home oxygen   [] Productive cough   [] Hemoptysis   [] Wheeze  [] COPD   [] Asthma Neurologic:  [] Dizziness   [] Seizures   [] History of stroke   [] History of TIA  [] Aphasia   [] Vissual changes   [] Weakness or numbness in arm   [] Weakness or numbness in leg Musculoskeletal:   [] Joint swelling   [] Joint pain   [] Low back pain Hematologic:  [] Easy bruising  [] Easy bleeding   [] Hypercoagulable state   [] Anemic Gastrointestinal:  [] Diarrhea   [] Vomiting  [] Gastroesophageal reflux/heartburn   [] Difficulty swallowing. Genitourinary:  [] Chronic kidney disease   [] Difficult urination  [] Frequent urination   [] Blood in urine Skin:  [] Rashes   [] Ulcers  Psychological:  [] History of anxiety   []  History of major  depression.  Physical Examination  There were no vitals filed for this visit. There is no height or weight on file to calculate BMI. Gen: WD/WN, NAD Head: [place]/AT, No temporalis wasting.  Ear/Nose/Throat: Hearing grossly intact, nares w/o erythema or drainage Eyes: PER, EOMI, sclera nonicteric.  Neck: Supple, no large masses.   Pulmonary:  Good air movement, no audible wheezing bilaterally, no use of accessory muscles.  Cardiac: RRR, no JVD Vascular:  Vessel Right Left    Radial Palpable Palpable  PT Not Palpable Not Palpable  DP Palpable Palpable  Gastrointestinal: Non-distended. No guarding/no peritoneal signs.  Musculoskeletal: M/S 5/5 throughout.  No deformity or atrophy.  Neurologic: CN 2-12 intact. Symmetrical.  Speech is fluent. Motor exam as listed above. Psychiatric: Judgment intact, Mood & affect appropriate for pt's clinical situation. Dermatologic: No rashes or ulcers noted.  No changes consistent with cellulitis.  CBC Lab Results  Component Value Date   WBC 4.5 01/21/2020   HGB 12.1 (L) 01/21/2020   HCT 36.6 (L) 01/21/2020   MCV 92.0 01/21/2020   PLT 322 01/21/2020    BMET    Component Value Date/Time   NA 139 01/21/2020 0814   NA 142 05/17/2019 1115   K 3.9 01/21/2020 0814   CL 106 01/21/2020 0814   CO2 25 01/21/2020 0814   GLUCOSE 182 (H) 01/21/2020 0814   BUN 17 01/21/2020 0814   BUN 17 05/17/2019 1115   CREATININE 1.32 (H) 01/21/2020 0814   CALCIUM 9.1 01/21/2020 0814   GFRNONAA 47 (L) 01/21/2020 0814   GFRAA 55 (L) 01/21/2020 0814   CrCl cannot be calculated (Patient's most recent lab result is older than the maximum 21 days allowed.).  COAG No results found for: INR, PROTIME  Radiology No results found.  Assessment/Plan 1. PAD (peripheral artery disease) (HCC)  Recommend:  The patient has evidence of atherosclerosis of the lower extremities with claudication.  The patient does not voice lifestyle limiting changes at this point in time.  Noninvasive studies do not suggest clinically significant change.  No invasive studies, angiography or surgery at this time The patient should continue walking and begin a more formal exercise program.  The patient should continue antiplatelet therapy and aggressive treatment of the lipid abnormalities  No changes in the patient's medications at this time  The patient should continue wearing graduated compression socks 10-15 mmHg strength to control the mild edema.   - VAS  [place] ABI WITH/WO TBI; Future  2. Essential hypertension Continue antihypertensive medications as already ordered, these medications have been reviewed and there are no changes at this time.   3. Primary osteoarthritis involving multiple joints Continue NSAID medications as already ordered, these medications have been reviewed and there are no changes at this time.  Continued activity and therapy was stressed.   4. Mixed hyperlipidemia Continue statin as ordered and reviewed, no changes at this time    Hortencia Pilar, MD  02/19/2020 10:43 AM

## 2020-02-20 ENCOUNTER — Ambulatory Visit (INDEPENDENT_AMBULATORY_CARE_PROVIDER_SITE_OTHER): Payer: Medicare Other | Admitting: Vascular Surgery

## 2020-02-20 ENCOUNTER — Ambulatory Visit (INDEPENDENT_AMBULATORY_CARE_PROVIDER_SITE_OTHER): Payer: Medicare Other

## 2020-02-20 ENCOUNTER — Encounter (INDEPENDENT_AMBULATORY_CARE_PROVIDER_SITE_OTHER): Payer: Self-pay | Admitting: Vascular Surgery

## 2020-02-20 ENCOUNTER — Other Ambulatory Visit: Payer: Self-pay

## 2020-02-20 VITALS — BP 154/73 | HR 81 | Resp 18 | Ht 74.0 in | Wt 227.0 lb

## 2020-02-20 DIAGNOSIS — E782 Mixed hyperlipidemia: Secondary | ICD-10-CM

## 2020-02-20 DIAGNOSIS — M8949 Other hypertrophic osteoarthropathy, multiple sites: Secondary | ICD-10-CM

## 2020-02-20 DIAGNOSIS — I739 Peripheral vascular disease, unspecified: Secondary | ICD-10-CM

## 2020-02-20 DIAGNOSIS — I10 Essential (primary) hypertension: Secondary | ICD-10-CM

## 2020-02-20 DIAGNOSIS — M159 Polyosteoarthritis, unspecified: Secondary | ICD-10-CM

## 2020-03-07 ENCOUNTER — Encounter (INDEPENDENT_AMBULATORY_CARE_PROVIDER_SITE_OTHER): Payer: Self-pay | Admitting: Vascular Surgery

## 2020-04-21 ENCOUNTER — Other Ambulatory Visit: Payer: Medicare Other

## 2020-04-23 ENCOUNTER — Inpatient Hospital Stay: Payer: Medicare Other | Attending: Oncology

## 2020-04-23 DIAGNOSIS — C61 Malignant neoplasm of prostate: Secondary | ICD-10-CM | POA: Insufficient documentation

## 2020-04-23 LAB — COMPREHENSIVE METABOLIC PANEL
ALT: 14 U/L (ref 0–44)
AST: 16 U/L (ref 15–41)
Albumin: 4.3 g/dL (ref 3.5–5.0)
Alkaline Phosphatase: 109 U/L (ref 38–126)
Anion gap: 7 (ref 5–15)
BUN: 24 mg/dL — ABNORMAL HIGH (ref 8–23)
CO2: 30 mmol/L (ref 22–32)
Calcium: 9.2 mg/dL (ref 8.9–10.3)
Chloride: 101 mmol/L (ref 98–111)
Creatinine, Ser: 1.28 mg/dL — ABNORMAL HIGH (ref 0.61–1.24)
GFR, Estimated: 53 mL/min — ABNORMAL LOW (ref >60)
Glucose, Bld: 123 mg/dL — ABNORMAL HIGH (ref 70–99)
Potassium: 4 mmol/L (ref 3.5–5.1)
Sodium: 138 mmol/L (ref 135–145)
Total Bilirubin: 0.8 mg/dL (ref 0.3–1.2)
Total Protein: 7.3 g/dL (ref 6.5–8.1)

## 2020-04-23 LAB — CBC WITH DIFFERENTIAL/PLATELET
Abs Immature Granulocytes: 0.03 10*3/uL (ref 0.00–0.07)
Basophils Absolute: 0.1 10*3/uL (ref 0.0–0.1)
Basophils Relative: 1 %
Eosinophils Absolute: 0.2 10*3/uL (ref 0.0–0.5)
Eosinophils Relative: 3 %
HCT: 38.3 % — ABNORMAL LOW (ref 39.0–52.0)
Hemoglobin: 12.6 g/dL — ABNORMAL LOW (ref 13.0–17.0)
Immature Granulocytes: 1 %
Lymphocytes Relative: 34 %
Lymphs Abs: 2 10*3/uL (ref 0.7–4.0)
MCH: 30.7 pg (ref 26.0–34.0)
MCHC: 32.9 g/dL (ref 30.0–36.0)
MCV: 93.4 fL (ref 80.0–100.0)
Monocytes Absolute: 0.5 10*3/uL (ref 0.1–1.0)
Monocytes Relative: 9 %
Neutro Abs: 3 10*3/uL (ref 1.7–7.7)
Neutrophils Relative %: 52 %
Platelets: 312 10*3/uL (ref 150–400)
RBC: 4.1 MIL/uL — ABNORMAL LOW (ref 4.22–5.81)
RDW: 12.5 % (ref 11.5–15.5)
WBC: 5.7 10*3/uL (ref 4.0–10.5)
nRBC: 0 % (ref 0.0–0.2)

## 2020-04-23 LAB — PSA: Prostatic Specific Antigen: 1.44 ng/mL (ref 0.00–4.00)

## 2020-05-28 DIAGNOSIS — H6123 Impacted cerumen, bilateral: Secondary | ICD-10-CM | POA: Diagnosis not present

## 2020-06-04 DIAGNOSIS — E119 Type 2 diabetes mellitus without complications: Secondary | ICD-10-CM | POA: Diagnosis not present

## 2020-06-04 LAB — HM DIABETES EYE EXAM

## 2020-06-12 ENCOUNTER — Other Ambulatory Visit: Payer: Self-pay

## 2020-06-23 ENCOUNTER — Other Ambulatory Visit: Payer: Self-pay | Admitting: Family Medicine

## 2020-06-23 DIAGNOSIS — I739 Peripheral vascular disease, unspecified: Secondary | ICD-10-CM

## 2020-06-23 DIAGNOSIS — G609 Hereditary and idiopathic neuropathy, unspecified: Secondary | ICD-10-CM

## 2020-06-23 DIAGNOSIS — E7801 Familial hypercholesterolemia: Secondary | ICD-10-CM

## 2020-06-23 DIAGNOSIS — I10 Essential (primary) hypertension: Secondary | ICD-10-CM

## 2020-06-23 MED ORDER — GABAPENTIN 100 MG PO CAPS: 100.0000 mg | ORAL_CAPSULE | Freq: Two times a day (BID) | ORAL | 0 refills | Status: DC

## 2020-06-23 MED ORDER — SIMVASTATIN 20 MG PO TABS: 20.0000 mg | ORAL_TABLET | Freq: Every morning | ORAL | 0 refills | Status: DC

## 2020-06-23 MED ORDER — HYDROCHLOROTHIAZIDE 25 MG PO TABS: 25.0000 mg | ORAL_TABLET | Freq: Every day | ORAL | 0 refills | Status: DC

## 2020-06-23 NOTE — Telephone Encounter (Signed)
Courtesy refill vm left for patient to contact office

## 2020-06-23 NOTE — Telephone Encounter (Signed)
Medication Refill - Medication: simvastatin (ZOCOR) 20 MG tablet   hydrochlorothiazide (HYDRODIURIL) 25 MG tablet  gabapentin (NEURONTIN) 100 MG capsule  Has the patient contacted their pharmacy? Yes.   (Agent: If no, request that the patient contact the pharmacy for the refill.) (Agent: If yes, when and what did the pharmacy advise?) no refills/ call pcp for refills   Preferred Pharmacy (with phone number or street name): [place], [place] - [address]  [place], [place] [address]  Phone:  [phone] Fax:  [phone]  Agent: Please be advised that RX refills may take up to 3 business days. We ask that you follow-up with your pharmacy.

## 2020-07-10 ENCOUNTER — Telehealth: Payer: Self-pay

## 2020-07-10 NOTE — Telephone Encounter (Signed)
-----   Message from Fredderick Severance sent at 06/22/2020  3:15 PM EST ----- Call and sched. March appt for med refills

## 2020-07-10 NOTE — Telephone Encounter (Signed)
Called patient several times with phone number provided on profile but could not be reached.

## 2020-07-21 ENCOUNTER — Encounter: Payer: Self-pay | Admitting: Oncology

## 2020-07-21 ENCOUNTER — Inpatient Hospital Stay: Payer: Medicare Other | Attending: Oncology

## 2020-07-21 ENCOUNTER — Inpatient Hospital Stay (HOSPITAL_BASED_OUTPATIENT_CLINIC_OR_DEPARTMENT_OTHER): Payer: Medicare Other | Admitting: Oncology

## 2020-07-21 VITALS — BP 145/95 | HR 88 | Temp 98.1°F | Resp 16 | Ht 74.0 in | Wt 236.2 lb

## 2020-07-21 DIAGNOSIS — C61 Malignant neoplasm of prostate: Secondary | ICD-10-CM | POA: Diagnosis not present

## 2020-07-21 DIAGNOSIS — C7951 Secondary malignant neoplasm of bone: Secondary | ICD-10-CM | POA: Diagnosis not present

## 2020-07-21 DIAGNOSIS — Z79818 Long term (current) use of other agents affecting estrogen receptors and estrogen levels: Secondary | ICD-10-CM

## 2020-07-21 LAB — CBC WITH DIFFERENTIAL/PLATELET
Abs Immature Granulocytes: 0.03 10*3/uL (ref 0.00–0.07)
Basophils Absolute: 0.1 10*3/uL (ref 0.0–0.1)
Basophils Relative: 1 %
Eosinophils Absolute: 0.2 10*3/uL (ref 0.0–0.5)
Eosinophils Relative: 3 %
HCT: 39.4 % (ref 39.0–52.0)
Hemoglobin: 12.9 g/dL — ABNORMAL LOW (ref 13.0–17.0)
Immature Granulocytes: 1 %
Lymphocytes Relative: 32 %
Lymphs Abs: 1.9 10*3/uL (ref 0.7–4.0)
MCH: 31 pg (ref 26.0–34.0)
MCHC: 32.7 g/dL (ref 30.0–36.0)
MCV: 94.7 fL (ref 80.0–100.0)
Monocytes Absolute: 0.5 10*3/uL (ref 0.1–1.0)
Monocytes Relative: 9 %
Neutro Abs: 3.1 10*3/uL (ref 1.7–7.7)
Neutrophils Relative %: 54 %
Platelets: 309 10*3/uL (ref 150–400)
RBC: 4.16 MIL/uL — ABNORMAL LOW (ref 4.22–5.81)
RDW: 12.6 % (ref 11.5–15.5)
WBC: 5.7 10*3/uL (ref 4.0–10.5)
nRBC: 0 % (ref 0.0–0.2)

## 2020-07-21 LAB — COMPREHENSIVE METABOLIC PANEL
ALT: 14 U/L (ref 0–44)
AST: 17 U/L (ref 15–41)
Albumin: 4.2 g/dL (ref 3.5–5.0)
Alkaline Phosphatase: 118 U/L (ref 38–126)
Anion gap: 10 (ref 5–15)
BUN: 19 mg/dL (ref 8–23)
CO2: 26 mmol/L (ref 22–32)
Calcium: 9.1 mg/dL (ref 8.9–10.3)
Chloride: 102 mmol/L (ref 98–111)
Creatinine, Ser: 1.22 mg/dL (ref 0.61–1.24)
GFR, Estimated: 56 mL/min — ABNORMAL LOW (ref >60)
Glucose, Bld: 182 mg/dL — ABNORMAL HIGH (ref 70–99)
Potassium: 3.9 mmol/L (ref 3.5–5.1)
Sodium: 138 mmol/L (ref 135–145)
Total Bilirubin: 0.6 mg/dL (ref 0.3–1.2)
Total Protein: 7.4 g/dL (ref 6.5–8.1)

## 2020-07-21 LAB — PSA: Prostatic Specific Antigen: 1.79 ng/mL (ref 0.00–4.00)

## 2020-07-21 NOTE — Progress Notes (Signed)
Pt here for prostates cancer. He has no concerns but spouse says that he wakes up for meals. He goes to sleep afte rbreakfast 9:30 12. Then eats lunch and sometimes he will go back to sleep for 1-2 hours. But he sleeps good at night/ b/p elevated 145/95. No pain. No concerns per pt

## 2020-07-22 NOTE — Progress Notes (Signed)
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 At that time he had staging evaluation consistent with metastatic disease to the sacrum and pelvis and was started on Lupron every 6 months. Last bone scan done on March 2019 again showed metastatic involvement of the right acetabulum and iliac wing and right sacrum. Patient has been responding to Lupron well and his PSA went down from 6.6 in 20 16-0.5 in 2019. It went up to 1.2 on 08/13/2019. Patient has therefore been referred to [place] for consideration of additional treatments for prostate cancer patient is doing well for his age and remains independent of his ADLs.   Interval history-patient is doing well and other than self-limited hot flashes he denies other complaints at this time  ECOG PS- 1 Pain scale- 0   Review of systems- Review of Systems  Constitutional: Positive for malaise/fatigue. Negative for chills, fever and weight loss.  HENT: Negative for congestion, ear discharge and nosebleeds.   Eyes: Negative for blurred vision.  Respiratory: Negative for cough, hemoptysis, sputum production, shortness of breath and wheezing.   Cardiovascular: Negative for chest pain, palpitations, orthopnea and claudication.  Gastrointestinal: Negative for abdominal pain, blood in stool, constipation, diarrhea, heartburn, melena, nausea and vomiting.  Genitourinary:  Negative for dysuria, flank pain, frequency, hematuria and urgency.  Musculoskeletal: Negative for back pain, joint pain and myalgias.  Skin: Negative for rash.  Neurological: Negative for dizziness, tingling, focal weakness, seizures, weakness and headaches.  Endo/Heme/Allergies: Does not bruise/bleed easily.  Psychiatric/Behavioral: Negative for depression and suicidal ideas. The patient does not have insomnia.       Allergies  Allergen Reactions  . Lisinopril     coughing coughing     Past Medical History:  Diagnosis Date  . Diabetes mellitus without complication ([place])   . Hearing loss   . Hx of fracture of wrist 07/20/2017   >30 years ago. <1989  . Hyperlipidemia   . Hypertension   . Prostate cancer [facility])      Past Surgical History:  Procedure Laterality Date  . CATARACT EXTRACTION W/PHACO Right 09/27/2017   Procedure: CATARACT EXTRACTION PHACO AND INTRAOCULAR LENS PLACEMENT ([place]) RIGHT;  Surgeon: Leandrew Koyanagi, MD;  Location: [place];  Service: Ophthalmology;  Laterality: Right;  IVA TOPICAL DIABETES  . CATARACT EXTRACTION W/PHACO Left 10/18/2017   Procedure: CATARACT EXTRACTION PHACO AND INTRAOCULAR LENS PLACEMENT ([place])  LEFT DIABETIC;  Surgeon: Leandrew Koyanagi, MD;  Location: [place];  Service: Ophthalmology;  Laterality: Left;  DIABETIC-ORAL MED  . COLONOSCOPY  2011   normal- Dr?    Social History   Socioeconomic History  . Marital status: Married    Spouse name: Not on file  . Number of children: Not on file  . Years of education: Not on file  . Highest education level: Not on file  Occupational History  . Not on file  Tobacco Use  . Smoking status: Never Smoker  . Smokeless tobacco: Never Used  Vaping Use  .  Vaping Use: Never used  Substance and Sexual Activity  . Alcohol use: No    Alcohol/week: 0.0 standard drinks  . Drug use: No  . Sexual activity: Yes  Other Topics Concern  . Not on file  Social History  Narrative   Pt lives with wife.    Social Determinants of Health   Financial Resource Strain: Low Risk   . Difficulty of Paying Living Expenses: Not very hard  Food Insecurity: No Food Insecurity  . Worried About Charity fundraiser in the Last Year: Never true  . Ran Out of Food in the Last Year: Never true  Transportation Needs: No Transportation Needs  . Lack of Transportation (Medical): No  . Lack of Transportation (Non-Medical): No  Physical Activity: Inactive  . Days of Exercise per Week: 0 days  . Minutes of Exercise per Session: 0 min  Stress: No Stress Concern Present  . Feeling of Stress : Not at all  Social Connections: Unknown  . Frequency of Communication with Friends and Family: Patient refused  . Frequency of Social Gatherings with Friends and Family: Patient refused  . Attends Religious Services: Patient refused  . Active Member of Clubs or Organizations: Patient refused  . Attends Archivist Meetings: Patient refused  . Marital Status: Married  Human resources officer Violence: Not At Risk  . Fear of Current or Ex-Partner: No  . Emotionally Abused: No  . Physically Abused: No  . Sexually Abused: No    Family History  Family history unknown: Yes     Current Outpatient Medications:  .  aspirin 81 MG tablet, Take 81 mg by mouth daily., Disp: , Rfl:  .  gabapentin (NEURONTIN) 100 MG capsule, Take 1 capsule (100 mg total) by mouth 2 (two) times daily., Disp: 180 capsule, Rfl: 0 .  glucose blood (GE100 BLOOD GLUCOSE TEST) test strip, 1 each by Other route daily. Use as instructed- Dx E11.9, Disp: 100 each, Rfl: 1 .  hydrochlorothiazide (HYDRODIURIL) 25 MG tablet, Take 1 tablet (25 mg total) by mouth daily., Disp: 30 tablet, Rfl: 0 .  Lancets (FREESTYLE) lancets, AS DIRECTED, Disp: 100 each, Rfl: 0 .  megestrol (MEGACE) 20 MG tablet, Take 1 tablet (20 mg total) by mouth 2 (two) times daily as needed (Hot flashes)., Disp: 60 tablet, Rfl: 1 .  simvastatin  (ZOCOR) 20 MG tablet, Take 1 tablet (20 mg total) by mouth every morning., Disp: 30 tablet, Rfl: 0  Physical exam:  Vitals:   07/21/20 1048 07/21/20 1100  BP: (!) 145/95 (!) 145/95  Pulse: 88 88  Resp: 16 16  Temp: 98.1 F (36.7 C) 98.1 F (36.7 C)  TempSrc: Oral Oral  Weight: 236 lb 3.2 oz (107.1 kg) 236 lb 3.2 oz (107.1 kg)  Height: 6\' 2"  (1.88 m)    Physical Exam Constitutional:      General: He is not in acute distress. Eyes:     Pupils: Pupils are equal, round, and reactive to light.  Cardiovascular:     Rate and Rhythm: Normal rate and regular rhythm.     Heart sounds: Normal heart sounds.  Pulmonary:     Effort: Pulmonary effort is normal.     Breath sounds: Normal breath sounds.  Abdominal:     General: Bowel sounds are normal.     Palpations: Abdomen is soft.  Skin:    General: Skin is warm and dry.  Neurological:     Mental Status: He is alert and oriented to person,  place, and time.      CMP Latest Ref Rng & Units 07/21/2020  Glucose 70 - 99 mg/dL 182(H)  BUN 8 - 23 mg/dL 19  Creatinine 0.61 - 1.24 mg/dL 1.22  Sodium 135 - 145 mmol/L 138  Potassium 3.5 - 5.1 mmol/L 3.9  Chloride 98 - 111 mmol/L 102  CO2 22 - 32 mmol/L 26  Calcium 8.9 - 10.3 mg/dL 9.1  Total Protein 6.5 - 8.1 g/dL 7.4  Total Bilirubin 0.3 - 1.2 mg/dL 0.6  Alkaline Phos 38 - 126 U/L 118  AST 15 - 41 U/L 17  ALT 0 - 44 U/L 14   CBC Latest Ref Rng & Units 07/21/2020  WBC 4.0 - 10.5 K/uL 5.7  Hemoglobin 13.0 - 17.0 g/dL 12.9(L)  Hematocrit 39.0 - 52.0 % 39.4  Platelets 150 - 400 K/uL 309    No images are attached to the encounter.  No results found.   Assessment and plan- Patient is a 87 y.o. 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  He is here for a routine follow-up visit.  Patient's last bone imaging in May 2021 showed stable radiotracer activity in the right hemipelvis as compared to 2019 findings.  He has been on Lupron with urology.  His  PSA is slowly trending up and is at 1.79 today as compared to a value of 1.159 months ago.  His PSA doubling time remains greater than 10 months.  Patient is 87 years of age and has a good quality of life at this time.  Unless there is a significant increase in his PSA doubling time to less than 10 months I would not be inclined to start additional oral antiandrogen drugs at this time.  Repeat PSA in 3 in 6 months and I will see him back in 6 months   Visit Diagnosis 1. Primary prostate adenocarcinoma ([place])   2. Encounter for monitoring androgen deprivation therapy      Dr. Randa Evens, MD, MPH [facility] at [facility] [phone] 07/22/2020 9:06 AM

## 2020-08-06 ENCOUNTER — Encounter: Payer: Self-pay | Admitting: Urology

## 2020-08-06 ENCOUNTER — Ambulatory Visit (INDEPENDENT_AMBULATORY_CARE_PROVIDER_SITE_OTHER): Payer: Medicare Other | Admitting: Urology

## 2020-08-06 ENCOUNTER — Other Ambulatory Visit: Payer: Self-pay

## 2020-08-06 VITALS — BP 146/69 | HR 98 | Ht 74.0 in | Wt 232.0 lb

## 2020-08-06 DIAGNOSIS — C61 Malignant neoplasm of prostate: Secondary | ICD-10-CM | POA: Diagnosis not present

## 2020-08-06 MED ORDER — LEUPROLIDE ACETATE (6 MONTH) 45 MG ~~LOC~~ KIT
45.0000 mg | PACK | Freq: Once | SUBCUTANEOUS | Status: AC
  Administered 2020-08-06: 45 mg via SUBCUTANEOUS

## 2020-08-06 NOTE — Progress Notes (Signed)
Eligard SubQ Injection   Due to Prostate Cancer patient is present today for a Eligard Injection.  Medication: Eligard 6 month Dose: 45 mg  Location: left  Lot: [id number] Exp: 11/2021  Patient tolerated well, no complications were noted  Performed by: Elberta Leatherwood, [place]  Per Dr. Bernardo Heater patient is to continue therapy for Indefinately. Patient's next follow up was scheduled for 02/02/2021. This appointment was scheduled using wheel and given to patient today along with reminder continue on Vitamin D 800-1000iu and Calium 1000-1200mg  daily while on Androgen Deprivation Therapy.  PA approval dates: Needs new approval, message sent to [facility]

## 2020-08-06 NOTE — Progress Notes (Signed)
08/06/2020 10:03 AM   Brandon Dillon. 1927/12/25 [id number]  Referring provider: Juline Patch, MD [address] [place],  [place] [address]  Chief Complaint  Patient presents with  . Prostate Cancer    Urologic history: 1.  Castrate resistant metastatic prostate cancer -On leuprolide -Followed by medical oncology with PSA every 3 months and OV every 6 months   HPI: 87 year-old male presents for annual follow-up.   Has no complaints  Taking Megace as needed for hot flashes  PSA has slowly trended upward and plan by medical oncology is to hold starting an androgen receptor inhibitor unless PSA starts to significantly increase  No voiding complaints   PMH: Past Medical History:  Diagnosis Date  . Diabetes mellitus without complication ([facility])   . Hearing loss   . Hx of fracture of wrist 07/20/2017   >30 years ago. <1989  . Hyperlipidemia   . Hypertension   . Prostate cancer [facility])     Surgical History: Past Surgical History:  Procedure Laterality Date  . CATARACT EXTRACTION W/PHACO Right 09/27/2017   Procedure: CATARACT EXTRACTION PHACO AND INTRAOCULAR LENS PLACEMENT ([place]) RIGHT;  Surgeon: Leandrew Koyanagi, MD;  Location: [place];  Service: Ophthalmology;  Laterality: Right;  IVA TOPICAL DIABETES  . CATARACT EXTRACTION W/PHACO Left 10/18/2017   Procedure: CATARACT EXTRACTION PHACO AND INTRAOCULAR LENS PLACEMENT ([place])  LEFT DIABETIC;  Surgeon: Leandrew Koyanagi, MD;  Location: [place];  Service: Ophthalmology;  Laterality: Left;  DIABETIC-ORAL MED  . COLONOSCOPY  2011   normal- Dr?    Home Medications:  Allergies as of 08/06/2020      Reactions   Lisinopril    coughing coughing      Medication List       Accurate as of August 06, 2020 10:03 AM. If you have any questions, ask your nurse or doctor.        aspirin 81 MG tablet Take 81 mg by mouth daily.   freestyle lancets AS DIRECTED   gabapentin 100 MG  capsule Commonly known as: NEURONTIN Take 1 capsule (100 mg total) by mouth 2 (two) times daily.   glucose blood test strip Commonly known as: GE100 Blood Glucose Test 1 each by Other route daily. Use as instructed- Dx E11.9   hydrochlorothiazide 25 MG tablet Commonly known as: HYDRODIURIL Take 1 tablet (25 mg total) by mouth daily.   megestrol 20 MG tablet Commonly known as: MEGACE Take 1 tablet (20 mg total) by mouth 2 (two) times daily as needed (Hot flashes).   simvastatin 20 MG tablet Commonly known as: ZOCOR Take 1 tablet (20 mg total) by mouth every morning.       Allergies:  Allergies  Allergen Reactions  . Lisinopril     coughing coughing    Family History: Family History  Family history unknown: Yes    Social History:  reports that he has never smoked. He has never used smokeless tobacco. He reports that he does not drink alcohol and does not use drugs.   Physical Exam: BP (!) 146/69   Pulse 98   Ht 6\' 2"  (1.88 m)   Wt 232 lb (105.2 kg)   BMI 29.79 kg/m   Constitutional:  Alert and oriented, No acute distress. HEENT: [place] AT, moist mucus membranes.  Trachea midline, no masses. Cardiovascular: No clubbing, cyanosis, or edema. Respiratory: Normal respiratory effort, no increased work of breathing. Psychiatric: Normal mood and affect.   Assessment & Plan:  1. Prostate cancer ([place])  Received leuprolide (6 Month) (ELIGARD) 45 mg today  Follow-up leuprolide 6 months and office visit with me 1 year  Continue medical oncology follow-up  DEXA scan ordered   Abbie Sons, [place] [address], [place], [place] [address] [phone]

## 2020-08-07 ENCOUNTER — Other Ambulatory Visit: Payer: Self-pay | Admitting: Family Medicine

## 2020-08-07 ENCOUNTER — Ambulatory Visit: Payer: Self-pay | Admitting: Urology

## 2020-08-07 DIAGNOSIS — I739 Peripheral vascular disease, unspecified: Secondary | ICD-10-CM

## 2020-08-07 DIAGNOSIS — I10 Essential (primary) hypertension: Secondary | ICD-10-CM

## 2020-08-07 NOTE — Telephone Encounter (Signed)
   Notes to clinic:  Patient already given a courtesy refill and has not contact office to schedule    Requested Prescriptions  Pending Prescriptions Disp Refills   hydrochlorothiazide (HYDRODIURIL) 25 MG tablet [Pharmacy Med Name: HYDROCHLOROTHIAZIDE 25 MG TAB] 90 tablet 0    Sig: Take 1 tablet (25 mg total) by mouth daily.      Cardiovascular: Diuretics - Thiazide Failed - 08/07/2020 10:42 AM      Failed - Last BP in normal range    BP Readings from Last 1 Encounters:  08/06/20 (!) 146/69          Failed - Valid encounter within last 6 months    Recent Outpatient Visits           9 months ago Essential hypertension   [place], Brandon C, MD   1 year ago Essential hypertension   [facility] Juline Patch, MD   1 year ago Dizziness   [facility] Juline Patch, MD   1 year ago Prediabetes   [facility] Juline Patch, MD   1 year ago Mixed hyperlipidemia   [facility] Juline Patch, MD       Future Appointments             In 12 months Stoioff, Ronda Fairly, MD [place] in normal range and within 360 days    Calcium  Date Value Ref Range Status  07/21/2020 9.1 8.9 - 10.3 mg/dL Final          Passed - Cr in normal range and within 360 days    Creatinine, Ser  Date Value Ref Range Status  07/21/2020 1.22 0.61 - 1.24 mg/dL Final          Passed - K in normal range and within 360 days    Potassium  Date Value Ref Range Status  07/21/2020 3.9 3.5 - 5.1 mmol/L Final          Passed - Na in normal range and within 360 days    Sodium  Date Value Ref Range Status  07/21/2020 138 135 - 145 mmol/L Final  05/17/2019 142 134 - 144 mmol/L Final

## 2020-08-12 ENCOUNTER — Ambulatory Visit (INDEPENDENT_AMBULATORY_CARE_PROVIDER_SITE_OTHER): Payer: Medicare Other | Admitting: Family Medicine

## 2020-08-12 ENCOUNTER — Other Ambulatory Visit: Payer: Self-pay

## 2020-08-12 ENCOUNTER — Encounter: Payer: Self-pay | Admitting: Family Medicine

## 2020-08-12 VITALS — BP 122/62 | HR 76 | Ht 74.0 in | Wt 236.0 lb

## 2020-08-12 DIAGNOSIS — E7801 Familial hypercholesterolemia: Secondary | ICD-10-CM | POA: Diagnosis not present

## 2020-08-12 DIAGNOSIS — R7303 Prediabetes: Secondary | ICD-10-CM | POA: Diagnosis not present

## 2020-08-12 DIAGNOSIS — G609 Hereditary and idiopathic neuropathy, unspecified: Secondary | ICD-10-CM

## 2020-08-12 DIAGNOSIS — I10 Essential (primary) hypertension: Secondary | ICD-10-CM | POA: Diagnosis not present

## 2020-08-12 DIAGNOSIS — I739 Peripheral vascular disease, unspecified: Secondary | ICD-10-CM | POA: Diagnosis not present

## 2020-08-12 MED ORDER — HYDROCHLOROTHIAZIDE 25 MG PO TABS
25.0000 mg | ORAL_TABLET | Freq: Every day | ORAL | 5 refills | Status: DC
Start: 2020-08-12 — End: 2020-12-11

## 2020-08-12 MED ORDER — SIMVASTATIN 20 MG PO TABS: 20.0000 mg | ORAL_TABLET | Freq: Every morning | ORAL | 5 refills | Status: DC

## 2020-08-12 MED ORDER — GABAPENTIN 100 MG PO CAPS: 100.0000 mg | ORAL_CAPSULE | Freq: Two times a day (BID) | ORAL | 5 refills | Status: DC

## 2020-08-12 NOTE — Progress Notes (Signed)
Date:  08/12/2020   Name:  Brandon Dillon.   DOB:  [birth year]   MRN:  [id number]   Chief Complaint: Hypertension, Hyperlipidemia, Peripheral Neuropathy, and Prediabetes (Diet controlled- 125 this am)  Hypertension This is a chronic problem. The current episode started more than 1 year ago. The problem has been gradually improving since onset. The problem is controlled. Pertinent negatives include no anxiety, blurred vision, chest pain, headaches, malaise/fatigue, neck pain, orthopnea, palpitations, peripheral edema, PND, shortness of breath or sweats. There are no associated agents to hypertension. There are no known risk factors for coronary artery disease. Past treatments include diuretics. The current treatment provides moderate improvement. There are no compliance problems.  There is no history of angina, kidney disease, CAD/MI, CVA, heart failure, left ventricular hypertrophy, PVD or retinopathy. There is no history of chronic renal disease, a hypertension causing med or renovascular disease. Thyroid problem: di.  Hyperlipidemia This is a chronic problem. The current episode started more than 1 year ago. The problem is controlled. Recent lipid tests were reviewed and are normal. He has no history of chronic renal disease, diabetes, hypothyroidism, liver disease, obesity or nephrotic syndrome. Factors aggravating his hyperlipidemia include thiazides. Pertinent negatives include no chest pain, myalgias or shortness of breath. Current antihyperlipidemic treatment includes statins. The current treatment provides moderate improvement of lipids. There are no compliance problems.  Risk factors for coronary artery disease include post-menopausal, hypertension and dyslipidemia.  Diabetes He presents for his follow-up (for prediabetes) diabetic visit. His disease course has been stable. Pertinent negatives for hypoglycemia include no confusion, dizziness, headaches, hunger, mood changes,  nervousness/anxiousness, pallor, seizures, sleepiness, speech difficulty, sweats or tremors. Pertinent negatives for diabetes include no blurred vision, no chest pain, no foot ulcerations, no polydipsia, no polyphagia, no polyuria and no weakness. There are no hypoglycemic complications. Symptoms are stable. There are no diabetic complications. Pertinent negatives for diabetic complications include no CVA, PVD or retinopathy.    Lab Results  Component Value Date   CREATININE 1.22 07/21/2020   BUN 19 07/21/2020   NA 138 07/21/2020   K 3.9 07/21/2020   CL 102 07/21/2020   CO2 26 07/21/2020   Lab Results  Component Value Date   CHOL 159 11/04/2019   HDL 47 11/04/2019   LDLCALC 99 11/04/2019   TRIG 68 11/04/2019   CHOLHDL 3.2 04/04/2018   No results found for: TSH Lab Results  Component Value Date   HGBA1C 5.8 (H) 11/04/2019   Lab Results  Component Value Date   WBC 5.7 07/21/2020   HGB 12.9 (L) 07/21/2020   HCT 39.4 07/21/2020   MCV 94.7 07/21/2020   PLT 309 07/21/2020   Lab Results  Component Value Date   ALT 14 07/21/2020   AST 17 07/21/2020   ALKPHOS 118 07/21/2020   BILITOT 0.6 07/21/2020     Review of Systems  Constitutional: Negative for chills, fever and malaise/fatigue.  HENT: Negative for drooling, ear discharge, ear pain and sore throat.   Eyes: Negative for blurred vision.  Respiratory: Negative for cough, shortness of breath and wheezing.   Cardiovascular: Negative for chest pain, palpitations, orthopnea, leg swelling and PND.  Gastrointestinal: Negative for abdominal pain, blood in stool, constipation, diarrhea and nausea.  Endocrine: Negative for polydipsia, polyphagia and polyuria.  Genitourinary: Negative for dysuria, frequency, hematuria and urgency.  Musculoskeletal: Negative for back pain, myalgias and neck pain.  Skin: Negative for pallor and rash.  Allergic/Immunologic: Negative for environmental allergies.  Neurological: Negative  for dizziness,  tremors, seizures, speech difficulty, weakness and headaches.  Hematological: Does not bruise/bleed easily.  Psychiatric/Behavioral: Negative for confusion and suicidal ideas. The patient is not nervous/anxious.     Patient Active Problem List   Diagnosis Date Noted  . Goals of care, counseling/discussion 08/21/2019  . Leg pain 08/16/2018  . PAD (peripheral artery disease) ([place]) 08/16/2018  . DJD (degenerative joint disease) 08/16/2018  . Essential hypertension 05/11/2015  . Primary prostate adenocarcinoma ([place]) 05/11/2015  . Hyperlipidemia 05/11/2015  . Prostate hypertrophy 05/11/2015  . Taking multiple medications for chronic disease 05/11/2015  . Benign prostatic hyperplasia with urinary obstruction 12/07/2014  . Elevated prostate specific antigen (PSA) 12/04/2014    Allergies  Allergen Reactions  . Lisinopril     coughing coughing    Past Surgical History:  Procedure Laterality Date  . CATARACT EXTRACTION W/PHACO Right 09/27/2017   Procedure: CATARACT EXTRACTION PHACO AND INTRAOCULAR LENS PLACEMENT ([place]) RIGHT;  Surgeon: Leandrew Koyanagi, MD;  Location: [place];  Service: Ophthalmology;  Laterality: Right;  IVA TOPICAL DIABETES  . CATARACT EXTRACTION W/PHACO Left 10/18/2017   Procedure: CATARACT EXTRACTION PHACO AND INTRAOCULAR LENS PLACEMENT ([place])  LEFT DIABETIC;  Surgeon: Leandrew Koyanagi, MD;  Location: [place];  Service: Ophthalmology;  Laterality: Left;  DIABETIC-ORAL MED  . COLONOSCOPY  2011   normal- Dr?    Social History   Tobacco Use  . Smoking status: Never Smoker  . Smokeless tobacco: Never Used  Vaping Use  . Vaping Use: Never used  Substance Use Topics  . Alcohol use: No    Alcohol/week: 0.0 standard drinks  . Drug use: No     Medication list has been reviewed and updated.  Current Meds  Medication Sig  . aspirin 81 MG tablet Take 81 mg by mouth daily.  Marland Kitchen gabapentin (NEURONTIN) 100 MG capsule Take 1 capsule (100 mg  total) by mouth 2 (two) times daily.  Marland Kitchen glucose blood (GE100 BLOOD GLUCOSE TEST) test strip 1 each by Other route daily. Use as instructed- Dx E11.9  . hydrochlorothiazide (HYDRODIURIL) 25 MG tablet Take 1 tablet (25 mg total) by mouth daily.  . Lancets (FREESTYLE) lancets AS DIRECTED  . megestrol (MEGACE) 20 MG tablet Take 1 tablet (20 mg total) by mouth 2 (two) times daily as needed (Hot flashes).  . simvastatin (ZOCOR) 20 MG tablet Take 1 tablet (20 mg total) by mouth every morning.    PHQ 2/9 Scores 08/12/2020 08/26/2019 05/17/2019 12/07/2018  PHQ - 2 Score 0 0 0 0  PHQ- 9 Score 0 - 0 0    GAD 7 : Generalized Anxiety Score 08/12/2020 05/17/2019  Nervous, Anxious, on Edge 0 0  Control/stop worrying 0 0  Worry too much - different things 0 0  Trouble relaxing 0 0  Restless 0 0  Easily annoyed or irritable 0 0  Afraid - awful might happen 0 0  Total GAD 7 Score 0 0    BP Readings from Last 3 Encounters:  08/12/20 122/62  08/06/20 (!) 146/69  07/21/20 (!) 145/95    Physical Exam Vitals and nursing note reviewed.  HENT:     Head: Normocephalic.     Right Ear: Tympanic membrane, ear canal and external ear normal.     Left Ear: Tympanic membrane, ear canal and external ear normal.     Nose: Nose normal.  Eyes:     General: No scleral icterus.       Right eye: No discharge.  Left eye: No discharge.     Conjunctiva/sclera: Conjunctivae normal.     Pupils: Pupils are equal, round, and reactive to light.  Neck:     Thyroid: No thyromegaly.     Vascular: No JVD.     Trachea: No tracheal deviation.  Cardiovascular:     Rate and Rhythm: Normal rate and regular rhythm.     Pulses:          Dorsalis pedis pulses are 2+ on the right side and 2+ on the left side.       Posterior tibial pulses are 1+ on the right side and 1+ on the left side.     Heart sounds: Normal heart sounds. No murmur heard. No friction rub. No gallop.   Pulmonary:     Effort: No respiratory distress.      Breath sounds: Normal breath sounds. No wheezing or rales.  Abdominal:     General: Bowel sounds are normal.     Palpations: Abdomen is soft. There is no mass.     Tenderness: There is no abdominal tenderness. There is no guarding or rebound.  Musculoskeletal:        General: No tenderness. Normal range of motion.     Cervical back: Normal range of motion and neck supple.     Right foot: Normal range of motion. No deformity or bunion.     Left foot: Normal range of motion. No deformity or bunion.  Feet:     Right foot:     Protective Sensation: 10 sites tested. 10 sites sensed.     Skin integrity: Skin integrity normal. No ulcer, blister, skin breakdown, erythema, warmth, callus or dry skin.     Left foot:     Protective Sensation: 10 sites tested. 10 sites sensed.     Skin integrity: Skin integrity normal. No ulcer, blister, skin breakdown, erythema, warmth, callus or dry skin.  Lymphadenopathy:     Cervical: No cervical adenopathy.  Skin:    General: Skin is warm.     Findings: No rash.  Neurological:     Mental Status: He is alert and oriented to person, place, and time.     Cranial Nerves: No cranial nerve deficit.     Deep Tendon Reflexes: Reflexes are normal and symmetric.     Wt Readings from Last 3 Encounters:  08/12/20 236 lb (107 kg)  08/06/20 232 lb (105.2 kg)  07/21/20 236 lb 3.2 oz (107.1 kg)    BP 122/62   Pulse 76   Ht 6\' 2"  (1.88 m)   Wt 236 lb (107 kg)   BMI 30.30 kg/m   Assessment and Plan:  1. Essential hypertension Chronic.  Controlled.  Stable.  Blood pressure today is 122/62.  We will continue hydrochlorothiazide 25 mg daily.  Will check CMP for electrolytes and GFR surveillance. - Comprehensive Metabolic Panel (CMET) - hydrochlorothiazide (HYDRODIURIL) 25 MG tablet; Take 1 tablet (25 mg total) by mouth daily.  Dispense: 30 tablet; Refill: 5  2. Idiopathic peripheral neuropathy Chronic.  Controlled.  Stable.  Peripheral neuropathy is controlled  on Neurontin 100 mg twice a day. - gabapentin (NEURONTIN) 100 MG capsule; Take 1 capsule (100 mg total) by mouth 2 (two) times daily.  Dispense: 60 capsule; Refill: 5  3. Familial hypercholesterolemia Chronic.  Controlled.  Stable.  Continue simvastatin 20 mg once a day.  View of previous lipid panel is acceptable. - simvastatin (ZOCOR) 20 MG tablet; Take 1 tablet (20 mg total) by  mouth every morning.  Dispense: 30 tablet; Refill: 5  4. Prediabetes Chronic.  Controlled.  Stable.  Foot exam was done today.  Will check A1c for current status as well as microalbuminuria and CMP. - HgB A1c - Microalbumin, urine - Comprehensive Metabolic Panel (CMET)  5. PAD (peripheral artery disease) ([facility]) Chronic.  Controlled.  Stable.  Continue hydrochlorothiazide 25 mg and simvastatin 20 mg for control of atherosclerotic buildup. - hydrochlorothiazide (HYDRODIURIL) 25 MG tablet; Take 1 tablet (25 mg total) by mouth daily.  Dispense: 30 tablet; Refill: 5 - simvastatin (ZOCOR) 20 MG tablet; Take 1 tablet (20 mg total) by mouth every morning.  Dispense: 30 tablet; Refill: 5

## 2020-08-13 LAB — COMPREHENSIVE METABOLIC PANEL
ALT: 11 IU/L (ref 0–44)
AST: 11 IU/L (ref 0–40)
Albumin/Globulin Ratio: 1.5 (ref 1.2–2.2)
Albumin: 4.6 g/dL (ref 3.5–4.6)
Alkaline Phosphatase: 149 IU/L — ABNORMAL HIGH (ref 44–121)
BUN/Creatinine Ratio: 14 (ref 10–24)
BUN: 17 mg/dL (ref 10–36)
Bilirubin Total: 0.6 mg/dL (ref 0.0–1.2)
CO2: 22 mmol/L (ref 20–29)
Calcium: 9.6 mg/dL (ref 8.6–10.2)
Chloride: 101 mmol/L (ref 96–106)
Creatinine, Ser: 1.24 mg/dL (ref 0.76–1.27)
Globulin, Total: 3 g/dL (ref 1.5–4.5)
Glucose: 108 mg/dL — ABNORMAL HIGH (ref 65–99)
Potassium: 4.1 mmol/L (ref 3.5–5.2)
Sodium: 140 mmol/L (ref 134–144)
Total Protein: 7.6 g/dL (ref 6.0–8.5)
eGFR: 55 mL/min/{1.73_m2} — ABNORMAL LOW (ref >59)

## 2020-08-13 LAB — MICROALBUMIN, URINE: Microalbumin, Urine: 3.8 ug/mL

## 2020-08-13 LAB — HEMOGLOBIN A1C
Est. average glucose Bld gHb Est-mCnc: 120 mg/dL
Hgb A1c MFr Bld: 5.8 % — ABNORMAL HIGH (ref 4.8–5.6)

## 2020-08-26 ENCOUNTER — Other Ambulatory Visit: Payer: Self-pay

## 2020-08-26 ENCOUNTER — Ambulatory Visit (INDEPENDENT_AMBULATORY_CARE_PROVIDER_SITE_OTHER): Payer: Medicare Other

## 2020-08-26 VITALS — BP 128/68 | HR 97 | Temp 98.3°F | Resp 16 | Ht 74.0 in | Wt 232.0 lb

## 2020-08-26 DIAGNOSIS — Z0000 Encounter for general adult medical examination without abnormal findings: Secondary | ICD-10-CM | POA: Diagnosis not present

## 2020-08-26 NOTE — Progress Notes (Signed)
Subjective:   Brandon Dillon. is a 87 y.o. male who presents for Medicare Annual/Subsequent preventive examination.  Review of Systems     Cardiac Risk Factors include: advanced age (>67men, >16 women);diabetes mellitus;hypertension;male gender;dyslipidemia;sedentary lifestyle     Objective:    Today's Vitals   08/26/20 1111  BP: 128/68  Pulse: 97  Resp: 16  Temp: 98.3 F (36.8 C)  TempSrc: Oral  SpO2: 97%  Weight: 232 lb (105.2 kg)  Height: 6\' 2"  (1.88 m)   Body mass index is 29.79 kg/m.  Advanced Directives 08/26/2020 07/21/2020 01/21/2020 10/18/2019 08/26/2019 08/19/2019 01/12/2019  Does Patient Have a Medical Advance Directive? Yes Yes No Yes Yes Yes No  Type of Paramedic of [place];Living will [place];Living will - - Healthcare Power of [place] -  Does patient want to make changes to medical advance directive? - No - Patient declined - - - No - Patient declined -  Copy of [place] in Chart? No - copy requested No - copy requested - - No - copy requested No - copy requested -    Current Medications (verified) Outpatient Encounter Medications as of 08/26/2020  Medication Sig  . aspirin 81 MG tablet Take 81 mg by mouth daily.  Marland Kitchen gabapentin (NEURONTIN) 100 MG capsule Take 1 capsule (100 mg total) by mouth 2 (two) times daily.  Marland Kitchen glucose blood (GE100 BLOOD GLUCOSE TEST) test strip 1 each by Other route daily. Use as instructed- Dx E11.9  . hydrochlorothiazide (HYDRODIURIL) 25 MG tablet Take 1 tablet (25 mg total) by mouth daily.  . Lancets (FREESTYLE) lancets AS DIRECTED  . megestrol (MEGACE) 20 MG tablet Take 1 tablet (20 mg total) by mouth 2 (two) times daily as needed (Hot flashes).  . simvastatin (ZOCOR) 20 MG tablet Take 1 tablet (20 mg total) by mouth every morning.   No facility-administered encounter medications on file as of 08/26/2020.    Allergies (verified) Lisinopril    History: Past Medical History:  Diagnosis Date  . Diabetes mellitus without complication ([place])   . Hearing loss   . Hx of fracture of wrist 07/20/2017   >30 years ago. <1989  . Hyperlipidemia   . Hypertension   . Prostate cancer [facility])    Past Surgical History:  Procedure Laterality Date  . CATARACT EXTRACTION W/PHACO Right 09/27/2017   Procedure: CATARACT EXTRACTION PHACO AND INTRAOCULAR LENS PLACEMENT ([place]) RIGHT;  Surgeon: Leandrew Koyanagi, MD;  Location: [place];  Service: Ophthalmology;  Laterality: Right;  IVA TOPICAL DIABETES  . CATARACT EXTRACTION W/PHACO Left 10/18/2017   Procedure: CATARACT EXTRACTION PHACO AND INTRAOCULAR LENS PLACEMENT ([place])  LEFT DIABETIC;  Surgeon: Leandrew Koyanagi, MD;  Location: [place];  Service: Ophthalmology;  Laterality: Left;  DIABETIC-ORAL MED  . COLONOSCOPY  2011   normal- Dr?   Family History  Family history unknown: Yes   Social History   Socioeconomic History  . Marital status: Married    Spouse name: Not on file  . Number of children: Not on file  . Years of education: Not on file  . Highest education level: Not on file  Occupational History  . Not on file  Tobacco Use  . Smoking status: Never Smoker  . Smokeless tobacco: Never Used  Vaping Use  . Vaping Use: Never used  Substance and Sexual Activity  . Alcohol use: No    Alcohol/week: 0.0 standard drinks  . Drug use: No  .  Sexual activity: Yes  Other Topics Concern  . Not on file  Social History Narrative   Pt lives with wife.    Social Determinants of Health   Financial Resource Strain: Low Risk   . Difficulty of Paying Living Expenses: Not hard at all  Food Insecurity: No Food Insecurity  . Worried About Charity fundraiser in the Last Year: Never true  . Ran Out of Food in the Last Year: Never true  Transportation Needs: No Transportation Needs  . Lack of Transportation (Medical): No  . Lack of Transportation (Non-Medical): No   Physical Activity: Inactive  . Days of Exercise per Week: 0 days  . Minutes of Exercise per Session: 0 min  Stress: No Stress Concern Present  . Feeling of Stress : Not at all  Social Connections: Moderately Integrated  . Frequency of Communication with Friends and Family: Three times a week  . Frequency of Social Gatherings with Friends and Family: Twice a week  . Attends Religious Services: More than 4 times per year  . Active Member of Clubs or Organizations: No  . Attends Archivist Meetings: Never  . Marital Status: Married    Tobacco Counseling Counseling given: Not Answered   Clinical Intake:  Pre-visit preparation completed: Yes  Pain : No/denies pain     BMI - recorded: 29.79 Nutritional Status: BMI 25 -29 Overweight Nutritional Risks: None Diabetes: Yes CBG done?: No Did pt. bring in CBG monitor from home?: No  How often do you need to have someone help you when you read instructions, pamphlets, or other written materials from your doctor or pharmacy?: 1 - Never  Nutrition Risk Assessment:  Has the patient had any N/V/D within the last 2 months?  No  Does the patient have any non-healing wounds?  No  Has the patient had any unintentional weight loss or weight gain?  No   Diabetes:  Is the patient diabetic?  Yes  If diabetic, was a CBG obtained today?  No  Did the patient bring in their glucometer from home?  No  How often do you monitor your CBG's? Every other day per patient.   Financial Strains and Diabetes Management:  Are you having any financial strains with the device, your supplies or your medication? No .  Does the patient want to be seen by Chronic Care Management for management of their diabetes?  No  Would the patient like to be referred to a Nutritionist or for Diabetic Management?  No   Diabetic Exams:  Diabetic Eye Exam: Completed 06/04/20 negative retinopathy.   Diabetic Foot Exam: Completed 08/12/20.   Interpreter  Needed?: No  Information entered by :: Clemetine Marker LPN   Activities of Daily Living In your present state of health, do you have any difficulty performing the following activities: 08/26/2020  Hearing? Y  Comment wears hearing aids  Vision? N  Walking or climbing stairs? Y  Dressing or bathing? N  Doing errands, shopping? N  Preparing Food and eating ? N  Using the Toilet? N  In the past six months, have you accidently leaked urine? N  Do you have problems with loss of bowel control? N  Managing your Medications? N  Managing your Finances? N  Housekeeping or managing your Housekeeping? N  Some recent data might be hidden    Patient Care Team: Juline Patch, MD as PCP - General (Family Medicine) Sindy Guadeloupe, MD as Consulting Physician (Hematology and Oncology) John Giovanni  C, MD (Urology) Gilda Crease, Latina Craver, MD (Vascular Surgery)  Indicate any recent Medical Services you may have received from other than [facility] providers in the past year (date may be approximate).     Assessment:   This is a routine wellness examination for [employer].  Hearing/Vision screen  Hearing Screening   125Hz  250Hz  500Hz  1000Hz  2000Hz  3000Hz  4000Hz  6000Hz  8000Hz   Right ear:           Left ear:           Comments: Pt wears hearing aids   Vision Screening Comments: Annual vision screenings done at [facility] [address]  Dietary issues and exercise activities discussed: Current Exercise Habits: The patient does not participate in regular exercise at present, Exercise limited by: orthopedic condition(s)  Goals Addressed            This Visit's Progress   . DIET - INCREASE WATER INTAKE   On track    Recommend drinking 6-8 glasses of water per day      Depression Screen PHQ 2/9 Scores 08/26/2020 08/12/2020 08/26/2019 05/17/2019 12/07/2018 10/17/2018 08/06/2018  PHQ - 2 Score 0 0 0 0 0 0 0  PHQ- 9 Score - 0 - 0 0 0 0    Fall Risk Fall Risk  08/26/2020 08/26/2019 05/17/2019 08/06/2018  04/04/2018  Falls in the past year? 0 1 0 0 0  Number falls in past yr: 0 1 - - -  Injury with Fall? 0 0 - - -  Risk for fall due to : Impaired balance/gait History of fall(s) - - -  Follow up Falls prevention discussed Falls prevention discussed Falls evaluation completed Falls evaluation completed Falls evaluation completed    FALL RISK PREVENTION PERTAINING TO THE HOME:  Any stairs in or around the home? Yes  If so, are there any without handrails? No  Home free of loose throw rugs in walkways, pet beds, electrical cords, etc? Yes  Adequate lighting in your home to reduce risk of falls? Yes   ASSISTIVE DEVICES UTILIZED TO PREVENT FALLS:  Life alert? No  Use of a cane, walker or w/c? Yes  Grab bars in the bathroom? Yes  Shower chair or bench in shower? Yes  Elevated toilet seat or a handicapped toilet? Yes   TIMED UP AND GO:  Was the test performed? Yes .  Length of time to ambulate 10 feet: 7 sec.   Gait steady and fast without use of assistive device  Cognitive Function:     6CIT Screen 08/26/2019  What Year? 0 points  What month? 0 points  What time? 0 points  Count back from 20 0 points  Months in reverse 0 points  Repeat phrase 6 points  Total Score 6    Immunizations Immunization History  Administered Date(s) Administered  . Fluad Quad(high Dose 65+) 01/15/2019, 01/29/2020  . Influenza,inj,Quad PF,6+ Mos 02/09/2017  . Influenza,inj,quad, With Preservative 01/30/2018  . Influenza-Unspecified 01/24/2015, 01/26/2018  . Moderna Sars-Covid-2 Vaccination 05/31/2019, 06/28/2019, 03/23/2020  . Pneumococcal Conjugate-13 11/02/2017  . Pneumococcal Polysaccharide-23 01/15/2019  . Tdap 05/14/2004  . Zoster 05/11/2015  . Zoster Recombinat (Shingrix) 08/02/2019    TDAP status: Due, Education has been provided regarding the importance of this vaccine. Advised may receive this vaccine at local pharmacy or Health Dept. Aware to provide a copy of the vaccination record  if obtained from local pharmacy or Health Dept. Verbalized acceptance and understanding.  Flu Vaccine status: Up to date  Pneumococcal vaccine status:  Up to date  Covid-19 vaccine status: Completed vaccines  Qualifies for Shingles Vaccine? Yes   Zostavax completed Yes   Shingrix Completed?: Yes  Screening Tests Health Maintenance  Topic Date Due  . COVID-19 Vaccine (4 - Booster for Moderna series) 09/20/2020  . INFLUENZA VACCINE  11/23/2020  . HEMOGLOBIN A1C  02/11/2021  . OPHTHALMOLOGY EXAM  06/04/2021  . FOOT EXAM  08/12/2021  . URINE MICROALBUMIN  08/12/2021  . PNA vac Low Risk Adult  Completed  . HPV VACCINES  Aged Out  . TETANUS/TDAP  Discontinued    Health Maintenance  There are no preventive care reminders to display for this patient.  Colorectal cancer screening: No longer required.   Lung Cancer Screening: (Low Dose CT Chest recommended if Age 44-80 years, 30 pack-year currently smoking OR have quit w/in 15years.) does not qualify.   Additional Screening:  Hepatitis C Screening: does not qualify.   Vision Screening: Recommended annual ophthalmology exams for early detection of glaucoma and other disorders of the eye. Is the patient up to date with their annual eye exam?  Yes  Who is the provider or what is the name of the office in which the patient attends annual eye exams? [place] Screening: Recommended annual dental exams for proper oral hygiene  Community Resource Referral / Chronic Care Management: CRR required this visit?  No   CCM required this visit?  No      Plan:     I have personally reviewed and noted the following in the patient's chart:   . Medical and social history . Use of alcohol, tobacco or illicit drugs  . Current medications and supplements including opioid prescriptions. Patient is not currently taking opioid prescriptions. . Functional ability and status . Nutritional status . Physical  activity . Advanced directives . List of other physicians . Hospitalizations, surgeries, and ER visits in previous 12 months . Vitals . Screenings to include cognitive, depression, and falls . Referrals and appointments  In addition, I have reviewed and discussed with patient certain preventive protocols, quality metrics, and best practice recommendations. A written personalized care plan for preventive services as well as general preventive health recommendations were provided to patient.     Clemetine Marker, LPN   08/27/979   Nurse Notes: none

## 2020-08-26 NOTE — Patient Instructions (Signed)
Brandon Dillon , Thank you for taking time to come for your Medicare Wellness Visit. I appreciate your ongoing commitment to your health goals. Please review the following plan we discussed and let me know if I can assist you in the future.   Screening recommendations/referrals: Colonoscopy: no longer required Recommended yearly ophthalmology/optometry visit for glaucoma screening and checkup Recommended yearly dental visit for hygiene and checkup  Vaccinations: Influenza vaccine: done 01/29/20 Pneumococcal vaccine: done 01/15/19 Tdap vaccine: due Shingles vaccine: done 08/02/19; will contact [facility] for additional vaccine date   Covid-19: done 05/31/19, 06/28/19 & 03/23/20  Advanced directives: Please bring a copy of your health care power of attorney and living will to the office at your convenience.  Conditions/risks identified: Recommend drinking 6-8 glasses of water per day   Next appointment: Follow up in one year for your annual wellness visit.   Preventive Care 75 Years and Older, Male Preventive care refers to lifestyle choices and visits with your health care provider that can promote health and wellness. What does preventive care include?  A yearly physical exam. This is also called an annual well check.  Dental exams once or twice a year.  Routine eye exams. Ask your health care provider how often you should have your eyes checked.  Personal lifestyle choices, including:  Daily care of your teeth and gums.  Regular physical activity.  Eating a healthy diet.  Avoiding tobacco and drug use.  Limiting alcohol use.  Practicing safe sex.  Taking low doses of aspirin every day.  Taking vitamin and mineral supplements as recommended by your health care provider. What happens during an annual well check? The services and screenings done by your health care provider during your annual well check will depend on your age, overall health, lifestyle risk factors, and  family history of disease. Counseling  Your health care provider may ask you questions about your:  Alcohol use.  Tobacco use.  Drug use.  Emotional well-being.  Home and relationship well-being.  Sexual activity.  Eating habits.  History of falls.  Memory and ability to understand (cognition).  Work and work Statistician. Screening  You may have the following tests or measurements:  Height, weight, and BMI.  Blood pressure.  Lipid and cholesterol levels. These may be checked every 5 years, or more frequently if you are over 82 years old.  Skin check.  Lung cancer screening. You may have this screening every year starting at age 45 if you have a 30-pack-year history of smoking and currently smoke or have quit within the past 15 years.  Fecal occult blood test (FOBT) of the stool. You may have this test every year starting at age 78.  Flexible sigmoidoscopy or colonoscopy. You may have a sigmoidoscopy every 5 years or a colonoscopy every 10 years starting at age 70.  Prostate cancer screening. Recommendations will vary depending on your family history and other risks.  Hepatitis C blood test.  Hepatitis B blood test.  Sexually transmitted disease (STD) testing.  Diabetes screening. This is done by checking your blood sugar (glucose) after you have not eaten for a while (fasting). You may have this done every 1-3 years.  Abdominal aortic aneurysm (AAA) screening. You may need this if you are a current or former smoker.  Osteoporosis. You may be screened starting at age 72 if you are at high risk. Talk with your health care provider about your test results, treatment options, and if necessary, the need for more  tests. Vaccines  Your health care provider may recommend certain vaccines, such as:  Influenza vaccine. This is recommended every year.  Tetanus, diphtheria, and acellular pertussis (Tdap, Td) vaccine. You may need a Td booster every 10 years.  Zoster  vaccine. You may need this after age 82.  Pneumococcal 13-valent conjugate (PCV13) vaccine. One dose is recommended after age 26.  Pneumococcal polysaccharide (PPSV23) vaccine. One dose is recommended after age 35. Talk to your health care provider about which screenings and vaccines you need and how often you need them. This information is not intended to replace advice given to you by your health care provider. Make sure you discuss any questions you have with your health care provider. Document Released: 05/08/2015 Document Revised: 12/30/2015 Document Reviewed: 02/10/2015 Elsevier Interactive Patient Education  2017 [place] Prevention in the Home Falls can cause injuries. They can happen to people of all ages. There are many things you can do to make your home safe and to help prevent falls. What can I do on the outside of my home?  Regularly fix the edges of walkways and driveways and fix any cracks.  Remove anything that might make you trip as you walk through a door, such as a raised step or threshold.  Trim any bushes or trees on the path to your home.  Use bright outdoor lighting.  Clear any walking paths of anything that might make someone trip, such as rocks or tools.  Regularly check to see if handrails are loose or broken. Make sure that both sides of any steps have handrails.  Any raised decks and porches should have guardrails on the edges.  Have any leaves, snow, or ice cleared regularly.  Use sand or salt on walking paths during winter.  Clean up any spills in your garage right away. This includes oil or grease spills. What can I do in the bathroom?  Use night lights.  Install grab bars by the toilet and in the tub and shower. Do not use towel bars as grab bars.  Use non-skid mats or decals in the tub or shower.  If you need to sit down in the shower, use a plastic, non-slip stool.  Keep the floor dry. Clean up any water that spills on the  floor as soon as it happens.  Remove soap buildup in the tub or shower regularly.  Attach bath mats securely with double-sided non-slip rug tape.  Do not have throw rugs and other things on the floor that can make you trip. What can I do in the bedroom?  Use night lights.  Make sure that you have a light by your bed that is easy to reach.  Do not use any sheets or blankets that are too big for your bed. They should not hang down onto the floor.  Have a firm chair that has side arms. You can use this for support while you get dressed.  Do not have throw rugs and other things on the floor that can make you trip. What can I do in the kitchen?  Clean up any spills right away.  Avoid walking on wet floors.  Keep items that you use a lot in easy-to-reach places.  If you need to reach something above you, use a strong step stool that has a grab bar.  Keep electrical cords out of the way.  Do not use floor polish or wax that makes floors slippery. If you must use wax, use non-skid floor  wax.  Do not have throw rugs and other things on the floor that can make you trip. What can I do with my stairs?  Do not leave any items on the stairs.  Make sure that there are handrails on both sides of the stairs and use them. Fix handrails that are broken or loose. Make sure that handrails are as long as the stairways.  Check any carpeting to make sure that it is firmly attached to the stairs. Fix any carpet that is loose or worn.  Avoid having throw rugs at the top or bottom of the stairs. If you do have throw rugs, attach them to the floor with carpet tape.  Make sure that you have a light switch at the top of the stairs and the bottom of the stairs. If you do not have them, ask someone to add them for you. What else can I do to help prevent falls?  Wear shoes that:  Do not have high heels.  Have rubber bottoms.  Are comfortable and fit you well.  Are closed at the toe. Do not wear  sandals.  If you use a stepladder:  Make sure that it is fully opened. Do not climb a closed stepladder.  Make sure that both sides of the stepladder are locked into place.  Ask someone to hold it for you, if possible.  Clearly mark and make sure that you can see:  Any grab bars or handrails.  First and last steps.  Where the edge of each step is.  Use tools that help you move around (mobility aids) if they are needed. These include:  Canes.  Walkers.  Scooters.  Crutches.  Turn on the lights when you go into a dark area. Replace any light bulbs as soon as they burn out.  Set up your furniture so you have a clear path. Avoid moving your furniture around.  If any of your floors are uneven, fix them.  If there are any pets around you, be aware of where they are.  Review your medicines with your doctor. Some medicines can make you feel dizzy. This can increase your chance of falling. Ask your doctor what other things that you can do to help prevent falls. This information is not intended to replace advice given to you by your health care provider. Make sure you discuss any questions you have with your health care provider. Document Released: 02/05/2009 Document Revised: 09/17/2015 Document Reviewed: 05/16/2014 Elsevier Interactive Patient Education  2017 [employer].

## 2020-10-21 ENCOUNTER — Other Ambulatory Visit: Payer: Self-pay

## 2020-10-21 ENCOUNTER — Inpatient Hospital Stay: Payer: Medicare Other | Attending: Oncology

## 2020-10-21 DIAGNOSIS — C61 Malignant neoplasm of prostate: Secondary | ICD-10-CM | POA: Diagnosis present

## 2020-10-22 LAB — PSA: Prostatic Specific Antigen: 1.65 ng/mL (ref 0.00–4.00)

## 2020-12-14 ENCOUNTER — Encounter: Payer: Self-pay | Admitting: Family Medicine

## 2020-12-14 ENCOUNTER — Other Ambulatory Visit: Payer: Self-pay

## 2020-12-14 ENCOUNTER — Ambulatory Visit (INDEPENDENT_AMBULATORY_CARE_PROVIDER_SITE_OTHER): Payer: Medicare Other | Admitting: Family Medicine

## 2020-12-14 VITALS — BP 124/62 | HR 88 | Ht 71.0 in | Wt 230.0 lb

## 2020-12-14 DIAGNOSIS — R739 Hyperglycemia, unspecified: Secondary | ICD-10-CM | POA: Diagnosis not present

## 2020-12-14 DIAGNOSIS — R748 Abnormal levels of other serum enzymes: Secondary | ICD-10-CM | POA: Diagnosis not present

## 2020-12-14 DIAGNOSIS — R413 Other amnesia: Secondary | ICD-10-CM

## 2020-12-14 DIAGNOSIS — I739 Peripheral vascular disease, unspecified: Secondary | ICD-10-CM

## 2020-12-14 DIAGNOSIS — G609 Hereditary and idiopathic neuropathy, unspecified: Secondary | ICD-10-CM

## 2020-12-14 DIAGNOSIS — E7801 Familial hypercholesterolemia: Secondary | ICD-10-CM | POA: Diagnosis not present

## 2020-12-14 DIAGNOSIS — I10 Essential (primary) hypertension: Secondary | ICD-10-CM | POA: Diagnosis not present

## 2020-12-14 DIAGNOSIS — R7303 Prediabetes: Secondary | ICD-10-CM | POA: Diagnosis not present

## 2020-12-14 MED ORDER — GABAPENTIN 100 MG PO CAPS: 100.0000 mg | ORAL_CAPSULE | Freq: Two times a day (BID) | ORAL | 5 refills | Status: DC

## 2020-12-14 MED ORDER — DONEPEZIL HCL 5 MG PO TABS: 5.0000 mg | ORAL_TABLET | Freq: Every day | ORAL | 2 refills | Status: DC

## 2020-12-14 MED ORDER — SIMVASTATIN 20 MG PO TABS: 20.0000 mg | ORAL_TABLET | Freq: Every morning | ORAL | 5 refills | Status: DC

## 2020-12-14 MED ORDER — HYDROCHLOROTHIAZIDE 25 MG PO TABS: 25.0000 mg | ORAL_TABLET | Freq: Every day | ORAL | 5 refills | Status: DC

## 2020-12-14 NOTE — Progress Notes (Signed)
Date:  12/14/2020   Name:  Brandon Dillon.   DOB:  [birth year]   MRN:  [id number]   Chief Complaint: Hypertension, Hyperlipidemia, and Peripheral Neuropathy  Hypertension This is a chronic problem. The current episode started more than 1 year ago. The problem has been gradually improving since onset. The problem is controlled. Pertinent negatives include no anxiety, blurred vision, chest pain, headaches, malaise/fatigue, neck pain, orthopnea, palpitations, peripheral edema, PND, shortness of breath or sweats. There are no associated agents to hypertension. Risk factors for coronary artery disease include dyslipidemia. Past treatments include diuretics. The current treatment provides moderate improvement. There are no compliance problems.  There is no history of angina, kidney disease, CAD/MI, CVA, heart failure, left ventricular hypertrophy, PVD or retinopathy. There is no history of chronic renal disease, a hypertension causing med or renovascular disease.  Hyperlipidemia This is a chronic problem. The current episode started more than 1 year ago. The problem is controlled. Recent lipid tests were reviewed and are normal. He has no history of chronic renal disease. Pertinent negatives include no chest pain, focal sensory loss, focal weakness, leg pain, myalgias or shortness of breath. Current antihyperlipidemic treatment includes statins. The current treatment provides mild improvement of lipids. There are no compliance problems.  Risk factors for coronary artery disease include dyslipidemia and hypertension.  Neurologic Problem The patient's pertinent negatives include no altered mental status, clumsiness, focal sensory loss, focal weakness, loss of balance, memory loss, near-syncope, slurred speech, syncope, visual change or weakness. This is a new problem. The current episode started more than 1 year ago. The problem has been waxing and waning since onset. Pertinent negatives include no  abdominal pain, back pain, chest pain, dizziness, fever, headaches, nausea, neck pain, palpitations or shortness of breath. The treatment provided mild relief.   Lab Results  Component Value Date   CREATININE 1.24 08/12/2020   BUN 17 08/12/2020   NA 140 08/12/2020   K 4.1 08/12/2020   CL 101 08/12/2020   CO2 22 08/12/2020   Lab Results  Component Value Date   CHOL 159 11/04/2019   HDL 47 11/04/2019   LDLCALC 99 11/04/2019   TRIG 68 11/04/2019   CHOLHDL 3.2 04/04/2018   No results found for: TSH Lab Results  Component Value Date   HGBA1C 5.8 (H) 08/12/2020   Lab Results  Component Value Date   WBC 5.7 07/21/2020   HGB 12.9 (L) 07/21/2020   HCT 39.4 07/21/2020   MCV 94.7 07/21/2020   PLT 309 07/21/2020   Lab Results  Component Value Date   ALT 11 08/12/2020   AST 11 08/12/2020   ALKPHOS 149 (H) 08/12/2020   BILITOT 0.6 08/12/2020     Review of Systems  Constitutional:  Negative for chills, fever and malaise/fatigue.  HENT:  Negative for drooling, ear discharge, ear pain and sore throat.   Eyes:  Negative for blurred vision.  Respiratory:  Negative for cough, shortness of breath and wheezing.   Cardiovascular:  Negative for chest pain, palpitations, orthopnea, leg swelling, PND and near-syncope.  Gastrointestinal:  Negative for abdominal pain, blood in stool, constipation, diarrhea and nausea.  Endocrine: Negative for polydipsia.  Genitourinary:  Negative for dysuria, frequency, hematuria and urgency.  Musculoskeletal:  Negative for back pain, myalgias and neck pain.  Skin:  Negative for rash.  Allergic/Immunologic: Negative for environmental allergies.  Neurological:  Negative for dizziness, focal weakness, syncope, weakness, headaches and loss of balance.  Hematological:  Does not bruise/bleed easily.  Psychiatric/Behavioral:  Negative for memory loss and suicidal ideas. The patient is not nervous/anxious.    Patient Active Problem List   Diagnosis Date Noted    Idiopathic peripheral neuropathy 08/12/2020   Prediabetes 08/12/2020   Goals of care, counseling/discussion 08/21/2019   Leg pain 08/16/2018   PAD (peripheral artery disease) ([place]) 08/16/2018   DJD (degenerative joint disease) 08/16/2018   Essential hypertension 05/11/2015   Primary prostate adenocarcinoma ([place]) 05/11/2015   Hyperlipidemia 05/11/2015   Prostate hypertrophy 05/11/2015   Taking multiple medications for chronic disease 05/11/2015   Benign prostatic hyperplasia with urinary obstruction 12/07/2014   Elevated prostate specific antigen (PSA) 12/04/2014    Allergies  Allergen Reactions   Lisinopril     coughing coughing    Past Surgical History:  Procedure Laterality Date   CATARACT EXTRACTION W/PHACO Right 09/27/2017   Procedure: CATARACT EXTRACTION PHACO AND INTRAOCULAR LENS PLACEMENT ([place]) RIGHT;  Surgeon: Leandrew Koyanagi, MD;  Location: [facility];  Service: Ophthalmology;  Laterality: Right;  IVA TOPICAL DIABETES   CATARACT EXTRACTION W/PHACO Left 10/18/2017   Procedure: CATARACT EXTRACTION PHACO AND INTRAOCULAR LENS PLACEMENT ([place])  LEFT DIABETIC;  Surgeon: Leandrew Koyanagi, MD;  Location: [facility];  Service: Ophthalmology;  Laterality: Left;  DIABETIC-ORAL MED   COLONOSCOPY  2011   normal- Dr?    Social History   Tobacco Use   Smoking status: Never   Smokeless tobacco: Never  Vaping Use   Vaping Use: Never used  Substance Use Topics   Alcohol use: No    Alcohol/week: 0.0 standard drinks   Drug use: No     Medication list has been reviewed and updated.  No outpatient medications have been marked as taking for the 12/14/20 encounter (Office Visit) with Juline Patch, MD.    [facility] 2/9 Scores 08/26/2020 08/12/2020 08/26/2019 05/17/2019  PHQ - 2 Score 0 0 0 0  PHQ- 9 Score - 0 - 0    GAD 7 : Generalized Anxiety Score 08/12/2020 05/17/2019  Nervous, Anxious, on Edge 0 0  Control/stop worrying 0 0  Worry too much - different  things 0 0  Trouble relaxing 0 0  Restless 0 0  Easily annoyed or irritable 0 0  Afraid - awful might happen 0 0  Total GAD 7 Score 0 0    BP Readings from Last 3 Encounters:  08/26/20 128/68  08/12/20 122/62  08/06/20 (!) 146/69    Physical Exam Vitals and nursing note reviewed.  HENT:     Head: Normocephalic.     Right Ear: Tympanic membrane and external ear normal.     Left Ear: Tympanic membrane and external ear normal.     Nose: Nose normal.  Eyes:     General: No scleral icterus.       Right eye: No discharge.        Left eye: No discharge.     Conjunctiva/sclera: Conjunctivae normal.     Pupils: Pupils are equal, round, and reactive to light.  Neck:     Thyroid: No thyromegaly.     Vascular: No JVD.     Trachea: No tracheal deviation.  Cardiovascular:     Rate and Rhythm: Normal rate and regular rhythm.     Heart sounds: Normal heart sounds. No murmur heard.   No friction rub. No gallop.  Pulmonary:     Effort: No respiratory distress.     Breath sounds: Normal breath sounds. No wheezing or rales.  Abdominal:  General: Bowel sounds are normal.     Palpations: Abdomen is soft. There is no mass.     Tenderness: There is no abdominal tenderness. There is no guarding or rebound.  Musculoskeletal:        General: No tenderness. Normal range of motion.     Cervical back: Normal range of motion and neck supple.  Lymphadenopathy:     Cervical: No cervical adenopathy.  Skin:    General: Skin is warm.     Findings: No rash.  Neurological:     Mental Status: He is alert and oriented to person, place, and time.     Cranial Nerves: No cranial nerve deficit.     Deep Tendon Reflexes: Reflexes are normal and symmetric.    Wt Readings from Last 3 Encounters:  12/14/20 230 lb (104.3 kg)  08/26/20 232 lb (105.2 kg)  08/12/20 236 lb (107 kg)    Ht _0  (1.803 m)   Wt 230 lb (104.3 kg)   BMI 32.08 kg/m   Assessment and Plan:  1. Essential  hypertension Chronic.  Controlled.  Stable.  Uncomplicated.  Continue hydrochlorothiazide 25 mg once a day.  Recheck 6 months. - hydrochlorothiazide (HYDRODIURIL) 25 MG tablet; Take 1 tablet (25 mg total) by mouth daily.  Dispense: 30 tablet; Refill: 5  2. Familial hypercholesterolemia Chronic.  Controlled.  Stable.  Continue simvastatin 20 mg once a day. - simvastatin (ZOCOR) 20 MG tablet; Take 1 tablet (20 mg total) by mouth every morning.  Dispense: 30 tablet; Refill: 5  3. Idiopathic peripheral neuropathy Chronic.  Controlled.  Stable.  Patient is asymptomatic and is controlled on gabapentin 100 mg twice a day. - gabapentin (NEURONTIN) 100 MG capsule; Take 1 capsule (100 mg total) by mouth 2 (two) times daily.  Dispense: 60 capsule; Refill: 5  4. PAD (peripheral artery disease) (H[facility]) Chronic.  Controlled.  Stable.  Controlled by addressing hypertension and hyperlipidemia which is in good control.  We will continue present regimen. - hydrochlorothiazide (HYDRODIURIL) 25 MG tablet; Take 1 tablet (25 mg total) by mouth daily.  Dispense: 30 tablet; Refill: 5 - simvastatin (ZOCOR) 20 MG tablet; Take 1 tablet (20 mg total) by mouth every morning.  Dispense: 30 tablet; Refill: 5  5. Mild memory disturbance Relatively new onset in the past year patient has fair memory recall but some details are forgotten.  Mild memory disturbances noted by family we will initiate Aricept 5 mg and recheck patient in 6 months..  6. Prediabetes .  Controlled.  Stable.  Uncomplicated.  Continue dietary control and recheck in 6 months.  7. Elevated alkaline phosphatase level Elevated alkaline phosphatase but it will be rechecked with alk phos evaluation which was noted to be improved but apparently elevated obtain an ALT/AST and recheck in 6 months. - Alkaline phosphatase  8. Hyperglycemia Chronic.  Controlled.  Stable on present dietary approach.  A1c in acceptable prediabetic range. - HgB A1c

## 2020-12-15 ENCOUNTER — Encounter: Payer: Self-pay | Admitting: Family Medicine

## 2020-12-15 LAB — HEMOGLOBIN A1C
Est. average glucose Bld gHb Est-mCnc: 120 mg/dL
Hgb A1c MFr Bld: 5.8 % — ABNORMAL HIGH (ref 4.8–5.6)

## 2020-12-15 LAB — ALKALINE PHOSPHATASE: Alkaline Phosphatase: 128 IU/L — ABNORMAL HIGH (ref 44–121)

## 2021-01-19 DIAGNOSIS — H0015 Chalazion left lower eyelid: Secondary | ICD-10-CM | POA: Diagnosis not present

## 2021-01-21 ENCOUNTER — Inpatient Hospital Stay (HOSPITAL_BASED_OUTPATIENT_CLINIC_OR_DEPARTMENT_OTHER): Payer: Medicare Other | Admitting: Oncology

## 2021-01-21 ENCOUNTER — Inpatient Hospital Stay: Payer: Medicare Other | Attending: Oncology

## 2021-01-21 ENCOUNTER — Encounter: Payer: Self-pay | Admitting: Oncology

## 2021-01-21 VITALS — BP 170/65 | HR 70 | Temp 96.6°F | Resp 20 | Wt 230.1 lb

## 2021-01-21 DIAGNOSIS — D649 Anemia, unspecified: Secondary | ICD-10-CM | POA: Insufficient documentation

## 2021-01-21 DIAGNOSIS — G8929 Other chronic pain: Secondary | ICD-10-CM | POA: Diagnosis not present

## 2021-01-21 DIAGNOSIS — C61 Malignant neoplasm of prostate: Secondary | ICD-10-CM

## 2021-01-21 DIAGNOSIS — C7951 Secondary malignant neoplasm of bone: Secondary | ICD-10-CM | POA: Insufficient documentation

## 2021-01-21 DIAGNOSIS — M545 Low back pain, unspecified: Secondary | ICD-10-CM | POA: Diagnosis not present

## 2021-01-21 LAB — CBC WITH DIFFERENTIAL/PLATELET
Abs Immature Granulocytes: 0.02 10*3/uL (ref 0.00–0.07)
Basophils Absolute: 0.1 10*3/uL (ref 0.0–0.1)
Basophils Relative: 2 %
Eosinophils Absolute: 0.2 10*3/uL (ref 0.0–0.5)
Eosinophils Relative: 3 %
HCT: 39.6 % (ref 39.0–52.0)
Hemoglobin: 12.5 g/dL — ABNORMAL LOW (ref 13.0–17.0)
Immature Granulocytes: 0 %
Lymphocytes Relative: 37 %
Lymphs Abs: 2.1 10*3/uL (ref 0.7–4.0)
MCH: 29.7 pg (ref 26.0–34.0)
MCHC: 31.6 g/dL (ref 30.0–36.0)
MCV: 94.1 fL (ref 80.0–100.0)
Monocytes Absolute: 0.5 10*3/uL (ref 0.1–1.0)
Monocytes Relative: 9 %
Neutro Abs: 2.7 10*3/uL (ref 1.7–7.7)
Neutrophils Relative %: 49 %
Platelets: 318 10*3/uL (ref 150–400)
RBC: 4.21 MIL/uL — ABNORMAL LOW (ref 4.22–5.81)
RDW: 12.7 % (ref 11.5–15.5)
WBC: 5.6 10*3/uL (ref 4.0–10.5)
nRBC: 0 % (ref 0.0–0.2)

## 2021-01-21 LAB — COMPREHENSIVE METABOLIC PANEL
ALT: 10 U/L (ref 0–44)
AST: 15 U/L (ref 15–41)
Albumin: 4.3 g/dL (ref 3.5–5.0)
Alkaline Phosphatase: 114 U/L (ref 38–126)
Anion gap: 7 (ref 5–15)
BUN: 16 mg/dL (ref 8–23)
CO2: 28 mmol/L (ref 22–32)
Calcium: 9.3 mg/dL (ref 8.9–10.3)
Chloride: 104 mmol/L (ref 98–111)
Creatinine, Ser: 1.28 mg/dL — ABNORMAL HIGH (ref 0.61–1.24)
GFR, Estimated: 53 mL/min — ABNORMAL LOW (ref >60)
Glucose, Bld: 121 mg/dL — ABNORMAL HIGH (ref 70–99)
Potassium: 4.1 mmol/L (ref 3.5–5.1)
Sodium: 139 mmol/L (ref 135–145)
Total Bilirubin: 0.6 mg/dL (ref 0.3–1.2)
Total Protein: 7.7 g/dL (ref 6.5–8.1)

## 2021-01-21 NOTE — Progress Notes (Signed)
Patient states no concerns at the moment. 

## 2021-01-21 NOTE — Progress Notes (Signed)
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  At that time he had staging evaluation consistent with metastatic disease to the sacrum and pelvis and was started on Lupron every 6 months.  Last bone scan done on March 2019 again showed metastatic involvement of the right acetabulum and iliac wing and right sacrum.  Patient has been responding to Lupron well and his PSA went down from 6.6 in 20 16-0.5 in 2019.  It went up to 1.2 on 08/13/2019.  Patient has therefore been referred to [place] for consideration of additional treatments for prostate cancer patient is doing well for his age and remains independent of his ADLs.  Interval history-patient reports doing well.  He continues to have hot flashes-1/week.  He is not taking his Megace.  Reports some chronic low back pain that is tolerable. Takes aspirin.  Reports staying active and spends most of his time outside.  He has his next Eligard injection next month.  ECOG PS- 1 Pain scale- 0   Review of systems- Review of Systems  Constitutional:  Positive for malaise/fatigue. Negative for chills, fever and weight loss.  HENT:  Negative for congestion, ear pain and tinnitus.   Eyes: Negative.  Negative for blurred vision and double vision.  Respiratory: Negative.  Negative for cough, sputum production  and shortness of breath.   Cardiovascular: Negative.  Negative for chest pain, palpitations and leg swelling.  Gastrointestinal: Negative.  Negative for abdominal pain, constipation, diarrhea, nausea and vomiting.  Genitourinary:  Negative for dysuria, frequency and urgency.  Musculoskeletal:  Positive for back pain. Negative for falls.  Skin: Negative.  Negative for rash.  Neurological: Negative.  Negative for weakness and headaches.  Endo/Heme/Allergies: Negative.  Does not bruise/bleed easily.       Hot flashes  Psychiatric/Behavioral: Negative.  Negative for depression. The patient is not nervous/anxious and does not have insomnia.      Allergies  Allergen Reactions   Lisinopril     coughing coughing     Past Medical History:  Diagnosis Date   Diabetes mellitus without complication ([place])    Hearing loss    Hx of fracture of wrist 07/20/2017   >30 years ago. <1989   Hyperlipidemia    Hypertension    Prostate cancer [facility])      Past Surgical History:  Procedure Laterality Date   CATARACT EXTRACTION W/PHACO Right 09/27/2017   Procedure: CATARACT EXTRACTION PHACO AND INTRAOCULAR LENS PLACEMENT ([place]) RIGHT;  Surgeon: Leandrew Koyanagi, MD;  Location: [place];  Service: Ophthalmology;  Laterality: Right;  IVA TOPICAL DIABETES   CATARACT EXTRACTION W/PHACO Left 10/18/2017   Procedure: CATARACT EXTRACTION PHACO AND INTRAOCULAR LENS PLACEMENT ([place])  LEFT DIABETIC;  Surgeon: Leandrew Koyanagi, MD;  Location: [place];  Service: Ophthalmology;  Laterality: Left;  DIABETIC-ORAL MED   COLONOSCOPY  2011   normal- Dr?    Social History   Socioeconomic  History   Marital status: Married    Spouse name: Not on file   Number of children: Not on file   Years of education: Not on file   Highest education level: Not on file  Occupational History   Not on file  Tobacco Use   Smoking status: Never   Smokeless tobacco: Never  Vaping Use   Vaping Use: Never  used  Substance and Sexual Activity   Alcohol use: No    Alcohol/week: 0.0 standard drinks   Drug use: No   Sexual activity: Yes  Other Topics Concern   Not on file  Social History Narrative   Pt lives with wife.    Social Determinants of Health   Financial Resource Strain: Low Risk    Difficulty of Paying Living Expenses: Not hard at all  Food Insecurity: No Food Insecurity   Worried About Charity fundraiser in the Last Year: Never true   [place] in the Last Year: Never true  Transportation Needs: No Transportation Needs   Lack of Transportation (Medical): No   Lack of Transportation (Non-Medical): No  Physical Activity: Inactive   Days of Exercise per Week: 0 days   Minutes of Exercise per Session: 0 min  Stress: No Stress Concern Present   Feeling of Stress : Not at all  Social Connections: Moderately Integrated   Frequency of Communication with Friends and Family: Three times a week   Frequency of Social Gatherings with Friends and Family: Twice a week   Attends Religious Services: More than 4 times per year   Active Member of [employer] or Organizations: No   Attends Music therapist: Never   Marital Status: Married  Human resources officer Violence: Not At Risk   Fear of Current or Ex-Partner: No   Emotionally Abused: No   Physically Abused: No   Sexually Abused: No    Family History  Family history unknown: Yes     Current Outpatient Medications:    aspirin 81 MG tablet, Take 81 mg by mouth daily., Disp: , Rfl:    donepezil (ARICEPT) 5 MG tablet, Take 1 tablet (5 mg total) by mouth at bedtime., Disp: 30 tablet, Rfl: 2   gabapentin (NEURONTIN) 100 MG capsule, Take 1 capsule (100 mg total) by mouth 2 (two) times daily., Disp: 60 capsule, Rfl: 5   glucose blood (GE100 BLOOD GLUCOSE TEST) test strip, 1 each by Other route daily. Use as instructed- Dx E11.9, Disp: 100 each, Rfl: 1   hydrochlorothiazide (HYDRODIURIL) 25 MG tablet, Take 1 tablet (25 mg  total) by mouth daily., Disp: 30 tablet, Rfl: 5   Lancets (FREESTYLE) lancets, AS DIRECTED, Disp: 100 each, Rfl: 0   megestrol (MEGACE) 20 MG tablet, Take 1 tablet (20 mg total) by mouth 2 (two) times daily as needed (Hot flashes)., Disp: 60 tablet, Rfl: 1   simvastatin (ZOCOR) 20 MG tablet, Take 1 tablet (20 mg total) by mouth every morning., Disp: 30 tablet, Rfl: 5  Physical exam:  Vitals:   01/21/21 1109  BP: (!) 170/65  Pulse: 70  Resp: 20  Temp: (!) 96.6 F (35.9 C)  SpO2: 99%  Weight: 230 lb 1.6 oz (104.4 kg)   Physical Exam Constitutional:      Appearance: Normal appearance.  HENT:     Head: Normocephalic and atraumatic.  Eyes:     Pupils: Pupils are equal, round, and reactive to light.  Cardiovascular:     Rate and Rhythm: Normal rate  and regular rhythm.     Heart sounds: Normal heart sounds. No murmur heard. Pulmonary:     Effort: Pulmonary effort is normal.     Breath sounds: Normal breath sounds. No wheezing.  Abdominal:     General: Bowel sounds are normal. There is no distension.     Palpations: Abdomen is soft.     Tenderness: There is no abdominal tenderness.  Musculoskeletal:        General: Normal range of motion.     Cervical back: Normal range of motion.  Skin:    General: Skin is warm and dry.     Findings: No rash.  Neurological:     Mental Status: He is alert and oriented to person, place, and time.  Psychiatric:        Judgment: Judgment normal.     CMP Latest Ref Rng & Units 01/21/2021  Glucose 70 - 99 mg/dL 121(H)  BUN 8 - 23 mg/dL 16  Creatinine 0.61 - 1.24 mg/dL 1.28(H)  Sodium 135 - 145 mmol/L 139  Potassium 3.5 - 5.1 mmol/L 4.1  Chloride 98 - 111 mmol/L 104  CO2 22 - 32 mmol/L 28  Calcium 8.9 - 10.3 mg/dL 9.3  Total Protein 6.5 - 8.1 g/dL 7.7  Total Bilirubin 0.3 - 1.2 mg/dL 0.6  Alkaline Phos 38 - 126 U/L 114  AST 15 - 41 U/L 15  ALT 0 - 44 U/L 10   CBC Latest Ref Rng & Units 01/21/2021  WBC 4.0 - 10.5 K/uL 5.6  Hemoglobin 13.0  - 17.0 g/dL 12.5(L)  Hematocrit 39.0 - 52.0 % 39.6  Platelets 150 - 400 K/uL 318    No images are attached to the encounter.  No results found.   Assessment and plan- Patient is a 87 y.o. 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  He is here for a routine follow-up visit.  Clinically he is doing well.  He continues to see urology for his Lupron injections.  His PSA had increased some in March to 1.79 but decreased to 1.65 in June.  Labs from today are stable. Plan is to continue every 92-month monitoring with lab work (CBC, CMP) and to see Dr. Janese Banks.  Normocytic anemia- Baseline hemoglobin is between 12 and 13 and has been that way for quite some time.  Hemoglobin today is 12.5 which is stable.  Low back pain- Patient states this is chronic.  Typically resolves with rest.  Occasionally takes aspirin.  I spent 20 minutes dedicated to the care of this patient (face-to-face and non-face-to-face) on the date of the encounter to include what is described in the assessment and plan.  Visit Diagnosis 1. Primary prostate adenocarcinoma ([place])    Faythe Casa, NP 01/21/2021 11:56 AM

## 2021-01-22 LAB — TESTOSTERONE: Testosterone: 6 ng/dL — ABNORMAL LOW (ref 264–916)

## 2021-02-01 ENCOUNTER — Telehealth: Payer: Self-pay

## 2021-02-01 NOTE — Telephone Encounter (Signed)
PA submitted via [facility] for Eligard, pending review.

## 2021-02-02 ENCOUNTER — Ambulatory Visit: Payer: Self-pay

## 2021-02-02 NOTE — Telephone Encounter (Signed)
Update on [employer] portal stating status is still pending and has been sent to Clinical review. Clinical notes, pathology, labs and scans have been requested to be faxed to [phone]. Nurse case manager Jose Persia phone# [phone]  Patient's wife was notified of this status update, appointment today was cancelled. Will call to reschedule injection appointment once approval is received.

## 2021-02-03 NOTE — Telephone Encounter (Signed)
Medication approved from 02-01-21 through 02-01-22, approval #[id number]. Called and left patient a message to schedule injection appointment for Eligard.

## 2021-02-10 NOTE — Telephone Encounter (Signed)
Pt's wife called office to schedule Eliguard appt at 11:30 on Monday.  Pt has appt in [place] at 10 and will come here after.

## 2021-02-10 NOTE — Telephone Encounter (Signed)
Attempted to contacted patient again on multiple numbers listed no answer on all numbers, able to leave another voicemail on cell number. Detailed message left requesting patient to call the office and schedule Eligard injection

## 2021-02-15 ENCOUNTER — Ambulatory Visit (INDEPENDENT_AMBULATORY_CARE_PROVIDER_SITE_OTHER): Payer: Medicare Other | Admitting: Family Medicine

## 2021-02-15 ENCOUNTER — Ambulatory Visit: Payer: Medicare Other

## 2021-02-15 ENCOUNTER — Encounter: Payer: Self-pay | Admitting: Family Medicine

## 2021-02-15 ENCOUNTER — Other Ambulatory Visit: Payer: Self-pay

## 2021-02-15 VITALS — BP 120/80 | HR 78 | Ht 71.0 in | Wt 227.0 lb

## 2021-02-15 DIAGNOSIS — R413 Other amnesia: Secondary | ICD-10-CM

## 2021-02-15 MED ORDER — DONEPEZIL HCL 5 MG PO TABS: 5.0000 mg | ORAL_TABLET | Freq: Every day | ORAL | 5 refills | Status: DC

## 2021-02-15 NOTE — Progress Notes (Signed)
Date:  02/15/2021   Name:  Brandon Dillon.   DOB:  09-Jul-1927   MRN:  [id number]   Chief Complaint: Follow-up (On starting Aricept)  Neurologic Problem The patient's primary symptoms include memory loss. The patient's pertinent negatives include no altered mental status, clumsiness, focal sensory loss, focal weakness, loss of balance, near-syncope, slurred speech, syncope or visual change. This is a chronic problem. The neurological problem developed gradually. The problem has been gradually improving (on aricept) since onset. Pertinent negatives include no abdominal pain, back pain, chest pain, dizziness, fever, headaches, nausea, neck pain, palpitations or shortness of breath. (none) Treatments tried: aricept. The treatment provided mild relief.   Lab Results  Component Value Date   CREATININE 1.28 (H) 01/21/2021   BUN 16 01/21/2021   NA 139 01/21/2021   K 4.1 01/21/2021   CL 104 01/21/2021   CO2 28 01/21/2021   Lab Results  Component Value Date   CHOL 159 11/04/2019   HDL 47 11/04/2019   LDLCALC 99 11/04/2019   TRIG 68 11/04/2019   CHOLHDL 3.2 04/04/2018   No results found for: TSH Lab Results  Component Value Date   HGBA1C 5.8 (H) 12/14/2020   Lab Results  Component Value Date   WBC 5.6 01/21/2021   HGB 12.5 (L) 01/21/2021   HCT 39.6 01/21/2021   MCV 94.1 01/21/2021   PLT 318 01/21/2021   Lab Results  Component Value Date   ALT 10 01/21/2021   AST 15 01/21/2021   ALKPHOS 114 01/21/2021   BILITOT 0.6 01/21/2021     Review of Systems  Constitutional:  Negative for chills and fever.  HENT:  Negative for drooling, ear discharge, ear pain and sore throat.   Respiratory:  Negative for cough, shortness of breath and wheezing.   Cardiovascular:  Negative for chest pain, palpitations, leg swelling and near-syncope.  Gastrointestinal:  Negative for abdominal pain, blood in stool, constipation, diarrhea and nausea.  Endocrine: Negative for polydipsia.   Genitourinary:  Negative for dysuria, frequency, hematuria and urgency.  Musculoskeletal:  Negative for back pain, myalgias and neck pain.  Skin:  Negative for rash.  Allergic/Immunologic: Negative for environmental allergies.  Neurological:  Negative for dizziness, focal weakness, syncope, headaches and loss of balance.  Hematological:  Does not bruise/bleed easily.  Psychiatric/Behavioral:  Positive for memory loss. Negative for suicidal ideas. The patient is not nervous/anxious.    Patient Active Problem List   Diagnosis Date Noted   Idiopathic peripheral neuropathy 08/12/2020   Prediabetes 08/12/2020   Goals of care, counseling/discussion 08/21/2019   Leg pain 08/16/2018   PAD (peripheral artery disease) ([place]) 08/16/2018   DJD (degenerative joint disease) 08/16/2018   Essential hypertension 05/11/2015   Primary prostate adenocarcinoma ([place]) 05/11/2015   Hyperlipidemia 05/11/2015   Prostate hypertrophy 05/11/2015   Taking multiple medications for chronic disease 05/11/2015   Benign prostatic hyperplasia with urinary obstruction 12/07/2014   Elevated prostate specific antigen (PSA) 12/04/2014    Allergies  Allergen Reactions   Lisinopril     coughing coughing    Past Surgical History:  Procedure Laterality Date   CATARACT EXTRACTION W/PHACO Right 09/27/2017   Procedure: CATARACT EXTRACTION PHACO AND INTRAOCULAR LENS PLACEMENT ([place]) RIGHT;  Surgeon: Leandrew Koyanagi, MD;  Location: [place];  Service: Ophthalmology;  Laterality: Right;  IVA TOPICAL DIABETES   CATARACT EXTRACTION W/PHACO Left 10/18/2017   Procedure: CATARACT EXTRACTION PHACO AND INTRAOCULAR LENS PLACEMENT ([place])  LEFT DIABETIC;  Surgeon: Leandrew Koyanagi, MD;  Location:  [place];  Service: Ophthalmology;  Laterality: Left;  DIABETIC-ORAL MED   COLONOSCOPY  2011   normal- Dr?    Social History   Tobacco Use   Smoking status: Never   Smokeless tobacco: Never  Vaping Use    Vaping Use: Never used  Substance Use Topics   Alcohol use: No    Alcohol/week: 0.0 standard drinks   Drug use: No     Medication list has been reviewed and updated.  Current Meds  Medication Sig   aspirin 81 MG tablet Take 81 mg by mouth daily.   donepezil (ARICEPT) 5 MG tablet Take 1 tablet (5 mg total) by mouth at bedtime.   gabapentin (NEURONTIN) 100 MG capsule Take 1 capsule (100 mg total) by mouth 2 (two) times daily.   glucose blood (GE100 BLOOD GLUCOSE TEST) test strip 1 each by Other route daily. Use as instructed- Dx E11.9   hydrochlorothiazide (HYDRODIURIL) 25 MG tablet Take 1 tablet (25 mg total) by mouth daily.   Lancets (FREESTYLE) lancets AS DIRECTED   megestrol (MEGACE) 20 MG tablet Take 1 tablet (20 mg total) by mouth 2 (two) times daily as needed (Hot flashes).   simvastatin (ZOCOR) 20 MG tablet Take 1 tablet (20 mg total) by mouth every morning.    PHQ 2/9 Scores 02/15/2021 08/26/2020 08/12/2020 08/26/2019  PHQ - 2 Score 0 0 0 0  PHQ- 9 Score 0 - 0 -    GAD 7 : Generalized Anxiety Score 02/15/2021 08/12/2020 05/17/2019  Nervous, Anxious, on Edge 0 0 0  Control/stop worrying 0 0 0  Worry too much - different things 0 0 0  Trouble relaxing 0 0 0  Restless 0 0 0  Easily annoyed or irritable 0 0 0  Afraid - awful might happen 0 0 0  Total GAD 7 Score 0 0 0    BP Readings from Last 3 Encounters:  02/15/21 120/80  01/21/21 (!) 170/65  12/14/20 124/62    Physical Exam Vitals and nursing note reviewed.  HENT:     Head: Normocephalic.     Right Ear: Tympanic membrane, ear canal and external ear normal. There is no impacted cerumen.     Left Ear: Tympanic membrane, ear canal and external ear normal.     Nose: Nose normal. No congestion or rhinorrhea.  Eyes:     General: No scleral icterus.       Right eye: No discharge.        Left eye: No discharge.     Conjunctiva/sclera: Conjunctivae normal.     Pupils: Pupils are equal, round, and reactive to light.   Neck:     Thyroid: No thyromegaly.     Vascular: No JVD.     Trachea: No tracheal deviation.  Cardiovascular:     Rate and Rhythm: Normal rate and regular rhythm.     Heart sounds: Normal heart sounds. No murmur heard.   No friction rub. No gallop.  Pulmonary:     Effort: No respiratory distress.     Breath sounds: Normal breath sounds. No stridor. No wheezing, rhonchi or rales.  Abdominal:     General: Bowel sounds are normal.     Palpations: Abdomen is soft. There is no mass.     Tenderness: There is no abdominal tenderness. There is no guarding or rebound.  Musculoskeletal:        General: No tenderness. Normal range of motion.     Cervical back: Normal range of motion  and neck supple.  Lymphadenopathy:     Cervical: No cervical adenopathy.  Skin:    General: Skin is warm.     Findings: No rash.  Neurological:     Mental Status: He is alert and oriented to person, place, and time.     Cranial Nerves: No cranial nerve deficit.     Deep Tendon Reflexes: Reflexes are normal and symmetric.    Wt Readings from Last 3 Encounters:  02/15/21 227 lb (103 kg)  01/21/21 230 lb 1.6 oz (104.4 kg)  12/14/20 230 lb (104.3 kg)    BP 120/80   Pulse 78   Ht 5\' 11"  (1.803 m)   Wt 227 lb (103 kg)   BMI 31.66 kg/m   Assessment and Plan:  1. Mild memory disturbance Relatively new onset.  Stable.  Recently started on Aricept 5 mg daily.  Patient tolerating medication well and feels as if it may be helping him some.  He states that he can remember things that he might have to pick up at the store or tasks that people ask him to do.  We will recheck in 6 months. - donepezil (ARICEPT) 5 MG tablet; Take 1 tablet (5 mg total) by mouth at bedtime.  Dispense: 30 tablet; Refill: 5

## 2021-02-16 ENCOUNTER — Ambulatory Visit (INDEPENDENT_AMBULATORY_CARE_PROVIDER_SITE_OTHER): Payer: Medicare Other | Admitting: *Deleted

## 2021-02-16 DIAGNOSIS — C61 Malignant neoplasm of prostate: Secondary | ICD-10-CM

## 2021-02-16 MED ORDER — LEUPROLIDE ACETATE (6 MONTH) 45 MG ~~LOC~~ KIT
45.0000 mg | PACK | Freq: Once | SUBCUTANEOUS | Status: AC
  Administered 2021-02-16: 45 mg via SUBCUTANEOUS

## 2021-02-16 NOTE — Progress Notes (Signed)
to Prostate Cancer patient is present today for a Eligard Injection.   Medication: Eligard 6 month Dose: 45 mg  Location: right buttock  Lot: [id number] Exp: 07/21/2022   Patient tolerated well, no complications were noted   Performed by: Gaspar Cola  CMA   Per Dr. Bernardo Heater patient is to continue therapy for Indefinately. Patient's next follow up was scheduled for 02/02/2021. This appointment was scheduled using wheel and given to patient today along with reminder continue on Vitamin D 800-1000iu and Calium 1000-1200mg  daily while on Androgen Deprivation Therapy.

## 2021-02-18 ENCOUNTER — Encounter (INDEPENDENT_AMBULATORY_CARE_PROVIDER_SITE_OTHER): Payer: Self-pay | Admitting: Vascular Surgery

## 2021-02-18 ENCOUNTER — Ambulatory Visit (INDEPENDENT_AMBULATORY_CARE_PROVIDER_SITE_OTHER): Payer: Medicare Other | Admitting: Vascular Surgery

## 2021-02-18 ENCOUNTER — Ambulatory Visit (INDEPENDENT_AMBULATORY_CARE_PROVIDER_SITE_OTHER): Payer: Medicare Other

## 2021-02-18 ENCOUNTER — Other Ambulatory Visit: Payer: Self-pay

## 2021-02-18 VITALS — BP 149/73 | HR 89 | Ht 74.0 in | Wt 225.0 lb

## 2021-02-18 DIAGNOSIS — E782 Mixed hyperlipidemia: Secondary | ICD-10-CM

## 2021-02-18 DIAGNOSIS — I10 Essential (primary) hypertension: Secondary | ICD-10-CM

## 2021-02-18 DIAGNOSIS — M159 Polyosteoarthritis, unspecified: Secondary | ICD-10-CM | POA: Diagnosis not present

## 2021-02-18 DIAGNOSIS — I739 Peripheral vascular disease, unspecified: Secondary | ICD-10-CM

## 2021-02-18 NOTE — Progress Notes (Signed)
MRN : [id number]  Brandon Dillon. is a 87 y.o. (08-Feb-1928) male who presents with chief complaint of check circulation.  History of Present Illness:   The patient returns to the office for followup and review of the noninvasive studies. There have been no interval changes in lower extremity symptoms. No interval shortening of the patient's claudication distance or development of rest pain symptoms. No new ulcers or wounds have occurred since the last visit.   There have been no significant changes to the patient's overall health care.   The patient denies amaurosis fugax or recent TIA symptoms. There are no recent neurological changes noted. The patient denies history of DVT, PE or superficial thrombophlebitis. The patient denies recent episodes of angina or shortness of breath.    ABI Rt=0.86 and Lt=0.94 (previous ABI's Rt=0.70 and Lt=1.01 )  Current Meds  Medication Sig   aspirin 81 MG tablet Take 81 mg by mouth daily.   donepezil (ARICEPT) 5 MG tablet Take 1 tablet (5 mg total) by mouth at bedtime.   gabapentin (NEURONTIN) 100 MG capsule Take 1 capsule (100 mg total) by mouth 2 (two) times daily.   glucose blood (GE100 BLOOD GLUCOSE TEST) test strip 1 each by Other route daily. Use as instructed- Dx E11.9   hydrochlorothiazide (HYDRODIURIL) 25 MG tablet Take 1 tablet (25 mg total) by mouth daily.   Lancets (FREESTYLE) lancets AS DIRECTED   megestrol (MEGACE) 20 MG tablet Take 1 tablet (20 mg total) by mouth 2 (two) times daily as needed (Hot flashes).   simvastatin (ZOCOR) 20 MG tablet Take 1 tablet (20 mg total) by mouth every morning.    Past Medical History:  Diagnosis Date   Diabetes mellitus without complication ([place])    Hearing loss    Hx of fracture of wrist 07/20/2017   >30 years ago. <1989   Hyperlipidemia    Hypertension    Prostate cancer [facility])     Past Surgical History:  Procedure Laterality Date   CATARACT EXTRACTION W/PHACO Right 09/27/2017    Procedure: CATARACT EXTRACTION PHACO AND INTRAOCULAR LENS PLACEMENT ([place]) RIGHT;  Surgeon: Leandrew Koyanagi, MD;  Location: [place];  Service: Ophthalmology;  Laterality: Right;  IVA TOPICAL DIABETES   CATARACT EXTRACTION W/PHACO Left 10/18/2017   Procedure: CATARACT EXTRACTION PHACO AND INTRAOCULAR LENS PLACEMENT ([place])  LEFT DIABETIC;  Surgeon: Leandrew Koyanagi, MD;  Location: [place];  Service: Ophthalmology;  Laterality: Left;  DIABETIC-ORAL MED   COLONOSCOPY  2011   normal- Dr?    Social History Social History   Tobacco Use   Smoking status: Never   Smokeless tobacco: Never  Vaping Use   Vaping Use: Never used  Substance Use Topics   Alcohol use: No    Alcohol/week: 0.0 standard drinks   Drug use: No    Family History Family History  Family history unknown: Yes    Allergies  Allergen Reactions   Lisinopril     coughing coughing     REVIEW OF SYSTEMS (Negative unless checked)  Constitutional: [] Weight loss  [] Fever  [] Chills Cardiac: [] Chest pain   [] Chest pressure   [] Palpitations   [] Shortness of breath when laying flat   [] Shortness of breath with exertion. Vascular:  [x] Pain in legs with walking   [] Pain in legs at rest  [] History of DVT   [] Phlebitis   [] Swelling in legs   [] Varicose veins   [] Non-healing ulcers Pulmonary:   [] Uses home oxygen   [] Productive cough   []   Hemoptysis   [] Wheeze  [] COPD   [] Asthma Neurologic:  [] Dizziness   [] Seizures   [] History of stroke   [] History of TIA  [] Aphasia   [] Vissual changes   [] Weakness or numbness in arm   [] Weakness or numbness in leg Musculoskeletal:   [] Joint swelling   [x] Joint pain   [] Low back pain Hematologic:  [] Easy bruising  [] Easy bleeding   [] Hypercoagulable state   [] Anemic Gastrointestinal:  [] Diarrhea   [] Vomiting  [] Gastroesophageal reflux/heartburn   [] Difficulty swallowing. Genitourinary:  [] Chronic kidney disease   [] Difficult urination  [] Frequent urination   [] Blood in  urine Skin:  [] Rashes   [] Ulcers  Psychological:  [] History of anxiety   []  History of major depression.  Physical Examination  Vitals:   02/18/21 1015  BP: (!) 149/73  Pulse: 89  Weight: 225 lb (102.1 kg)  Height: 6\' 2"  (1.88 m)   Body mass index is 28.89 kg/m. Gen: WD/WN, NAD Head: [place]/AT, No temporalis wasting.  Ear/Nose/Throat: Hearing grossly intact, nares w/o erythema or drainage Eyes: PER, EOMI, sclera nonicteric.  Neck: Supple, no masses.  No bruit or JVD.  Pulmonary:  Good air movement, no audible wheezing, no use of accessory muscles.  Cardiac: RRR, normal S1, S2, no Murmurs. Vascular:   Vessel Right Left  Radial Palpable Palpable  PT Not Palpable Not Palpable  DP Not Palpable Not Palpable  Gastrointestinal: soft, non-distended. No guarding/no peritoneal signs.  Musculoskeletal: M/S 5/5 throughout.  No visible deformity.  Neurologic: CN 2-12 intact. Pain and light touch intact in extremities.  Symmetrical.  Speech is fluent. Motor exam as listed above. Psychiatric: Judgment intact, Mood & affect appropriate for pt's clinical situation. Dermatologic: No rashes or ulcers noted.  No changes consistent with cellulitis.   CBC Lab Results  Component Value Date   WBC 5.6 01/21/2021   HGB 12.5 (L) 01/21/2021   HCT 39.6 01/21/2021   MCV 94.1 01/21/2021   PLT 318 01/21/2021    BMET    Component Value Date/Time   NA 139 01/21/2021 1056   NA 140 08/12/2020 1138   K 4.1 01/21/2021 1056   CL 104 01/21/2021 1056   CO2 28 01/21/2021 1056   GLUCOSE 121 (H) 01/21/2021 1056   BUN 16 01/21/2021 1056   BUN 17 08/12/2020 1138   CREATININE 1.28 (H) 01/21/2021 1056   CALCIUM 9.3 01/21/2021 1056   GFRNONAA 53 (L) 01/21/2021 1056   GFRAA 55 (L) 01/21/2020 0814   CrCl cannot be calculated (Patient's most recent lab result is older than the maximum 21 days allowed.).  COAG No results found for: INR, PROTIME  Radiology VAS [place] ABI WITH/WO TBI  Result Date: 02/18/2021   LOWER EXTREMITY DOPPLER STUDY Patient Name:  Brandon Dillon.  Date of Exam:   02/18/2021 Medical Rec #: [id number]          Accession #:    [id number] Date of Birth: June 07, 1927         Patient Gender: M Patient Age:   45 years Exam Location:  [facility] Procedure:      VAS [place] ABI WITH/WO TBI Referring Phys: [facility] --------------------------------------------------------------------------------  Indications: Right leg pain.  Comparison Study: 02/20/2020 Performing Technologist: Charlane Ferretti RT (R)(VS)  Examination Guidelines: A complete evaluation includes at minimum, Doppler waveform signals and systolic blood pressure reading at the level of bilateral brachial, anterior tibial, and posterior tibial arteries, when vessel segments are accessible. Bilateral testing is considered an integral part of a complete examination.  Photoelectric Plethysmograph (PPG) waveforms and toe systolic pressure readings are included as required and additional duplex testing as needed. Limited examinations for reoccurring indications may be performed as noted.  ABI Findings: +---------+------------------+-----+----------+--------+ Right    Rt Pressure (mmHg)IndexWaveform  Comment  +---------+------------------+-----+----------+--------+ Brachial 198                                       +---------+------------------+-----+----------+--------+ ATA      178                    biphasic           +---------+------------------+-----+----------+--------+ PTA      166               0.81 monophasic         +---------+------------------+-----+----------+--------+ Great Toe122               0.59 Abnormal           +---------+------------------+-----+----------+--------+ +---------+------------------+-----+---------+-------+ Left     Lt Pressure (mmHg)IndexWaveform Comment +---------+------------------+-----+---------+-------+ Brachial 206                                      +---------+------------------+-----+---------+-------+ ATA      193                    triphasic        +---------+------------------+-----+---------+-------+ PTA      181               0.88 biphasic         +---------+------------------+-----+---------+-------+ Great Toe129               0.63 Abnormal         +---------+------------------+-----+---------+-------+ +-------+-----------+-----------+------------+------------+ ABI/TBIToday's ABIToday's TBIPrevious ABIPrevious TBI +-------+-----------+-----------+------------+------------+ Right  .86        .59        .70         .92          +-------+-----------+-----------+------------+------------+ Left   .94        .63        1.01        1.02         +-------+-----------+-----------+------------+------------+ Right ABIs appear increased compared to prior study on 02/20/2020. Left ABIs appear decreased compared to prior study on 02/20/2020.  Summary: Right: Resting right ankle-brachial index indicates mild right lower extremity arterial disease. The right toe-brachial index is abnormal. Right TBI appears decreased as compared to the previous exam on 02/20/2020. Left: Resting left ankle-brachial index indicates mild left lower extremity arterial disease. The left toe-brachial index is abnormal. Left TBI appears decreased as compared to the previous exam on 02/20/2020.  *See table(s) above for measurements and observations.  Electronically signed by Hortencia Pilar MD on 02/18/2021 at 12:59:09 PM.    Final      Assessment/Plan 1. PAD (peripheral artery disease) (HCC) Recommend:   The patient has evidence of atherosclerosis of the lower extremities with claudication.  The patient does not voice lifestyle limiting changes at this point in time.   Noninvasive studies do not suggest clinically significant change.   No invasive studies, angiography or surgery at this time The patient should continue walking and begin a more  formal exercise program.  The patient should continue antiplatelet therapy and  aggressive treatment of the lipid abnormalities   No changes in the patient's medications at this time  - VAS [place] ABI WITH/WO TBI; Future  2. Essential hypertension Continue antihypertensive medications as already ordered, these medications have been reviewed and there are no changes at this time.   3. Mixed hyperlipidemia Continue statin as ordered and reviewed, no changes at this time   4. Primary osteoarthritis involving multiple joints Continue NSAID medications as already ordered, these medications have been reviewed and there are no changes at this time.  Continued activity and therapy was stressed.     Hortencia Pilar, MD  02/18/2021 7:33 PM

## 2021-03-26 DIAGNOSIS — H903 Sensorineural hearing loss, bilateral: Secondary | ICD-10-CM | POA: Diagnosis not present

## 2021-04-12 ENCOUNTER — Ambulatory Visit (INDEPENDENT_AMBULATORY_CARE_PROVIDER_SITE_OTHER): Payer: Medicare Other | Admitting: Family Medicine

## 2021-04-12 ENCOUNTER — Other Ambulatory Visit: Payer: Self-pay

## 2021-04-12 ENCOUNTER — Encounter: Payer: Self-pay | Admitting: Family Medicine

## 2021-04-12 VITALS — BP 110/80 | HR 60 | Ht 71.0 in | Wt 225.0 lb

## 2021-04-12 DIAGNOSIS — E7801 Familial hypercholesterolemia: Secondary | ICD-10-CM

## 2021-04-12 DIAGNOSIS — G609 Hereditary and idiopathic neuropathy, unspecified: Secondary | ICD-10-CM | POA: Diagnosis not present

## 2021-04-12 DIAGNOSIS — R7303 Prediabetes: Secondary | ICD-10-CM | POA: Diagnosis not present

## 2021-04-12 DIAGNOSIS — I10 Essential (primary) hypertension: Secondary | ICD-10-CM

## 2021-04-12 MED ORDER — GABAPENTIN 100 MG PO CAPS: 100.0000 mg | ORAL_CAPSULE | Freq: Two times a day (BID) | ORAL | 5 refills | Status: DC

## 2021-04-12 NOTE — Progress Notes (Signed)
Date:  04/12/2021   Name:  Brandon Dillon.   DOB:  04-16-1928   MRN:  [id number]   Chief Complaint: Prediabetes  Diabetes He presents for his follow-up diabetic visit. He has type 2 diabetes mellitus. His disease course has been stable. There are no hypoglycemic associated symptoms. Pertinent negatives for hypoglycemia include no dizziness, headaches or nervousness/anxiousness. There are no diabetic associated symptoms. Pertinent negatives for diabetes include no chest pain, no polydipsia, no visual change and no weakness. There are no hypoglycemic complications. Symptoms are stable. There are no diabetic complications. Risk factors for coronary artery disease include dyslipidemia and hypertension. Current diabetic treatment includes oral agent (monotherapy) and diet. He is following a generally healthy diet. Meal planning includes avoidance of concentrated sweets and carbohydrate counting. He participates in exercise intermittently. His home blood glucose trend is fluctuating minimally. His breakfast blood glucose is taken between 8-9 am. His breakfast blood glucose range is generally 140-180 mg/dl. An ACE inhibitor/angiotensin II receptor blocker is not being taken. He does not see a podiatrist.Eye exam is current.  Neurologic Problem The patient's pertinent negatives include no altered mental status, clumsiness, focal sensory loss, focal weakness, loss of balance, memory loss, near-syncope, slurred speech, syncope, visual change or weakness. Primary symptoms comment: neuropathy. The current episode started more than 1 year ago. The neurological problem developed gradually. The problem has been gradually improving since onset. Pertinent negatives include no abdominal pain, back pain, chest pain, dizziness, fever, headaches, nausea, neck pain, palpitations or shortness of breath. The treatment provided moderate relief.  Hypertension This is a chronic problem. The current episode started more than  1 year ago. The problem has been gradually improving since onset. The problem is controlled. Pertinent negatives include no chest pain, headaches, neck pain, palpitations or shortness of breath. Risk factors for coronary artery disease include dyslipidemia. Past treatments include diuretics.  Hyperlipidemia This is a chronic problem. The problem is controlled. Exacerbating diseases include diabetes. Factors aggravating his hyperlipidemia include thiazides. Pertinent negatives include no chest pain, focal sensory loss, focal weakness, myalgias or shortness of breath. Current antihyperlipidemic treatment includes statins.   Lab Results  Component Value Date   NA 139 01/21/2021   K 4.1 01/21/2021   CO2 28 01/21/2021   GLUCOSE 121 (H) 01/21/2021   BUN 16 01/21/2021   CREATININE 1.28 (H) 01/21/2021   CALCIUM 9.3 01/21/2021   EGFR 55 (L) 08/12/2020   GFRNONAA 53 (L) 01/21/2021   Lab Results  Component Value Date   CHOL 159 11/04/2019   HDL 47 11/04/2019   LDLCALC 99 11/04/2019   TRIG 68 11/04/2019   CHOLHDL 3.2 04/04/2018   No results found for: TSH Lab Results  Component Value Date   HGBA1C 5.8 (H) 12/14/2020   Lab Results  Component Value Date   WBC 5.6 01/21/2021   HGB 12.5 (L) 01/21/2021   HCT 39.6 01/21/2021   MCV 94.1 01/21/2021   PLT 318 01/21/2021   Lab Results  Component Value Date   ALT 10 01/21/2021   AST 15 01/21/2021   ALKPHOS 114 01/21/2021   BILITOT 0.6 01/21/2021   No results found for: 25OHVITD2, 25OHVITD3, VD25OH   Review of Systems  Constitutional:  Negative for chills and fever.  HENT:  Negative for drooling, ear discharge, ear pain and sore throat.   Respiratory:  Negative for cough, shortness of breath and wheezing.   Cardiovascular:  Negative for chest pain, palpitations, leg swelling and near-syncope.  Gastrointestinal:  Negative  for abdominal pain, blood in stool, constipation, diarrhea and nausea.  Endocrine: Negative for polydipsia.   Genitourinary:  Negative for dysuria, frequency, hematuria and urgency.  Musculoskeletal:  Negative for back pain, myalgias and neck pain.  Skin:  Negative for rash.  Allergic/Immunologic: Negative for environmental allergies.  Neurological:  Negative for dizziness, focal weakness, syncope, weakness, headaches and loss of balance.  Hematological:  Does not bruise/bleed easily.  Psychiatric/Behavioral:  Negative for memory loss and suicidal ideas. The patient is not nervous/anxious.    Patient Active Problem List   Diagnosis Date Noted   Idiopathic peripheral neuropathy 08/12/2020   Prediabetes 08/12/2020   Goals of care, counseling/discussion 08/21/2019   Leg pain 08/16/2018   PAD (peripheral artery disease) ([place]) 08/16/2018   DJD (degenerative joint disease) 08/16/2018   Essential hypertension 05/11/2015   Primary prostate adenocarcinoma ([place]) 05/11/2015   Hyperlipidemia 05/11/2015   Prostate hypertrophy 05/11/2015   Taking multiple medications for chronic disease 05/11/2015   Benign prostatic hyperplasia with urinary obstruction 12/07/2014   Elevated prostate specific antigen (PSA) 12/04/2014    Allergies  Allergen Reactions   Lisinopril     coughing coughing    Past Surgical History:  Procedure Laterality Date   CATARACT EXTRACTION W/PHACO Right 09/27/2017   Procedure: CATARACT EXTRACTION PHACO AND INTRAOCULAR LENS PLACEMENT ([place]) RIGHT;  Surgeon: Leandrew Koyanagi, MD;  Location: [place];  Service: Ophthalmology;  Laterality: Right;  IVA TOPICAL DIABETES   CATARACT EXTRACTION W/PHACO Left 10/18/2017   Procedure: CATARACT EXTRACTION PHACO AND INTRAOCULAR LENS PLACEMENT ([place])  LEFT DIABETIC;  Surgeon: Leandrew Koyanagi, MD;  Location: [place];  Service: Ophthalmology;  Laterality: Left;  DIABETIC-ORAL MED   COLONOSCOPY  2011   normal- Dr?    Social History   Tobacco Use   Smoking status: Never   Smokeless tobacco: Never  Vaping Use    Vaping Use: Never used  Substance Use Topics   Alcohol use: No    Alcohol/week: 0.0 standard drinks   Drug use: No     Medication list has been reviewed and updated.  Current Meds  Medication Sig   aspirin 81 MG tablet Take 81 mg by mouth daily.   donepezil (ARICEPT) 5 MG tablet Take 1 tablet (5 mg total) by mouth at bedtime.   gabapentin (NEURONTIN) 100 MG capsule Take 1 capsule (100 mg total) by mouth 2 (two) times daily.   glucose blood (GE100 BLOOD GLUCOSE TEST) test strip 1 each by Other route daily. Use as instructed- Dx E11.9   hydrochlorothiazide (HYDRODIURIL) 25 MG tablet Take 1 tablet (25 mg total) by mouth daily.   Lancets (FREESTYLE) lancets AS DIRECTED   megestrol (MEGACE) 20 MG tablet Take 1 tablet (20 mg total) by mouth 2 (two) times daily as needed (Hot flashes).   simvastatin (ZOCOR) 20 MG tablet Take 1 tablet (20 mg total) by mouth every morning.    PHQ 2/9 Scores 02/15/2021 08/26/2020 08/12/2020 08/26/2019  PHQ - 2 Score 0 0 0 0  PHQ- 9 Score 0 - 0 -    GAD 7 : Generalized Anxiety Score 02/15/2021 08/12/2020 05/17/2019  Nervous, Anxious, on Edge 0 0 0  Control/stop worrying 0 0 0  Worry too much - different things 0 0 0  Trouble relaxing 0 0 0  Restless 0 0 0  Easily annoyed or irritable 0 0 0  Afraid - awful might happen 0 0 0  Total GAD 7 Score 0 0 0    BP Readings from  Last 3 Encounters:  04/12/21 110/80  02/18/21 (!) 149/73  02/15/21 120/80    Physical Exam Vitals and nursing note reviewed.  HENT:     Head: Normocephalic.     Right Ear: Tympanic membrane and external ear normal.     Left Ear: Tympanic membrane and external ear normal.     Nose: Nose normal.  Eyes:     General: No scleral icterus.       Right eye: No discharge.        Left eye: No discharge.     Conjunctiva/sclera: Conjunctivae normal.     Pupils: Pupils are equal, round, and reactive to light.  Neck:     Thyroid: No thyromegaly.     Vascular: No carotid bruit or JVD.      Trachea: No tracheal deviation.  Cardiovascular:     Rate and Rhythm: Normal rate and regular rhythm.     Heart sounds: Normal heart sounds. No murmur heard.   No friction rub. No gallop.  Pulmonary:     Effort: No respiratory distress.     Breath sounds: Normal breath sounds. No wheezing, rhonchi or rales.  Chest:     Chest wall: No tenderness.  Abdominal:     General: Bowel sounds are normal.     Palpations: Abdomen is soft. There is no mass.     Tenderness: There is no abdominal tenderness. There is no guarding or rebound.  Musculoskeletal:        General: No tenderness. Normal range of motion.     Cervical back: Normal range of motion and neck supple.  Lymphadenopathy:     Cervical: No cervical adenopathy.  Skin:    General: Skin is warm.     Findings: No rash.  Neurological:     Mental Status: He is alert and oriented to person, place, and time.     Cranial Nerves: No cranial nerve deficit.     Deep Tendon Reflexes: Reflexes are normal and symmetric.    Wt Readings from Last 3 Encounters:  04/12/21 225 lb (102.1 kg)  02/18/21 225 lb (102.1 kg)  02/15/21 227 lb (103 kg)    BP 110/80    Pulse 60    Ht '5\' 11"'  (1.803 m)    Wt 225 lb (102.1 kg)    BMI 31.38 kg/m   Assessment and Plan:  1. Prediabetes Chronic.  Controlled.  Stable.  Currently controlled on diet.  We will check A1c for current status of control will check CMP for GFR and electrolytes. - HgB A1c - Comprehensive Metabolic Panel (CMET)  2. Essential hypertension Chronic.  Controlled.  Stable.  Blood pressure today is 110/80.  Will check CMP for electrolytes and GFR. - Comprehensive Metabolic Panel (CMET)  3. Familial hypercholesterolemia Chronic.  Controlled.  Stable.  Will check lipid panel for current level of LDL. - Lipid Panel With LDL/HDL Ratio  4. Idiopathic peripheral neuropathy Chronic.  Controlled.  Asymptomatic on medication.  We will continue gabapentin 100 mg twice a day as needed for  pain. - gabapentin (NEURONTIN) 100 MG capsule; Take 1 capsule (100 mg total) by mouth 2 (two) times daily.  Dispense: 60 capsule; Refill: 5

## 2021-04-13 LAB — COMPREHENSIVE METABOLIC PANEL
ALT: 8 IU/L (ref 0–44)
AST: 12 IU/L (ref 0–40)
Albumin/Globulin Ratio: 1.6 (ref 1.2–2.2)
Albumin: 4.5 g/dL (ref 3.5–4.6)
Alkaline Phosphatase: 137 IU/L — ABNORMAL HIGH (ref 44–121)
BUN/Creatinine Ratio: 12 (ref 10–24)
BUN: 18 mg/dL (ref 10–36)
Bilirubin Total: 0.6 mg/dL (ref 0.0–1.2)
CO2: 25 mmol/L (ref 20–29)
Calcium: 9.8 mg/dL (ref 8.6–10.2)
Chloride: 101 mmol/L (ref 96–106)
Creatinine, Ser: 1.49 mg/dL — ABNORMAL HIGH (ref 0.76–1.27)
Globulin, Total: 2.9 g/dL (ref 1.5–4.5)
Glucose: 151 mg/dL — ABNORMAL HIGH (ref 70–99)
Potassium: 4.8 mmol/L (ref 3.5–5.2)
Sodium: 140 mmol/L (ref 134–144)
Total Protein: 7.4 g/dL (ref 6.0–8.5)
eGFR: 44 mL/min/{1.73_m2} — ABNORMAL LOW (ref >59)

## 2021-04-13 LAB — HEMOGLOBIN A1C
Est. average glucose Bld gHb Est-mCnc: 105 mg/dL
Hgb A1c MFr Bld: 5.3 % (ref 4.8–5.6)

## 2021-04-13 LAB — LIPID PANEL WITH LDL/HDL RATIO
Cholesterol, Total: 196 mg/dL (ref 100–199)
HDL: 56 mg/dL (ref >39)
LDL Chol Calc (NIH): 122 mg/dL — ABNORMAL HIGH (ref 0–99)
LDL/HDL Ratio: 2.2 ratio (ref 0.0–3.6)
Triglycerides: 101 mg/dL (ref 0–149)
VLDL Cholesterol Cal: 18 mg/dL (ref 5–40)

## 2021-04-30 DIAGNOSIS — H02125 Mechanical ectropion of left lower eyelid: Secondary | ICD-10-CM | POA: Diagnosis not present

## 2021-06-11 LAB — HM DIABETES EYE EXAM

## 2021-06-17 DIAGNOSIS — H02125 Mechanical ectropion of left lower eyelid: Secondary | ICD-10-CM | POA: Diagnosis not present

## 2021-07-06 DIAGNOSIS — H02132 Senile ectropion of right lower eyelid: Secondary | ICD-10-CM | POA: Insufficient documentation

## 2021-07-06 DIAGNOSIS — H119 Unspecified disorder of conjunctiva: Secondary | ICD-10-CM | POA: Insufficient documentation

## 2021-07-06 DIAGNOSIS — H04562 Stenosis of left lacrimal punctum: Secondary | ICD-10-CM | POA: Insufficient documentation

## 2021-07-06 DIAGNOSIS — D2310 Other benign neoplasm of skin of unspecified eyelid, including canthus: Secondary | ICD-10-CM | POA: Insufficient documentation

## 2021-07-06 DIAGNOSIS — H16213 Exposure keratoconjunctivitis, bilateral: Secondary | ICD-10-CM | POA: Insufficient documentation

## 2021-07-12 ENCOUNTER — Other Ambulatory Visit: Payer: Self-pay | Admitting: *Deleted

## 2021-07-12 DIAGNOSIS — C61 Malignant neoplasm of prostate: Secondary | ICD-10-CM

## 2021-07-21 ENCOUNTER — Inpatient Hospital Stay (HOSPITAL_BASED_OUTPATIENT_CLINIC_OR_DEPARTMENT_OTHER): Payer: Medicare Other | Admitting: Oncology

## 2021-07-21 ENCOUNTER — Inpatient Hospital Stay: Payer: Medicare Other | Attending: Oncology

## 2021-07-21 ENCOUNTER — Encounter: Payer: Self-pay | Admitting: Oncology

## 2021-07-21 VITALS — BP 158/74 | HR 75 | Temp 98.5°F | Resp 18 | Wt 230.6 lb

## 2021-07-21 DIAGNOSIS — Z79899 Other long term (current) drug therapy: Secondary | ICD-10-CM | POA: Diagnosis not present

## 2021-07-21 DIAGNOSIS — C7951 Secondary malignant neoplasm of bone: Secondary | ICD-10-CM | POA: Diagnosis not present

## 2021-07-21 DIAGNOSIS — C61 Malignant neoplasm of prostate: Secondary | ICD-10-CM

## 2021-07-21 LAB — CBC WITH DIFFERENTIAL/PLATELET
Abs Immature Granulocytes: 0.02 10*3/uL (ref 0.00–0.07)
Basophils Absolute: 0.1 10*3/uL (ref 0.0–0.1)
Basophils Relative: 1 %
Eosinophils Absolute: 0.2 10*3/uL (ref 0.0–0.5)
Eosinophils Relative: 3 %
HCT: 39.5 % (ref 39.0–52.0)
Hemoglobin: 13 g/dL (ref 13.0–17.0)
Immature Granulocytes: 0 %
Lymphocytes Relative: 36 %
Lymphs Abs: 2 10*3/uL (ref 0.7–4.0)
MCH: 30.7 pg (ref 26.0–34.0)
MCHC: 32.9 g/dL (ref 30.0–36.0)
MCV: 93.4 fL (ref 80.0–100.0)
Monocytes Absolute: 0.5 10*3/uL (ref 0.1–1.0)
Monocytes Relative: 9 %
Neutro Abs: 2.9 10*3/uL (ref 1.7–7.7)
Neutrophils Relative %: 51 %
Platelets: 306 10*3/uL (ref 150–400)
RBC: 4.23 MIL/uL (ref 4.22–5.81)
RDW: 12.6 % (ref 11.5–15.5)
WBC: 5.6 10*3/uL (ref 4.0–10.5)
nRBC: 0 % (ref 0.0–0.2)

## 2021-07-21 LAB — COMPREHENSIVE METABOLIC PANEL
ALT: 11 U/L (ref 0–44)
AST: 15 U/L (ref 15–41)
Albumin: 4.4 g/dL (ref 3.5–5.0)
Alkaline Phosphatase: 125 U/L (ref 38–126)
Anion gap: 6 (ref 5–15)
BUN: 18 mg/dL (ref 8–23)
CO2: 27 mmol/L (ref 22–32)
Calcium: 9 mg/dL (ref 8.9–10.3)
Chloride: 102 mmol/L (ref 98–111)
Creatinine, Ser: 1.24 mg/dL (ref 0.61–1.24)
GFR, Estimated: 55 mL/min — ABNORMAL LOW (ref >60)
Glucose, Bld: 101 mg/dL — ABNORMAL HIGH (ref 70–99)
Potassium: 3.9 mmol/L (ref 3.5–5.1)
Sodium: 135 mmol/L (ref 135–145)
Total Bilirubin: 0.8 mg/dL (ref 0.3–1.2)
Total Protein: 7.6 g/dL (ref 6.5–8.1)

## 2021-07-21 LAB — PSA: Prostatic Specific Antigen: 2.32 ng/mL (ref 0.00–4.00)

## 2021-07-21 NOTE — Progress Notes (Signed)
? ? ? ?Hematology/Oncology Consult note ?[facility]  ?[phone] [phone] ? ?Patient Care Team: ?Juline Patch, MD as PCP - General (Family Medicine) ?Sindy Guadeloupe, MD as Consulting Physician (Hematology and Oncology) ?Stoioff, Ronda Fairly, MD (Urology) ?Schnier, Dolores Lory, MD (Vascular Surgery)  ? ?Name of the patient: Brandon Dillon  ?[id number]  ?January 20, 1928  ? ?Date of visit: 07/21/21 ? 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 ?Chief complaint/ Reason for visit-routine follow-up of prostate cancer ? ?Heme/Onc history: Patient is a 88 year old male who was diagnosed with adenocarcinoma of the prostate Gleason 3+ 26 January 2015.  At that time he had staging evaluation consistent with metastatic disease to the sacrum and pelvis and was started on Lupron every 6 months.  Last bone scan done on March 2019 again showed metastatic involvement of the right acetabulum and iliac wing and right sacrum.  Patient has been responding to Lupron well and his PSA went down from 6.6 in 20 16-0.5 in 2019.  It went up to 1.2 on 08/13/2019.  Patient has therefore been referred to [place] for consideration of additional treatments for prostate cancer patient is doing well for his age and remains independent of his ADLs.  ? ?He gets Lupron through urology. ? ?Interval history-reports tolerating Lupron well without any significant side effects.  He will be getting surgery for his lower lid ectropion soon. ? ?ECOG PS- 1 ?Pain scale- 0 ? ? ?Review of systems- Review of Systems  ?Constitutional:  Negative for chills, fever, malaise/fatigue and weight loss.  ?HENT:  Negative for congestion, ear discharge and nosebleeds.   ?Eyes:  Negative for blurred vision.  ?Respiratory:  Negative for cough, hemoptysis, sputum production, shortness of breath and wheezing.   ?Cardiovascular:  Negative for chest pain, palpitations, orthopnea and claudication.  ?Gastrointestinal:  Negative  for abdominal pain, blood in stool, constipation, diarrhea, heartburn, melena, nausea and vomiting.  ?Genitourinary:  Negative for dysuria, flank pain, frequency, hematuria and urgency.  ?Musculoskeletal:  Negative for back pain, joint pain and myalgias.  ?Skin:  Negative for rash.  ?Neurological:  Negative for dizziness, tingling, focal weakness, seizures, weakness and headaches.  ?Endo/Heme/Allergies:  Does not bruise/bleed easily.  ?Psychiatric/Behavioral:  Negative for depression and suicidal ideas. The patient does not have insomnia.    ? ? ? ?Allergies  ?Allergen Reactions  ? Lisinopril   ?  coughing ?coughing  ? ? ? ?Past Medical History:  ?Diagnosis Date  ? Diabetes mellitus without complication ([place])   ? Hearing loss   ? Hx of fracture of wrist 07/20/2017  ? >30 years ago. <1989  ? Hyperlipidemia   ? Hypertension   ? Prostate cancer ([place])   ? ? ? ?Past Surgical History:  ?Procedure Laterality Date  ? CATARACT EXTRACTION W/PHACO Right 09/27/2017  ? Procedure: CATARACT EXTRACTION PHACO AND INTRAOCULAR LENS PLACEMENT ([place]) RIGHT;  Surgeon: Leandrew Koyanagi, MD;  Location: [facility];  Service: Ophthalmology;  Laterality: Right;  IVA TOPICAL ?DIABETES  ? CATARACT EXTRACTION W/PHACO Left 10/18/2017  ? Procedure: CATARACT EXTRACTION PHACO AND INTRAOCULAR LENS PLACEMENT ([place])  LEFT DIABETIC;  Surgeon: Leandrew Koyanagi, MD;  Location: [facility];  Service: Ophthalmology;  Laterality: Left;  DIABETIC-ORAL MED  ? COLONOSCOPY  2011  ? normal- Dr?  ? ? ?Social History  ? ?Socioeconomic History  ? Marital status: Married  ?  Spouse name: Not on file  ? Number of children: Not on file  ? Years of education: Not on  file  ? Highest education level: Not on file  ?Occupational History  ? Not on file  ?Tobacco Use  ? Smoking status: Never  ? Smokeless tobacco: Never  ?Vaping Use  ? Vaping Use: Never used  ?Substance and Sexual Activity  ? Alcohol use: No  ?  Alcohol/week: 0.0 standard drinks  ? Drug  use: No  ? Sexual activity: Yes  ?Other Topics Concern  ? Not on file  ?Social History Narrative  ? Pt lives with wife.   ? ?Social Determinants of Health  ? ?Financial Resource Strain: Low Risk   ? Difficulty of Paying Living Expenses: Not hard at all  ?Food Insecurity: No Food Insecurity  ? Worried About Charity fundraiser in the Last Year: Never true  ? Ran Out of Food in the Last Year: Never true  ?Transportation Needs: No Transportation Needs  ? Lack of Transportation (Medical): No  ? Lack of Transportation (Non-Medical): No  ?Physical Activity: Inactive  ? Days of Exercise per Week: 0 days  ? Minutes of Exercise per Session: 0 min  ?Stress: No Stress Concern Present  ? Feeling of Stress : Not at all  ?Social Connections: Moderately Integrated  ? Frequency of Communication with Friends and Family: Three times a week  ? Frequency of Social Gatherings with Friends and Family: Twice a week  ? Attends Religious Services: More than 4 times per year  ? Active Member of Clubs or Organizations: No  ? Attends Archivist Meetings: Never  ? Marital Status: Married  ?Intimate Partner Violence: Not At Risk  ? Fear of Current or Ex-Partner: No  ? Emotionally Abused: No  ? Physically Abused: No  ? Sexually Abused: No  ? ? ?Family History  ?Family history unknown: Yes  ? ? ? ?Current Outpatient Medications:  ?  aspirin 81 MG tablet, Take 81 mg by mouth daily., Disp: , Rfl:  ?  donepezil (ARICEPT) 5 MG tablet, Take 1 tablet (5 mg total) by mouth at bedtime., Disp: 30 tablet, Rfl: 5 ?  gabapentin (NEURONTIN) 100 MG capsule, Take 1 capsule (100 mg total) by mouth 2 (two) times daily., Disp: 60 capsule, Rfl: 5 ?  glucose blood (GE100 BLOOD GLUCOSE TEST) test strip, 1 each by Other route daily. Use as instructed- Dx E11.9, Disp: 100 each, Rfl: 1 ?  hydrochlorothiazide (HYDRODIURIL) 25 MG tablet, Take 1 tablet (25 mg total) by mouth daily., Disp: 30 tablet, Rfl: 5 ?  Lancets (FREESTYLE) lancets, AS DIRECTED, Disp: 100  each, Rfl: 0 ?  megestrol (MEGACE) 20 MG tablet, Take 1 tablet (20 mg total) by mouth 2 (two) times daily as needed (Hot flashes)., Disp: 60 tablet, Rfl: 1 ?  simvastatin (ZOCOR) 20 MG tablet, Take 1 tablet (20 mg total) by mouth every morning., Disp: 30 tablet, Rfl: 5 ? ?Physical exam:  ?Vitals:  ? 07/21/21 1447  ?BP: (!) 158/74  ?Pulse: 75  ?Resp: 18  ?Temp: 98.5 ?F (36.9 ?C)  ?SpO2: 100%  ?Weight: 230 lb 9.6 oz (104.6 kg)  ? ?Physical Exam ?Constitutional:   ?   General: He is not in acute distress. ?Cardiovascular:  ?   Rate and Rhythm: Normal rate and regular rhythm.  ?   Heart sounds: Normal heart sounds.  ?Pulmonary:  ?   Effort: Pulmonary effort is normal.  ?   Breath sounds: Normal breath sounds.  ?Abdominal:  ?   General: Bowel sounds are normal.  ?   Palpations: Abdomen is soft.  ?  Skin: ?   General: Skin is warm and dry.  ?Neurological:  ?   Mental Status: He is alert and oriented to person, place, and time.  ?  ? ? ?  Latest Ref Rng & Units 07/21/2021  ?  2:30 PM  ?CMP  ?Glucose 70 - 99 mg/dL 101    ?BUN 8 - 23 mg/dL 18    ?Creatinine 0.61 - 1.24 mg/dL 1.24    ?Sodium 135 - 145 mmol/L 135    ?Potassium 3.5 - 5.1 mmol/L 3.9    ?Chloride 98 - 111 mmol/L 102    ?CO2 22 - 32 mmol/L 27    ?Calcium 8.9 - 10.3 mg/dL 9.0    ?Total Protein 6.5 - 8.1 g/dL 7.6    ?Total Bilirubin 0.3 - 1.2 mg/dL 0.8    ?Alkaline Phos 38 - 126 U/L 125    ?AST 15 - 41 U/L 15    ?ALT 0 - 44 U/L 11    ? ? ?  Latest Ref Rng & Units 07/21/2021  ?  2:30 PM  ?CBC  ?WBC 4.0 - 10.5 K/uL 5.6    ?Hemoglobin 13.0 - 17.0 g/dL 13.0    ?Hematocrit 39.0 - 52.0 % 39.5    ?Platelets 150 - 400 K/uL 306    ? ? ? ?Assessment and plan- Patient is a 88 y.o. male with metastatic prostate cancer with bone metastases here for routine follow-up ? ?Patient presently gets Lupron every 6 months through urology.His last PSA was in June 2022 when it was 1.65 lower as compared to 1.79 in March 2022.  He has not had a PSA checked since then.  PSA from today is  pending.  As long as his PSA doubling time remains close to 10 months or greater he does not require additional ADT at this time.  We will repeat CBC with differential CMP and PSA in 3 months in 6 months and I wil

## 2021-07-21 NOTE — Progress Notes (Signed)
4/17 pt is having eye surgery.  ?

## 2021-07-22 LAB — TESTOSTERONE: Testosterone: 4 ng/dL — ABNORMAL LOW (ref 264–916)

## 2021-07-26 ENCOUNTER — Other Ambulatory Visit: Payer: Self-pay | Admitting: Oncology

## 2021-07-26 ENCOUNTER — Other Ambulatory Visit: Payer: Self-pay | Admitting: Radiology

## 2021-07-26 DIAGNOSIS — C61 Malignant neoplasm of prostate: Secondary | ICD-10-CM

## 2021-08-06 ENCOUNTER — Ambulatory Visit (INDEPENDENT_AMBULATORY_CARE_PROVIDER_SITE_OTHER): Payer: Medicare Other | Admitting: Urology

## 2021-08-06 VITALS — BP 135/72 | HR 90 | Ht 74.0 in | Wt 230.0 lb

## 2021-08-06 DIAGNOSIS — C61 Malignant neoplasm of prostate: Secondary | ICD-10-CM | POA: Diagnosis not present

## 2021-08-06 NOTE — Progress Notes (Signed)
? ?08/06/2021 ?11:37 AM  ? ?Brandon Dillon. ?December 12, 1927 ?[id number] ? ?Referring provider: Juline Patch, MD ?[address] ?[address] ?[place],  [place] [address] ? ?Chief Complaint  ?Patient presents with  ? Prostate Cancer  ? ? ?Urologic history: ?1.  Castrate resistant metastatic prostate cancer ?-On leuprolide ?-Followed by medical oncology with PSA every 3 months and OV every 6 months ? ? ?HPI: ?88 year-old male presents for annual follow-up. ? ?Has no complaints ?PSA has slowly trended upward and plan by medical oncology is to hold starting an androgen receptor inhibitor unless PSA starts to significantly increase ?No voiding complaints ?PSA 07/21/2021 2.32 ?Scheduled for leuprolide injection later this month ? ? ? ? ?PMH: ?Past Medical History:  ?Diagnosis Date  ? Diabetes mellitus without complication ([facility])   ? Hearing loss   ? Hx of fracture of wrist 07/20/2017  ? >30 years ago. <1989  ? Hyperlipidemia   ? Hypertension   ? Prostate cancer ([facility])   ? ? ?Surgical History: ?Past Surgical History:  ?Procedure Laterality Date  ? CATARACT EXTRACTION W/PHACO Right 09/27/2017  ? Procedure: CATARACT EXTRACTION PHACO AND INTRAOCULAR LENS PLACEMENT ([facility]) RIGHT;  Surgeon: Leandrew Koyanagi, MD;  Location: [facility];  Service: Ophthalmology;  Laterality: Right;  IVA TOPICAL ?DIABETES  ? CATARACT EXTRACTION W/PHACO Left 10/18/2017  ? Procedure: CATARACT EXTRACTION PHACO AND INTRAOCULAR LENS PLACEMENT ([facility])  LEFT DIABETIC;  Surgeon: Leandrew Koyanagi, MD;  Location: [facility];  Service: Ophthalmology;  Laterality: Left;  DIABETIC-ORAL MED  ? COLONOSCOPY  2011  ? normal- Dr?  ? ? ?Home Medications:  ?Allergies as of 08/06/2021   ? ?   Reactions  ? Lisinopril   ? coughing ?coughing  ? ?  ? ?  ?Medication List  ?  ? ?  ? Accurate as of August 06, 2021 11:37 AM. If you have any questions, ask your nurse or doctor.  ?  ?  ? ?  ? ?aspirin 81 MG tablet ?Take 81 mg by mouth daily. ?  ?donepezil 5 MG  tablet ?Commonly known as: Aricept ?Take 1 tablet (5 mg total) by mouth at bedtime. ?  ?freestyle lancets ?AS DIRECTED ?  ?gabapentin 100 MG capsule ?Commonly known as: NEURONTIN ?Take 1 capsule (100 mg total) by mouth 2 (two) times daily. ?  ?glucose blood test strip ?Commonly known as: GE100 Blood Glucose Test ?1 each by Other route daily. Use as instructed- Dx E11.9 ?  ?hydrochlorothiazide 25 MG tablet ?Commonly known as: HYDRODIURIL ?Take 1 tablet (25 mg total) by mouth daily. ?  ?megestrol 20 MG tablet ?Commonly known as: MEGACE ?Take 1 tablet (20 mg total) by mouth 2 (two) times daily as needed (Hot flashes). ?  ?simvastatin 20 MG tablet ?Commonly known as: ZOCOR ?Take 1 tablet (20 mg total) by mouth every morning. ?  ? ?  ? ? ?Allergies:  ?Allergies  ?Allergen Reactions  ? Lisinopril   ?  coughing ?coughing  ? ? ?Family History: ?Family History  ?Family history unknown: Yes  ? ? ?Social History:  reports that he has never smoked. He has never used smokeless tobacco. He reports that he does not drink alcohol and does not use drugs. ? ? ?Physical Exam: ?BP 135/72   Pulse 90   Ht '6\' 2"'$  (1.88 m)   Wt 230 lb (104.3 kg)   BMI 29.53 kg/m?   ?Constitutional:  Alert and oriented, No acute distress. ?HEENT: [place] AT, moist mucus membranes.  Trachea midline, no masses. ?  Cardiovascular: No clubbing, cyanosis, or edema. ?Respiratory: Normal respiratory effort, no increased work of breathing. ?Psychiatric: Normal mood and affect. ? ? ?Assessment & Plan:   ? ?1. Prostate cancer ([facility]) ?Continue medical oncology follow-up ?Continue every 6 month leuprolide injections here ? ? ?Abbie Sons, MD ? ?[facility] ?[address], [address] ?[place], [place] [address] ?[phone] ? ?

## 2021-08-07 ENCOUNTER — Encounter: Payer: Self-pay | Admitting: Urology

## 2021-08-09 DIAGNOSIS — H04562 Stenosis of left lacrimal punctum: Secondary | ICD-10-CM | POA: Diagnosis not present

## 2021-08-09 DIAGNOSIS — E1142 Type 2 diabetes mellitus with diabetic polyneuropathy: Secondary | ICD-10-CM | POA: Diagnosis not present

## 2021-08-09 DIAGNOSIS — E785 Hyperlipidemia, unspecified: Secondary | ICD-10-CM | POA: Diagnosis not present

## 2021-08-09 DIAGNOSIS — I10 Essential (primary) hypertension: Secondary | ICD-10-CM | POA: Diagnosis not present

## 2021-08-09 DIAGNOSIS — D23121 Other benign neoplasm of skin of left upper eyelid, including canthus: Secondary | ICD-10-CM | POA: Diagnosis not present

## 2021-08-09 DIAGNOSIS — R0602 Shortness of breath: Secondary | ICD-10-CM | POA: Diagnosis not present

## 2021-08-09 DIAGNOSIS — Z7982 Long term (current) use of aspirin: Secondary | ICD-10-CM | POA: Diagnosis not present

## 2021-08-09 DIAGNOSIS — H02135 Senile ectropion of left lower eyelid: Secondary | ICD-10-CM | POA: Diagnosis not present

## 2021-08-09 DIAGNOSIS — Z7984 Long term (current) use of oral hypoglycemic drugs: Secondary | ICD-10-CM | POA: Diagnosis not present

## 2021-08-09 DIAGNOSIS — D492 Neoplasm of unspecified behavior of bone, soft tissue, and skin: Secondary | ICD-10-CM | POA: Diagnosis not present

## 2021-08-09 DIAGNOSIS — H02132 Senile ectropion of right lower eyelid: Secondary | ICD-10-CM | POA: Diagnosis not present

## 2021-08-09 DIAGNOSIS — H16213 Exposure keratoconjunctivitis, bilateral: Secondary | ICD-10-CM | POA: Diagnosis not present

## 2021-08-09 DIAGNOSIS — H119 Unspecified disorder of conjunctiva: Secondary | ICD-10-CM | POA: Diagnosis not present

## 2021-08-11 ENCOUNTER — Ambulatory Visit: Payer: Medicare Other | Admitting: Family Medicine

## 2021-08-12 ENCOUNTER — Ambulatory Visit (INDEPENDENT_AMBULATORY_CARE_PROVIDER_SITE_OTHER): Payer: Medicare Other | Admitting: Physician Assistant

## 2021-08-12 DIAGNOSIS — C61 Malignant neoplasm of prostate: Secondary | ICD-10-CM | POA: Diagnosis not present

## 2021-08-12 MED ORDER — LEUPROLIDE ACETATE (6 MONTH) 45 MG ~~LOC~~ KIT
45.0000 mg | PACK | Freq: Once | SUBCUTANEOUS | Status: AC
  Administered 2021-08-12: 45 mg via SUBCUTANEOUS

## 2021-08-12 NOTE — Progress Notes (Signed)
to Prostate Cancer patient is present today for a Eligard Injection. ?  ?Medication: Eligard 6 month ?Dose: 45 mg  ?Location: right buttock  ?Lot: [id number] ?Exp: 12/2022 ?  ?Patient tolerated well, no complications were noted ?  ?Performed by: Kathreen Cosier  CMA ?  ?Per Dr. Bernardo Heater patient is to continue therapy for Indefinately. Patient's next follow up was scheduled for 10/16/2023This appointment was scheduled using wheel and given to patient today along with reminder continue on Vitamin D 800-1000iu and Calium 1000-'1200mg'$  daily while on Androgen Deprivation Therapy.  ?

## 2021-08-27 ENCOUNTER — Telehealth: Payer: Self-pay | Admitting: Family Medicine

## 2021-08-27 NOTE — Telephone Encounter (Signed)
Pts wife Brandon Dillon is calling to confirm appt online on Monday due to remodeling of the building. Please advise  ?

## 2021-08-30 ENCOUNTER — Ambulatory Visit (INDEPENDENT_AMBULATORY_CARE_PROVIDER_SITE_OTHER): Payer: Medicare Other

## 2021-08-30 DIAGNOSIS — Z0000 Encounter for general adult medical examination without abnormal findings: Secondary | ICD-10-CM

## 2021-08-30 NOTE — Patient Instructions (Signed)
Brandon Dillon , ?Thank you for taking time to come for your Medicare Wellness Visit. I appreciate your ongoing commitment to your health goals. Please review the following plan we discussed and let me know if I can assist you in the future.  ? ?Screening recommendations/referrals: ?Colonoscopy: no longer required ?Recommended yearly ophthalmology/optometry visit for glaucoma screening and checkup ?Recommended yearly dental visit for hygiene and checkup ? ?Vaccinations: ?Influenza vaccine: done 02/02/21 ?Pneumococcal vaccine: done 01/15/19 ?Tdap vaccine: due ?Shingles vaccine: done 08/02/19; due for second dose   ?Covid-19: done 05/31/19, 06/28/19 & 03/23/20 ? ?Advanced directives: Please bring a copy of your health care power of attorney and living will to the office at your convenience.  ? ?Conditions/risks identified: Keep up the great work! ? ?Next appointment: Follow up in one year for your annual wellness visit.  ? ?Preventive Care 72 Years and Older, Male ?Preventive care refers to lifestyle choices and visits with your health care provider that can promote health and wellness. ?What does preventive care include? ?A yearly physical exam. This is also called an annual well check. ?Dental exams once or twice a year. ?Routine eye exams. Ask your health care provider how often you should have your eyes checked. ?Personal lifestyle choices, including: ?Daily care of your teeth and gums. ?Regular physical activity. ?Eating a healthy diet. ?Avoiding tobacco and drug use. ?Limiting alcohol use. ?Practicing safe sex. ?Taking low doses of aspirin every day. ?Taking vitamin and mineral supplements as recommended by your health care provider. ?What happens during an annual well check? ?The services and screenings done by your health care provider during your annual well check will depend on your age, overall health, lifestyle risk factors, and family history of disease. ?Counseling  ?Your health care provider may ask you questions  about your: ?Alcohol use. ?Tobacco use. ?Drug use. ?Emotional well-being. ?Home and relationship well-being. ?Sexual activity. ?Eating habits. ?History of falls. ?Memory and ability to understand (cognition). ?Work and work Statistician. ?Screening  ?You may have the following tests or measurements: ?Height, weight, and BMI. ?Blood pressure. ?Lipid and cholesterol levels. These may be checked every 5 years, or more frequently if you are over 63 years old. ?Skin check. ?Lung cancer screening. You may have this screening every year starting at age 51 if you have a 30-pack-year history of smoking and currently smoke or have quit within the past 15 years. ?Fecal occult blood test (FOBT) of the stool. You may have this test every year starting at age 28. ?Flexible sigmoidoscopy or colonoscopy. You may have a sigmoidoscopy every 5 years or a colonoscopy every 10 years starting at age 85. ?Prostate cancer screening. Recommendations will vary depending on your family history and other risks. ?Hepatitis C blood test. ?Hepatitis B blood test. ?Sexually transmitted disease (STD) testing. ?Diabetes screening. This is done by checking your blood sugar (glucose) after you have not eaten for a while (fasting). You may have this done every 1-3 years. ?Abdominal aortic aneurysm (AAA) screening. You may need this if you are a current or former smoker. ?Osteoporosis. You may be screened starting at age 24 if you are at high risk. ?Talk with your health care provider about your test results, treatment options, and if necessary, the need for more tests. ?Vaccines  ?Your health care provider may recommend certain vaccines, such as: ?Influenza vaccine. This is recommended every year. ?Tetanus, diphtheria, and acellular pertussis (Tdap, Td) vaccine. You may need a Td booster every 10 years. ?Zoster vaccine. You may need this after  age 73. ?Pneumococcal 13-valent conjugate (PCV13) vaccine. One dose is recommended after age 78. ?Pneumococcal  polysaccharide (PPSV23) vaccine. One dose is recommended after age 85. ?Talk to your health care provider about which screenings and vaccines you need and how often you need them. ?This information is not intended to replace advice given to you by your health care provider. Make sure you discuss any questions you have with your health care provider. ?Document Released: 05/08/2015 Document Revised: 12/30/2015 Document Reviewed: 02/10/2015 ?Elsevier Interactive Patient Education ? 2017 [place]. ? ?Fall Prevention in the Home ?Falls can cause injuries. They can happen to people of all ages. There are many things you can do to make your home safe and to help prevent falls. ?What can I do on the outside of my home? ?Regularly fix the edges of walkways and driveways and fix any cracks. ?Remove anything that might make you trip as you walk through a door, such as a raised step or threshold. ?Trim any bushes or trees on the path to your home. ?Use bright outdoor lighting. ?Clear any walking paths of anything that might make someone trip, such as rocks or tools. ?Regularly check to see if handrails are loose or broken. Make sure that both sides of any steps have handrails. ?Any raised decks and porches should have guardrails on the edges. ?Have any leaves, snow, or ice cleared regularly. ?Use sand or salt on walking paths during winter. ?Clean up any spills in your garage right away. This includes oil or grease spills. ?What can I do in the bathroom? ?Use night lights. ?Install grab bars by the toilet and in the tub and shower. Do not use towel bars as grab bars. ?Use non-skid mats or decals in the tub or shower. ?If you need to sit down in the shower, use a plastic, non-slip stool. ?Keep the floor dry. Clean up any water that spills on the floor as soon as it happens. ?Remove soap buildup in the tub or shower regularly. ?Attach bath mats securely with double-sided non-slip rug tape. ?Do not have throw rugs and other  things on the floor that can make you trip. ?What can I do in the bedroom? ?Use night lights. ?Make sure that you have a light by your bed that is easy to reach. ?Do not use any sheets or blankets that are too big for your bed. They should not hang down onto the floor. ?Have a firm chair that has side arms. You can use this for support while you get dressed. ?Do not have throw rugs and other things on the floor that can make you trip. ?What can I do in the kitchen? ?Clean up any spills right away. ?Avoid walking on wet floors. ?Keep items that you use a lot in easy-to-reach places. ?If you need to reach something above you, use a strong step stool that has a grab bar. ?Keep electrical cords out of the way. ?Do not use floor polish or wax that makes floors slippery. If you must use wax, use non-skid floor wax. ?Do not have throw rugs and other things on the floor that can make you trip. ?What can I do with my stairs? ?Do not leave any items on the stairs. ?Make sure that there are handrails on both sides of the stairs and use them. Fix handrails that are broken or loose. Make sure that handrails are as long as the stairways. ?Check any carpeting to make sure that it is firmly attached to the stairs.  Fix any carpet that is loose or worn. ?Avoid having throw rugs at the top or bottom of the stairs. If you do have throw rugs, attach them to the floor with carpet tape. ?Make sure that you have a light switch at the top of the stairs and the bottom of the stairs. If you do not have them, ask someone to add them for you. ?What else can I do to help prevent falls? ?Wear shoes that: ?Do not have high heels. ?Have rubber bottoms. ?Are comfortable and fit you well. ?Are closed at the toe. Do not wear sandals. ?If you use a stepladder: ?Make sure that it is fully opened. Do not climb a closed stepladder. ?Make sure that both sides of the stepladder are locked into place. ?Ask someone to hold it for you, if possible. ?Clearly  mark and make sure that you can see: ?Any grab bars or handrails. ?First and last steps. ?Where the edge of each step is. ?Use tools that help you move around (mobility aids) if they are needed. These in

## 2021-08-30 NOTE — Progress Notes (Signed)
? ?Subjective:  ? Brandon Dillon. is a 88 y.o. male who presents for Medicare Annual/Subsequent preventive examination. ? ?Virtual Visit via Telephone Note ? ?I connected with  Brandon Dillon. on 08/30/21 at 11:30 AM EDT by telephone and verified that I am speaking with the correct person using two identifiers. ? ?Location: ?Patient: home ?Provider: [facility] ?Persons participating in the virtual visit: patient/Nurse Health Advisor ?  ?I discussed the limitations, risks, security and privacy concerns of performing an evaluation and management service by telephone and the availability of in person appointments. The patient expressed understanding and agreed to proceed. ? ?Interactive audio and video telecommunications were attempted between this nurse and patient, however failed, due to patient having technical difficulties OR patient did not have access to video capability.  We continued and completed visit with audio only. ? ?Some vital signs may be absent or patient reported.  ? ?Clemetine Marker, LPN ? ? ?Review of Systems    ? ?Cardiac Risk Factors include: advanced age (>98mn, >>65women);diabetes mellitus;dyslipidemia;male gender;hypertension ? ?   ?Objective:  ?  ?There were no vitals filed for this visit. ?There is no height or weight on file to calculate BMI. ? ? ?  08/30/2021  ? 11:27 AM 07/21/2021  ?  2:40 PM 01/21/2021  ? 11:13 AM 08/26/2020  ? 11:16 AM 07/21/2020  ? 10:46 AM 01/21/2020  ?  9:05 AM 10/18/2019  ? 10:09 AM  ?Advanced Directives  ?Does Patient Have a Medical Advance Directive? Yes No Yes Yes Yes No Yes  ?Type of [place]  Living will;Healthcare Power of AMid[place]Living will HState CollegeLiving will    ?Does patient want to make changes to medical advance directive?   Yes (ED - Information included in AVS)  No - Patient declined    ?Copy of [place]in Chart? No - copy requested   No - copy  requested No - copy requested    ?Would patient like information on creating a medical advance directive?  No - Patient declined Yes (ED - Information included in AVS)      ? ? ?Current Medications (verified) ?Outpatient Encounter Medications as of 08/30/2021  ?Medication Sig  ? aspirin 81 MG tablet Take 81 mg by mouth daily.  ? donepezil (ARICEPT) 5 MG tablet Take 1 tablet (5 mg total) by mouth at bedtime.  ? erythromycin ophthalmic ointment SMARTSIG:In Eye(s)  ? gabapentin (NEURONTIN) 100 MG capsule Take 1 capsule (100 mg total) by mouth 2 (two) times daily.  ? glucose blood (GE100 BLOOD GLUCOSE TEST) test strip 1 each by Other route daily. Use as instructed- Dx E11.9  ? hydrochlorothiazide (HYDRODIURIL) 25 MG tablet Take 1 tablet (25 mg total) by mouth daily.  ? Lancets (FREESTYLE) lancets AS DIRECTED  ? megestrol (MEGACE) 20 MG tablet Take 1 tablet (20 mg total) by mouth 2 (two) times daily as needed (Hot flashes).  ? neomycin-polymyxin b-dexamethasone (MAXITROL) 3.5-10000-0.1 SUSP SMARTSIG:In Eye(s)  ? simvastatin (ZOCOR) 20 MG tablet Take 1 tablet (20 mg total) by mouth every morning.  ? ?No facility-administered encounter medications on file as of 08/30/2021.  ? ? ?Allergies (verified) ?Lisinopril  ? ?History: ?Past Medical History:  ?Diagnosis Date  ? Diabetes mellitus without complication ([place]   ? Hearing loss   ? Hx of fracture of wrist 07/20/2017  ? >30 years ago. <1989  ? Hyperlipidemia   ? Hypertension   ? Prostate cancer (  [place])   ? ?Past Surgical History:  ?Procedure Laterality Date  ? CATARACT EXTRACTION W/PHACO Right 09/27/2017  ? Procedure: CATARACT EXTRACTION PHACO AND INTRAOCULAR LENS PLACEMENT ([place]) RIGHT;  Surgeon: Leandrew Koyanagi, MD;  Location: [place];  Service: Ophthalmology;  Laterality: Right;  IVA TOPICAL ?DIABETES  ? CATARACT EXTRACTION W/PHACO Left 10/18/2017  ? Procedure: CATARACT EXTRACTION PHACO AND INTRAOCULAR LENS PLACEMENT ([place])  LEFT DIABETIC;  Surgeon: Leandrew Koyanagi, MD;  Location: [place];  Service: Ophthalmology;  Laterality: Left;  DIABETIC-ORAL MED  ? COLONOSCOPY  2011  ? normal- Dr?  ? ?Family History  ?Family history unknown: Yes  ? ?Social History  ? ?Socioeconomic History  ? Marital status: Married  ?  Spouse name: Not on file  ? Number of children: Not on file  ? Years of education: Not on file  ? Highest education level: Not on file  ?Occupational History  ? Not on file  ?Tobacco Use  ? Smoking status: Never  ? Smokeless tobacco: Never  ?Vaping Use  ? Vaping Use: Never used  ?Substance and Sexual Activity  ? Alcohol use: No  ?  Alcohol/week: 0.0 standard drinks  ? Drug use: No  ? Sexual activity: Yes  ?Other Topics Concern  ? Not on file  ?Social History Narrative  ? Pt lives with wife.   ? ?Social Determinants of Health  ? ?Financial Resource Strain: Low Risk   ? Difficulty of Paying Living Expenses: Not hard at all  ?Food Insecurity: No Food Insecurity  ? Worried About Charity fundraiser in the Last Year: Never true  ? Ran Out of Food in the Last Year: Never true  ?Transportation Needs: No Transportation Needs  ? Lack of Transportation (Medical): No  ? Lack of Transportation (Non-Medical): No  ?Physical Activity: Inactive  ? Days of Exercise per Week: 0 days  ? Minutes of Exercise per Session: 0 min  ?Stress: No Stress Concern Present  ? Feeling of Stress : Not at all  ?Social Connections: Moderately Integrated  ? Frequency of Communication with Friends and Family: More than three times a week  ? Frequency of Social Gatherings with Friends and Family: Twice a week  ? Attends Religious Services: More than 4 times per year  ? Active Member of Clubs or Organizations: No  ? Attends Archivist Meetings: Never  ? Marital Status: Married  ? ? ?Tobacco Counseling ?Counseling given: Not Answered ? ? ?Clinical Intake: ? ?Pre-visit preparation completed: Yes ? ?Pain : No/denies pain ? ?  ? ?Nutritional Risks: None ?Diabetes: Yes ?CBG done?:  No ?Did pt. bring in CBG monitor from home?: No ? ?How often do you need to have someone help you when you read instructions, pamphlets, or other written materials from your doctor or pharmacy?: 1 - Never ? ?Nutrition Risk Assessment: ? ?Has the patient had any N/V/D within the last 2 months?  No  ?Does the patient have any non-healing wounds?  No  ?Has the patient had any unintentional weight loss or weight gain?  No  ? ?Diabetes: ? ?Is the patient diabetic?  Yes  ?If diabetic, was a CBG obtained today?  No  ?Did the patient bring in their glucometer from home?  No  ?How often do you monitor your CBG's? Twice weekly.  ? ?Financial Strains and Diabetes Management: ? ?Are you having any financial strains with the device, your supplies or your medication? No .  ?Does the patient want to be seen  by Chronic Care Management for management of their diabetes?  No  ?Would the patient like to be referred to a Nutritionist or for Diabetic Management?  No  ? ?Diabetic Exams: ? ?Diabetic Eye Exam: Completed 06/04/20. Overdue for diabetic eye exam. Pt has been advised about the importance in completing this exam.  ? ?Diabetic Foot Exam: Completed 08/12/20. Pt has been advised about the importance in completing this exam.   ? ? ?Interpreter Needed?: No ? ?Information entered by :: Clemetine Marker LPN ? ? ?Activities of Daily Living ? ?  08/30/2021  ? 11:30 AM 12/14/2020  ? 10:14 AM  ?In your present state of health, do you have any difficulty performing the following activities:  ?Hearing? 1 1  ?Vision? 0 0  ?Difficulty concentrating or making decisions? 0 1  ?Walking or climbing stairs? 0 0  ?Comment  holds on to rail  ?Dressing or bathing? 0 0  ?Doing errands, shopping? 0 0  ?Preparing Food and eating ? N   ?Using the Toilet? N   ?In the past six months, have you accidently leaked urine? N   ?Do you have problems with loss of bowel control? N   ?Managing your Medications? Y   ?Managing your Finances? Y   ?Housekeeping or managing your  Housekeeping? N   ? ? ?Patient Care Team: ?Juline Patch, MD as PCP - General (Family Medicine) ?Sindy Guadeloupe, MD as Consulting Physician (Hematology and Oncology) ?Stoioff, Ronda Fairly, MD (Urology) ?Schnier, G

## 2021-09-09 ENCOUNTER — Ambulatory Visit (INDEPENDENT_AMBULATORY_CARE_PROVIDER_SITE_OTHER): Payer: Medicare Other | Admitting: Family Medicine

## 2021-09-09 ENCOUNTER — Other Ambulatory Visit: Payer: Self-pay | Admitting: Internal Medicine

## 2021-09-09 ENCOUNTER — Encounter: Payer: Self-pay | Admitting: Family Medicine

## 2021-09-09 VITALS — BP 124/68 | HR 64 | Ht 74.0 in | Wt 230.0 lb

## 2021-09-09 DIAGNOSIS — I10 Essential (primary) hypertension: Secondary | ICD-10-CM

## 2021-09-09 DIAGNOSIS — E782 Mixed hyperlipidemia: Secondary | ICD-10-CM

## 2021-09-09 DIAGNOSIS — R7303 Prediabetes: Secondary | ICD-10-CM | POA: Diagnosis not present

## 2021-09-09 DIAGNOSIS — G2581 Restless legs syndrome: Secondary | ICD-10-CM | POA: Diagnosis not present

## 2021-09-09 MED ORDER — SIMVASTATIN 20 MG PO TABS: 20.0000 mg | ORAL_TABLET | Freq: Every morning | ORAL | 5 refills | Status: DC

## 2021-09-09 MED ORDER — GABAPENTIN 100 MG PO CAPS: 100.0000 mg | ORAL_CAPSULE | Freq: Two times a day (BID) | ORAL | 5 refills | Status: DC

## 2021-09-09 MED ORDER — HYDROCHLOROTHIAZIDE 25 MG PO TABS: 25.0000 mg | ORAL_TABLET | Freq: Every day | ORAL | 5 refills | Status: DC

## 2021-09-09 MED ORDER — GE100 BLOOD GLUCOSE TEST VI STRP: 1.0000 | ORAL_STRIP | Freq: Every day | 1 refills | Status: DC

## 2021-09-09 NOTE — Progress Notes (Signed)
Date:  09/09/2021   Name:  Brandon Dillon.   DOB:  05-03-1927   MRN:  [id number]   Chief Complaint: Prediabetes  Diabetes He presents for his follow-up diabetic visit. Diabetes type: for prediabetes. His disease course has been improving. There are no hypoglycemic associated symptoms. Pertinent negatives for hypoglycemia include no dizziness, headaches or nervousness/anxiousness. There are no diabetic associated symptoms. Pertinent negatives for diabetes include no chest pain and no polydipsia. There are no hypoglycemic complications. Symptoms are stable. There are no diabetic complications. Pertinent negatives for diabetic complications include no CVA, PVD or retinopathy. Risk factors for coronary artery disease include dyslipidemia and hypertension. Current diabetic treatment includes diet. He is following a generally healthy diet.  Hypertension This is a chronic problem. The current episode started more than 1 year ago. The problem has been gradually improving since onset. The problem is controlled. Pertinent negatives include no chest pain, headaches, neck pain, palpitations or shortness of breath. Past treatments include diuretics. The current treatment provides moderate improvement. There is no history of angina, kidney disease, CAD/MI, CVA, heart failure, left ventricular hypertrophy, PVD or retinopathy. There is no history of chronic renal disease, a hypertension causing med or renovascular disease.  Hyperlipidemia The current episode started more than 1 year ago. The problem is controlled. Recent lipid tests were reviewed and are normal. He has no history of chronic renal disease. Pertinent negatives include no chest pain, myalgias or shortness of breath.  Neurologic Problem Primary symptoms comment: restless leg. The current episode started more than 1 year ago. The neurological problem developed gradually. The problem has been waxing and waning since onset. There was no focality noted.  Pertinent negatives include no abdominal pain, back pain, chest pain, dizziness, fever, headaches, nausea, neck pain, palpitations or shortness of breath.   Lab Results  Component Value Date   NA 135 07/21/2021   K 3.9 07/21/2021   CO2 27 07/21/2021   GLUCOSE 101 (H) 07/21/2021   BUN 18 07/21/2021   CREATININE 1.24 07/21/2021   CALCIUM 9.0 07/21/2021   EGFR 44 (L) 04/12/2021   GFRNONAA 55 (L) 07/21/2021   Lab Results  Component Value Date   CHOL 196 04/12/2021   HDL 56 04/12/2021   LDLCALC 122 (H) 04/12/2021   TRIG 101 04/12/2021   CHOLHDL 3.2 04/04/2018   No results found for: TSH Lab Results  Component Value Date   HGBA1C 5.3 04/12/2021   Lab Results  Component Value Date   WBC 5.6 07/21/2021   HGB 13.0 07/21/2021   HCT 39.5 07/21/2021   MCV 93.4 07/21/2021   PLT 306 07/21/2021   Lab Results  Component Value Date   ALT 11 07/21/2021   AST 15 07/21/2021   ALKPHOS 125 07/21/2021   BILITOT 0.8 07/21/2021   No results found for: 25OHVITD2, 25OHVITD3, VD25OH   Review of Systems  Constitutional:  Negative for chills and fever.  HENT:  Negative for drooling, ear discharge, ear pain and sore throat.   Respiratory:  Negative for cough, shortness of breath and wheezing.   Cardiovascular:  Negative for chest pain, palpitations and leg swelling.  Gastrointestinal:  Negative for abdominal pain, blood in stool, constipation, diarrhea and nausea.  Endocrine: Negative for polydipsia.  Genitourinary:  Negative for dysuria, frequency, hematuria and urgency.  Musculoskeletal:  Negative for back pain, myalgias and neck pain.  Skin:  Negative for rash.  Allergic/Immunologic: Negative for environmental allergies.  Neurological:  Negative for dizziness and headaches.  Hematological:  Does not bruise/bleed easily.  Psychiatric/Behavioral:  Negative for suicidal ideas. The patient is not nervous/anxious.    Patient Active Problem List   Diagnosis Date Noted   Benign neoplasm of  eyelid, left 07/06/2021   Conjunctival lesion 07/06/2021   Exposure keratopathy, bilateral 07/06/2021   Punctal stenosis, acquired, left 07/06/2021   Senile ectropion of both lower eyelids 07/06/2021   Idiopathic peripheral neuropathy 08/12/2020   Prediabetes 08/12/2020   Goals of care, counseling/discussion 08/21/2019   Leg pain 08/16/2018   PAD (peripheral artery disease) ([place]) 08/16/2018   DJD (degenerative joint disease) 08/16/2018   Essential hypertension 05/11/2015   Primary prostate adenocarcinoma ([place]) 05/11/2015   Hyperlipidemia 05/11/2015   Prostate hypertrophy 05/11/2015   Taking multiple medications for chronic disease 05/11/2015   Benign prostatic hyperplasia with urinary obstruction 12/07/2014   Elevated prostate specific antigen (PSA) 12/04/2014    Allergies  Allergen Reactions   Lisinopril     coughing coughing    Past Surgical History:  Procedure Laterality Date   CATARACT EXTRACTION W/PHACO Right 09/27/2017   Procedure: CATARACT EXTRACTION PHACO AND INTRAOCULAR LENS PLACEMENT ([place]) RIGHT;  Surgeon: Leandrew Koyanagi, MD;  Location: [place];  Service: Ophthalmology;  Laterality: Right;  IVA TOPICAL DIABETES   CATARACT EXTRACTION W/PHACO Left 10/18/2017   Procedure: CATARACT EXTRACTION PHACO AND INTRAOCULAR LENS PLACEMENT ([place])  LEFT DIABETIC;  Surgeon: Leandrew Koyanagi, MD;  Location: [place];  Service: Ophthalmology;  Laterality: Left;  DIABETIC-ORAL MED   COLONOSCOPY  2011   normal- Dr?    Social History   Tobacco Use   Smoking status: Never   Smokeless tobacco: Never  Vaping Use   Vaping Use: Never used  Substance Use Topics   Alcohol use: No    Alcohol/week: 0.0 standard drinks   Drug use: No     Medication list has been reviewed and updated.  No outpatient medications have been marked as taking for the 09/09/21 encounter (Office Visit) with Juline Patch, MD.       09/09/2021   10:06 AM 02/15/2021   10:14 AM  08/12/2020   11:01 AM 05/17/2019   10:27 AM  GAD 7 : Generalized Anxiety Score  Nervous, Anxious, on Edge 0 0 0 0  Control/stop worrying 0 0 0 0  Worry too much - different things 0 0 0 0  Trouble relaxing 0 0 0 0  Restless 0 0 0 0  Easily annoyed or irritable 0 0 0 0  Afraid - awful might happen 0 0 0 0  Total GAD 7 Score 0 0 0 0  Anxiety Difficulty Not difficult at all          09/09/2021   10:06 AM  Depression screen PHQ 2/9  Decreased Interest 0  Down, Depressed, Hopeless 0  PHQ - 2 Score 0  Altered sleeping 0  Tired, decreased energy 0  Change in appetite 0  Feeling bad or failure about yourself  0  Trouble concentrating 0  Moving slowly or fidgety/restless 0  Suicidal thoughts 0  PHQ-9 Score 0  Difficult doing work/chores Not difficult at all    BP Readings from Last 3 Encounters:  09/09/21 124/68  08/06/21 135/72  07/21/21 (!) 158/74    Physical Exam Vitals and nursing note reviewed.  HENT:     Head: Normocephalic.     Right Ear: External ear normal.     Left Ear: External ear normal.     Nose: Nose normal.  Eyes:  General: No scleral icterus.       Right eye: No discharge.        Left eye: No discharge.     Conjunctiva/sclera: Conjunctivae normal.     Pupils: Pupils are equal, round, and reactive to light.  Neck:     Thyroid: No thyromegaly.     Vascular: No JVD.     Trachea: No tracheal deviation.  Cardiovascular:     Rate and Rhythm: Normal rate and regular rhythm.     Heart sounds: Normal heart sounds. No murmur heard.   No friction rub. No gallop.  Pulmonary:     Effort: No respiratory distress.     Breath sounds: Normal breath sounds. No wheezing, rhonchi or rales.  Abdominal:     General: Bowel sounds are normal.     Palpations: Abdomen is soft. There is no mass.     Tenderness: There is no abdominal tenderness. There is no guarding or rebound.  Musculoskeletal:        General: No tenderness. Normal range of motion.     Cervical back:  Normal range of motion and neck supple.  Lymphadenopathy:     Cervical: No cervical adenopathy.  Skin:    General: Skin is warm.     Findings: No rash.  Neurological:     Mental Status: He is alert and oriented to person, place, and time.     Cranial Nerves: No cranial nerve deficit.     Deep Tendon Reflexes: Reflexes are normal and symmetric.    Wt Readings from Last 3 Encounters:  09/09/21 230 lb (104.3 kg)  08/06/21 230 lb (104.3 kg)  07/21/21 230 lb 9.6 oz (104.6 kg)    BP 124/68   Pulse 64   Ht '6\' 2"'  (1.88 m)   Wt 230 lb (104.3 kg)   BMI 29.53 kg/m   Assessment and Plan:  1. Prediabetes Chronic.  Controlled.  Stable.  Last A1c in the low sixes and will recheck A1c at this time as well as microalbuminuria.  Foot exam was done today and it was normal - Microalbumin / creatinine urine ratio - HgB A1c  2. Essential hypertension Chronic.  Controlled.  Stable.  Blood pressure today is 124/68.  Continue hydrochlorothiazide 25 mg once a day. - hydrochlorothiazide (HYDRODIURIL) 25 MG tablet; Take 1 tablet (25 mg total) by mouth daily.  Dispense: 30 tablet; Refill: 5  3. Mixed hyperlipidemia Chronic.  Controlled.  Stable.  Continue simvastatin 20 mg once a day. - simvastatin (ZOCOR) 20 MG tablet; Take 1 tablet (20 mg total) by mouth every morning.  Dispense: 30 tablet; Refill: 5  4. Restless leg Chronic.  Persistent.  Primarily at night.  Patient for the most part takes his gabapentin nightly but is sometimes necessary to take twice a day.  There is also some neuropathic pain it is alleviated by the gabapentin as well - gabapentin (NEURONTIN) 100 MG capsule; Take 1 capsule (100 mg total) by mouth 2 (two) times daily.  Dispense: 60 capsule; Refill: 5

## 2021-09-10 LAB — HEMOGLOBIN A1C
Est. average glucose Bld gHb Est-mCnc: 117 mg/dL
Hgb A1c MFr Bld: 5.7 % — ABNORMAL HIGH (ref 4.8–5.6)

## 2021-09-10 LAB — MICROALBUMIN / CREATININE URINE RATIO
Creatinine, Urine: 242.2 mg/dL
Microalb/Creat Ratio: 5 mg/g creat (ref 0–29)
Microalbumin, Urine: 11.4 ug/mL

## 2021-09-13 ENCOUNTER — Other Ambulatory Visit: Payer: Self-pay

## 2021-09-13 DIAGNOSIS — I10 Essential (primary) hypertension: Secondary | ICD-10-CM

## 2021-09-13 MED ORDER — LOSARTAN POTASSIUM-HCTZ 50-12.5 MG PO TABS: 1.0000 | ORAL_TABLET | Freq: Every day | ORAL | 2 refills | Status: DC

## 2021-09-13 NOTE — Progress Notes (Signed)
Sent in losartan/ HCTZ

## 2021-09-15 ENCOUNTER — Other Ambulatory Visit: Payer: Self-pay | Admitting: Family Medicine

## 2021-09-15 DIAGNOSIS — R413 Other amnesia: Secondary | ICD-10-CM

## 2021-10-06 ENCOUNTER — Other Ambulatory Visit: Payer: Self-pay | Admitting: Family Medicine

## 2021-10-06 NOTE — Telephone Encounter (Signed)
Requested Prescriptions  Pending Prescriptions Disp Refills  . GE100 BLOOD GLUCOSE TEST test strip [Pharmacy Med Name: GE100 BLOOD GLUCOSE TEST STRIP] 50 strip 0    Sig: CHECK BLOOD SUGAR ONCE DAILY OR AS DIRECTED.     Endocrinology: Diabetes - Testing Supplies Passed - 10/06/2021 11:32 AM      Passed - Valid encounter within last 12 months    Recent Outpatient Visits          3 weeks ago Prediabetes   [facility] Juline Patch, MD   5 months ago Prediabetes   [facility] Juline Patch, MD   7 months ago Mild memory disturbance   [facility] Juline Patch, MD   9 months ago Essential hypertension   [place], Deanna C, MD   1 year ago Essential hypertension   [place], MD      Future Appointments            In 4 months [place], Corlis Leak [place]   In 10 months [place], Ronda Fairly, [place]

## 2021-10-13 ENCOUNTER — Other Ambulatory Visit: Payer: Self-pay | Admitting: Family Medicine

## 2021-10-21 ENCOUNTER — Inpatient Hospital Stay: Payer: Medicare Other | Attending: Oncology

## 2021-10-21 DIAGNOSIS — C61 Malignant neoplasm of prostate: Secondary | ICD-10-CM | POA: Insufficient documentation

## 2021-10-21 LAB — CBC WITH DIFFERENTIAL/PLATELET
Abs Immature Granulocytes: 0.02 10*3/uL (ref 0.00–0.07)
Basophils Absolute: 0.1 10*3/uL (ref 0.0–0.1)
Basophils Relative: 2 %
Eosinophils Absolute: 0.1 10*3/uL (ref 0.0–0.5)
Eosinophils Relative: 3 %
HCT: 38.4 % — ABNORMAL LOW (ref 39.0–52.0)
Hemoglobin: 12.4 g/dL — ABNORMAL LOW (ref 13.0–17.0)
Immature Granulocytes: 0 %
Lymphocytes Relative: 35 %
Lymphs Abs: 1.7 10*3/uL (ref 0.7–4.0)
MCH: 30.4 pg (ref 26.0–34.0)
MCHC: 32.3 g/dL (ref 30.0–36.0)
MCV: 94.1 fL (ref 80.0–100.0)
Monocytes Absolute: 0.5 10*3/uL (ref 0.1–1.0)
Monocytes Relative: 10 %
Neutro Abs: 2.4 10*3/uL (ref 1.7–7.7)
Neutrophils Relative %: 50 %
Platelets: 290 10*3/uL (ref 150–400)
RBC: 4.08 MIL/uL — ABNORMAL LOW (ref 4.22–5.81)
RDW: 12.4 % (ref 11.5–15.5)
WBC: 4.7 10*3/uL (ref 4.0–10.5)
nRBC: 0 % (ref 0.0–0.2)

## 2021-10-21 LAB — COMPREHENSIVE METABOLIC PANEL
ALT: 9 U/L (ref 0–44)
AST: 14 U/L — ABNORMAL LOW (ref 15–41)
Albumin: 4.1 g/dL (ref 3.5–5.0)
Alkaline Phosphatase: 105 U/L (ref 38–126)
Anion gap: 7 (ref 5–15)
BUN: 19 mg/dL (ref 8–23)
CO2: 26 mmol/L (ref 22–32)
Calcium: 8.9 mg/dL (ref 8.9–10.3)
Chloride: 103 mmol/L (ref 98–111)
Creatinine, Ser: 1.25 mg/dL — ABNORMAL HIGH (ref 0.61–1.24)
GFR, Estimated: 54 mL/min — ABNORMAL LOW (ref >60)
Glucose, Bld: 123 mg/dL — ABNORMAL HIGH (ref 70–99)
Potassium: 4.2 mmol/L (ref 3.5–5.1)
Sodium: 136 mmol/L (ref 135–145)
Total Bilirubin: 0.7 mg/dL (ref 0.3–1.2)
Total Protein: 7.2 g/dL (ref 6.5–8.1)

## 2021-10-21 LAB — PSA: Prostatic Specific Antigen: 2.44 ng/mL (ref 0.00–4.00)

## 2021-11-11 ENCOUNTER — Other Ambulatory Visit: Payer: Self-pay | Admitting: Family Medicine

## 2021-11-11 ENCOUNTER — Other Ambulatory Visit: Payer: Self-pay

## 2021-11-11 DIAGNOSIS — I10 Essential (primary) hypertension: Secondary | ICD-10-CM

## 2021-11-11 MED ORDER — LOSARTAN POTASSIUM-HCTZ 50-12.5 MG PO TABS: 1.0000 | ORAL_TABLET | Freq: Every day | ORAL | 0 refills | Status: DC

## 2021-11-23 DIAGNOSIS — H4321 Crystalline deposits in vitreous body, right eye: Secondary | ICD-10-CM | POA: Diagnosis not present

## 2021-11-23 DIAGNOSIS — Z961 Presence of intraocular lens: Secondary | ICD-10-CM | POA: Diagnosis not present

## 2021-11-23 DIAGNOSIS — E119 Type 2 diabetes mellitus without complications: Secondary | ICD-10-CM | POA: Diagnosis not present

## 2021-11-24 ENCOUNTER — Other Ambulatory Visit: Payer: Self-pay | Admitting: Family Medicine

## 2021-11-24 DIAGNOSIS — E782 Mixed hyperlipidemia: Secondary | ICD-10-CM

## 2021-12-08 LAB — HM DIABETES EYE EXAM

## 2021-12-29 ENCOUNTER — Other Ambulatory Visit: Payer: Self-pay | Admitting: Family Medicine

## 2021-12-31 ENCOUNTER — Other Ambulatory Visit: Payer: Self-pay | Admitting: Family Medicine

## 2022-01-03 ENCOUNTER — Other Ambulatory Visit: Payer: Medicare Other

## 2022-01-03 ENCOUNTER — Encounter
Admission: RE | Admit: 2022-01-03 | Discharge: 2022-01-03 | Disposition: A | Discharge status: Home / Self Care | Payer: Medicare Other | Source: Ambulatory Visit | Attending: Oncology | Admitting: Oncology

## 2022-01-03 ENCOUNTER — Ambulatory Visit
Admission: RE | Admit: 2022-01-03 | Discharge: 2022-01-03 | Disposition: A | Discharge status: Home / Self Care | Payer: Medicare Other | Source: Ambulatory Visit | Attending: Oncology | Admitting: Oncology

## 2022-01-03 DIAGNOSIS — C61 Malignant neoplasm of prostate: Secondary | ICD-10-CM | POA: Diagnosis present

## 2022-01-03 MED ORDER — TECHNETIUM TC 99M MEDRONATE IV KIT
20.0000 | PACK | Freq: Once | INTRAVENOUS | Status: AC | PRN
  Administered 2022-01-03: 20.3 via INTRAVENOUS

## 2022-01-21 ENCOUNTER — Encounter: Payer: Self-pay | Admitting: Oncology

## 2022-01-21 ENCOUNTER — Inpatient Hospital Stay (HOSPITAL_BASED_OUTPATIENT_CLINIC_OR_DEPARTMENT_OTHER): Payer: Medicare Other | Admitting: Oncology

## 2022-01-21 ENCOUNTER — Inpatient Hospital Stay: Payer: Medicare Other | Attending: Oncology

## 2022-01-21 VITALS — BP 133/75 | HR 8 | Temp 98.2°F | Resp 16 | Ht 74.0 in | Wt 233.0 lb

## 2022-01-21 DIAGNOSIS — C61 Malignant neoplasm of prostate: Secondary | ICD-10-CM | POA: Insufficient documentation

## 2022-01-21 DIAGNOSIS — Z5181 Encounter for therapeutic drug level monitoring: Secondary | ICD-10-CM

## 2022-01-21 DIAGNOSIS — C7951 Secondary malignant neoplasm of bone: Secondary | ICD-10-CM | POA: Insufficient documentation

## 2022-01-21 DIAGNOSIS — Z79818 Long term (current) use of other agents affecting estrogen receptors and estrogen levels: Secondary | ICD-10-CM

## 2022-01-21 LAB — COMPREHENSIVE METABOLIC PANEL
ALT: 9 U/L (ref 0–44)
AST: 14 U/L — ABNORMAL LOW (ref 15–41)
Albumin: 4 g/dL (ref 3.5–5.0)
Alkaline Phosphatase: 119 U/L (ref 38–126)
Anion gap: 4 — ABNORMAL LOW (ref 5–15)
BUN: 16 mg/dL (ref 8–23)
CO2: 27 mmol/L (ref 22–32)
Calcium: 8.9 mg/dL (ref 8.9–10.3)
Chloride: 105 mmol/L (ref 98–111)
Creatinine, Ser: 1.3 mg/dL — ABNORMAL HIGH (ref 0.61–1.24)
GFR, Estimated: 52 mL/min — ABNORMAL LOW (ref >60)
Glucose, Bld: 149 mg/dL — ABNORMAL HIGH (ref 70–99)
Potassium: 3.7 mmol/L (ref 3.5–5.1)
Sodium: 136 mmol/L (ref 135–145)
Total Bilirubin: 0.8 mg/dL (ref 0.3–1.2)
Total Protein: 7.5 g/dL (ref 6.5–8.1)

## 2022-01-21 LAB — CBC WITH DIFFERENTIAL/PLATELET
Abs Immature Granulocytes: 0.03 10*3/uL (ref 0.00–0.07)
Basophils Absolute: 0.1 10*3/uL (ref 0.0–0.1)
Basophils Relative: 2 %
Eosinophils Absolute: 0.1 10*3/uL (ref 0.0–0.5)
Eosinophils Relative: 3 %
HCT: 37.3 % — ABNORMAL LOW (ref 39.0–52.0)
Hemoglobin: 12.3 g/dL — ABNORMAL LOW (ref 13.0–17.0)
Immature Granulocytes: 1 %
Lymphocytes Relative: 32 %
Lymphs Abs: 1.7 10*3/uL (ref 0.7–4.0)
MCH: 30.7 pg (ref 26.0–34.0)
MCHC: 33 g/dL (ref 30.0–36.0)
MCV: 93 fL (ref 80.0–100.0)
Monocytes Absolute: 0.4 10*3/uL (ref 0.1–1.0)
Monocytes Relative: 8 %
Neutro Abs: 3 10*3/uL (ref 1.7–7.7)
Neutrophils Relative %: 54 %
Platelets: 332 10*3/uL (ref 150–400)
RBC: 4.01 MIL/uL — ABNORMAL LOW (ref 4.22–5.81)
RDW: 13 % (ref 11.5–15.5)
WBC: 5.4 10*3/uL (ref 4.0–10.5)
nRBC: 0 % (ref 0.0–0.2)

## 2022-01-21 LAB — PSA: Prostatic Specific Antigen: 2.72 ng/mL (ref 0.00–4.00)

## 2022-01-23 NOTE — Progress Notes (Signed)
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  At that time he had staging evaluation consistent with metastatic disease to the sacrum and pelvis and was started on Lupron every 6 months.  Last bone scan done on March 2019 again showed metastatic involvement of the right acetabulum and iliac wing and right sacrum.  Patient has been responding to Lupron well and his PSA went down from 6.6 in 20 16-0.5 in 2019.  It went up to 1.2 on 08/13/2019.  Patient has therefore been referred to [place] for consideration of additional treatments for prostate cancer patient is doing well for his age and remains independent of his ADLs.    He gets Lupron through urology.    Interval history-patient is here with his wife today.  Overall he is doing well and denies any specific complaints at this time.  Tolerating Lupron well without any significant hot flashes  ECOG PS- 1 Pain scale- 0   Review of systems- Review of Systems  Constitutional:  Positive for malaise/fatigue. Negative for chills, fever and weight loss.  HENT:  Negative for congestion, ear discharge and nosebleeds.   Eyes:  Negative for blurred vision.  Respiratory:  Negative for cough, hemoptysis, sputum production, shortness of breath and wheezing.   Cardiovascular:  Negative for chest pain,  palpitations, orthopnea and claudication.  Gastrointestinal:  Negative for abdominal pain, blood in stool, constipation, diarrhea, heartburn, melena, nausea and vomiting.  Genitourinary:  Negative for dysuria, flank pain, frequency, hematuria and urgency.  Musculoskeletal:  Negative for back pain, joint pain and myalgias.  Skin:  Negative for rash.  Neurological:  Negative for dizziness, tingling, focal weakness, seizures, weakness and headaches.  Endo/Heme/Allergies:  Does not bruise/bleed easily.  Psychiatric/Behavioral:  Negative for depression and suicidal ideas. The patient does not have insomnia.       Allergies  Allergen Reactions   Lisinopril     coughing coughing     Past Medical History:  Diagnosis Date   Diabetes mellitus without complication ([place])    Hearing loss    Hx of fracture of wrist 07/20/2017   >30 years ago. <1989   Hyperlipidemia    Hypertension    Prostate cancer [facility])      Past Surgical History:  Procedure Laterality Date   CATARACT EXTRACTION W/PHACO Right 09/27/2017   Procedure: CATARACT EXTRACTION PHACO AND INTRAOCULAR LENS PLACEMENT ([place]) RIGHT;  Surgeon: Leandrew Koyanagi, MD;  Location: [place];  Service: Ophthalmology;  Laterality: Right;  IVA TOPICAL DIABETES   CATARACT EXTRACTION W/PHACO Left 10/18/2017   Procedure: CATARACT EXTRACTION PHACO AND INTRAOCULAR LENS PLACEMENT ([place])  LEFT DIABETIC;  Surgeon: Leandrew Koyanagi, MD;  Location: [place];  Service: Ophthalmology;  Laterality: Left;  DIABETIC-ORAL MED   COLONOSCOPY  2011   normal- Dr?    Social History   Socioeconomic History   Marital status: Married    Spouse name: Not  on file   Number of children: Not on file   Years of education: Not on file   Highest education level: Not on file  Occupational History   Not on file  Tobacco Use   Smoking status: Never   Smokeless tobacco: Never  Vaping Use   Vaping Use: Never used  Substance and Sexual  Activity   Alcohol use: No    Alcohol/week: 0.0 standard drinks of alcohol   Drug use: No   Sexual activity: Yes  Other Topics Concern   Not on file  Social History Narrative   Pt lives with wife.    Social Determinants of Health   Financial Resource Strain: Low Risk  (08/30/2021)   Overall Financial Resource Strain (CARDIA)    Difficulty of Paying Living Expenses: Not hard at all  Food Insecurity: No Food Insecurity (08/30/2021)   Hunger Vital Sign    Worried About Running Out of Food in the Last Year: Never true    Ran Out of Food in the Last Year: Never true  Transportation Needs: No Transportation Needs (08/30/2021)   PRAPARE - Hydrologist (Medical): No    Lack of Transportation (Non-Medical): No  Physical Activity: Inactive (08/30/2021)   Exercise Vital Sign    Days of Exercise per Week: 0 days    Minutes of Exercise per Session: 0 min  Stress: No Stress Concern Present (08/30/2021)   [place]    Feeling of Stress : Not at all  Social Connections: Moderately Integrated (08/30/2021)   Social Connection and Isolation Panel [NHANES]    Frequency of Communication with Friends and Family: More than three times a week    Frequency of Social Gatherings with Friends and Family: Twice a week    Attends Religious Services: More than 4 times per year    Active Member of [employer] or Organizations: No    Attends Archivist Meetings: Never    Marital Status: Married  Human resources officer Violence: Not At Risk (08/30/2021)   Humiliation, Afraid, Rape, and Kick questionnaire    Fear of Current or Ex-Partner: No    Emotionally Abused: No    Physically Abused: No    Sexually Abused: No    Family History  Family history unknown: Yes     Current Outpatient Medications:    aspirin 81 MG tablet, Take 81 mg by mouth daily., Disp: , Rfl:    donepezil (ARICEPT) 5 MG tablet, Take 1 tablet (5 mg  total) by mouth at bedtime., Disp: 30 tablet, Rfl: 5   gabapentin (NEURONTIN) 100 MG capsule, Take 1 capsule (100 mg total) by mouth 2 (two) times daily., Disp: 60 capsule, Rfl: 5   GE100 BLOOD GLUCOSE TEST test strip, CHECK BLOOD SUGAR ONCE DAILY OR AS DIRECTED, Disp: 50 strip, Rfl: 0   hydrochlorothiazide (HYDRODIURIL) 25 MG tablet, Take 25 mg by mouth daily., Disp: , Rfl:    Lancets (FREESTYLE) lancets, AS DIRECTED, Disp: 100 each, Rfl: 0   simvastatin (ZOCOR) 20 MG tablet, Take 1 tablet (20 mg total) by mouth every morning., Disp: 90 tablet, Rfl: 0   losartan-hydrochlorothiazide (HYZAAR) 50-12.5 MG tablet, Take 1 tablet by mouth daily., Disp: 90 tablet, Rfl: 0  Physical exam:  Vitals:   01/21/22 1132  BP: 133/75  Pulse: (!) 8  Resp: 16  Temp: 98.2 F (36.8 C)  TempSrc: Oral  Weight: 233 lb (105.7 kg)  Height: '6\' 2"'$  (1.88  m)   Physical Exam Constitutional:      General: He is not in acute distress. Cardiovascular:     Rate and Rhythm: Normal rate and regular rhythm.     Heart sounds: Normal heart sounds.  Pulmonary:     Effort: Pulmonary effort is normal.     Breath sounds: Normal breath sounds.  Abdominal:     General: Bowel sounds are normal.     Palpations: Abdomen is soft.  Skin:    General: Skin is warm and dry.  Neurological:     Mental Status: He is alert and oriented to person, place, and time.         Latest Ref Rng & Units 01/21/2022   10:25 AM  CMP  Glucose 70 - 99 mg/dL 149   BUN 8 - 23 mg/dL 16   Creatinine 0.61 - 1.24 mg/dL 1.30   Sodium 135 - 145 mmol/L 136   Potassium 3.5 - 5.1 mmol/L 3.7   Chloride 98 - 111 mmol/L 105   CO2 22 - 32 mmol/L 27   Calcium 8.9 - 10.3 mg/dL 8.9   Total Protein 6.5 - 8.1 g/dL 7.5   Total Bilirubin 0.3 - 1.2 mg/dL 0.8   Alkaline Phos 38 - 126 U/L 119   AST 15 - 41 U/L 14   ALT 0 - 44 U/L 9       Latest Ref Rng & Units 01/21/2022   10:25 AM  CBC  WBC 4.0 - 10.5 K/uL 5.4   Hemoglobin 13.0 - 17.0 g/dL 12.3    Hematocrit 39.0 - 52.0 % 37.3   Platelets 150 - 400 K/uL 332     No images are attached to the encounter.  NM Bone Scan Whole Body  Result Date: 01/03/2022 CLINICAL DATA:  Castration resistant prostate cancer, PSA 2.44 EXAM: NUCLEAR MEDICINE WHOLE BODY BONE SCAN TECHNIQUE: Whole body anterior and posterior images were obtained approximately 3 hours after intravenous injection of radiopharmaceutical. RADIOPHARMACEUTICALS:  20.3 mCi Technetium-59mMDP IV COMPARISON:  09/02/2019 Radiographic correlation: None recent FINDINGS: Uptake at shoulders, sternoclavicular joints, knees, RIGHT ankle, typically degenerative. Uptake again identified at the RIGHT hemipelvis and sacrum consistent with osseous metastases. Minimal uptake at 2 RIGHT costovertebral junctions likely degenerative. Single focus of abnormal tracer uptake at RIGHT parietal calvarium, not definitely seen on previous exam, question osseous metastasis. No additional worrisome sites of tracer uptake. Expected urinary tract and soft tissue distribution of tracer. IMPRESSION: Single new focus of abnormal tracer uptake at RIGHT parietal calvarium question metastasis. Again identified uptake throughout RIGHT hemipelvis/sacrum consistent with osseous metastases. Electronically Signed   By: MLavonia DanaM.D.   On: 01/03/2022 17:27     Assessment and plan- Patient is a 88y.o. male with nonmetastatic castrate resistant prostate cancer here for routine follow-up  Patient is currently receiving Lupron every 6 months.  His PSA is slowly creeping up but the PSA doubling time has remained more than 10 months.  He will be due for his next dose of Lupron in October 2023.  Given his age and to maintain his quality of life, I am holding off on adding any additional ADT unless the PSA doubling time increases significantly.  I will see him back in 6 months with CBC with differential CMP and PSA and also check labs in 3 months.  He will need a bone scan prior to his  visit with me   Visit Diagnosis 1. [place]   2. Encounter for  monitoring Lupron therapy      Dr. Randa Evens, MD, MPH [facility] at [facility] [phone] 01/23/2022 7:40 PM

## 2022-02-07 ENCOUNTER — Ambulatory Visit: Payer: Medicare Other | Admitting: Physician Assistant

## 2022-02-07 ENCOUNTER — Other Ambulatory Visit: Payer: Self-pay | Admitting: Family Medicine

## 2022-02-07 DIAGNOSIS — I10 Essential (primary) hypertension: Secondary | ICD-10-CM

## 2022-02-10 ENCOUNTER — Other Ambulatory Visit: Payer: Self-pay | Admitting: Internal Medicine

## 2022-02-14 ENCOUNTER — Ambulatory Visit: Payer: Medicare Other | Admitting: Physician Assistant

## 2022-02-16 ENCOUNTER — Other Ambulatory Visit: Payer: Self-pay | Admitting: Family Medicine

## 2022-02-16 DIAGNOSIS — E782 Mixed hyperlipidemia: Secondary | ICD-10-CM

## 2022-02-18 ENCOUNTER — Inpatient Hospital Stay: Payer: Medicare Other | Attending: Internal Medicine

## 2022-02-21 ENCOUNTER — Telehealth: Payer: Self-pay | Admitting: Urology

## 2022-02-21 ENCOUNTER — Encounter: Payer: Self-pay | Admitting: Physician Assistant

## 2022-02-21 ENCOUNTER — Other Ambulatory Visit: Payer: Self-pay | Admitting: Family Medicine

## 2022-02-21 ENCOUNTER — Ambulatory Visit (INDEPENDENT_AMBULATORY_CARE_PROVIDER_SITE_OTHER): Payer: Medicare Other | Admitting: Physician Assistant

## 2022-02-21 VITALS — BP 138/68 | HR 84 | Ht 74.0 in | Wt 230.0 lb

## 2022-02-21 DIAGNOSIS — C61 Malignant neoplasm of prostate: Secondary | ICD-10-CM

## 2022-02-21 DIAGNOSIS — G2581 Restless legs syndrome: Secondary | ICD-10-CM

## 2022-02-21 MED ORDER — LEUPROLIDE ACETATE (6 MONTH) 45 MG ~~LOC~~ KIT
45.0000 mg | PACK | Freq: Once | SUBCUTANEOUS | Status: AC
  Administered 2022-02-21: 45 mg via SUBCUTANEOUS

## 2022-02-21 NOTE — Patient Instructions (Signed)
Please take the following dietary supplements for the duration of your hormone suppression therapy to reduce your risk for bone loss: -Calcium 1000-1200mg daily -Vitamin D 800-1000IU daily  

## 2022-02-21 NOTE — Telephone Encounter (Signed)
Auth # [id number]

## 2022-02-21 NOTE — Progress Notes (Signed)
Eligard SubQ Injection   Due to Prostate Cancer patient is present today for a Eligard Injection.  Medication: Eligard 6 month Dose: 45 mg  Location: left  Lot: [id number] Exp: [id number]   Patient tolerated well, no complications were noted  Performed by: Erven Colla, CMA  Per Dr. Bernardo Heater patient is to continue therapy for indefinitely. Patient's next follow up was scheduled for 6 months. This appointment was scheduled using wheel and given to patient today along with reminder continue on Vitamin D 800-1000iu and Calcium 1000-'1200mg'$  daily while on Androgen Deprivation Therapy.

## 2022-02-21 NOTE — Telephone Encounter (Signed)
Eligard [id number]) authorization complete.  Valid through 02/10/22 - 02/11/23.

## 2022-02-22 ENCOUNTER — Encounter: Payer: Self-pay | Admitting: Family Medicine

## 2022-02-22 ENCOUNTER — Ambulatory Visit (INDEPENDENT_AMBULATORY_CARE_PROVIDER_SITE_OTHER): Payer: Medicare Other | Admitting: Family Medicine

## 2022-02-22 VITALS — BP 120/70 | HR 70 | Ht 74.0 in | Wt 222.0 lb

## 2022-02-22 DIAGNOSIS — R7303 Prediabetes: Secondary | ICD-10-CM

## 2022-02-22 DIAGNOSIS — G2581 Restless legs syndrome: Secondary | ICD-10-CM

## 2022-02-22 DIAGNOSIS — Z23 Encounter for immunization: Secondary | ICD-10-CM

## 2022-02-22 DIAGNOSIS — E782 Mixed hyperlipidemia: Secondary | ICD-10-CM | POA: Diagnosis not present

## 2022-02-22 DIAGNOSIS — I10 Essential (primary) hypertension: Secondary | ICD-10-CM | POA: Diagnosis not present

## 2022-02-22 NOTE — Progress Notes (Signed)
Date:  02/22/2022   Name:  Brandon Dillon.   DOB:  Dec 30, 1927   MRN:  [id number]   Chief Complaint: Hyperlipidemia, Prediabetes, and Flu Vaccine  Hyperlipidemia This is a chronic problem. The current episode started more than 1 year ago. The problem is controlled. Recent lipid tests were reviewed and are normal. He has no history of chronic renal disease, diabetes, hypothyroidism, liver disease, obesity or nephrotic syndrome. Pertinent negatives include no chest pain, focal sensory loss, focal weakness, leg pain, myalgias or shortness of breath. Current antihyperlipidemic treatment includes statins. The current treatment provides moderate improvement of lipids. There are no compliance problems.  Risk factors for coronary artery disease include dyslipidemia and hypertension.  Diabetes He presents for his follow-up diabetic visit. Diabetes type: prediabetes. His disease course has been stable. There are no hypoglycemic associated symptoms. Pertinent negatives for hypoglycemia include no dizziness, headaches, mood changes or nervousness/anxiousness. There are no diabetic associated symptoms. Pertinent negatives for diabetes include no chest pain and no polydipsia. There are no hypoglycemic complications. Symptoms are stable. There are no diabetic complications. Risk factors for coronary artery disease include dyslipidemia and hypertension. Current diabetic treatment includes diet. He is compliant with treatment all of the time. He is following a generally healthy diet. An ACE inhibitor/angiotensin II receptor blocker is being taken. Eye exam is current.  Neurologic Problem The patient's pertinent negatives include no focal sensory loss, focal weakness, loss of balance or slurred speech. Primary symptoms comment: restless leg. This is a chronic problem. The current episode started more than 1 year ago. The problem has been gradually improving since onset. There was no focality noted. Pertinent  negatives include no abdominal pain, back pain, chest pain, dizziness, fever, headaches, nausea, neck pain, palpitations or shortness of breath. The treatment provided moderate relief. His past medical history is significant for dementia. There is no history of a bleeding disorder, a clotting disorder, a CVA, head trauma, liver disease, mood changes or seizures.  Hypertension This is a chronic problem. The current episode started more than 1 year ago. The problem has been gradually improving since onset. The problem is controlled. Pertinent negatives include no chest pain, headaches, neck pain, orthopnea, palpitations, peripheral edema or shortness of breath. Past treatments include angiotensin blockers and diuretics. The current treatment provides moderate improvement. There are no compliance problems.  There is no history of chronic renal disease.    Lab Results  Component Value Date   NA 136 01/21/2022   K 3.7 01/21/2022   CO2 27 01/21/2022   GLUCOSE 149 (H) 01/21/2022   BUN 16 01/21/2022   CREATININE 1.30 (H) 01/21/2022   CALCIUM 8.9 01/21/2022   EGFR 44 (L) 04/12/2021   GFRNONAA 52 (L) 01/21/2022   Lab Results  Component Value Date   CHOL 196 04/12/2021   HDL 56 04/12/2021   LDLCALC 122 (H) 04/12/2021   TRIG 101 04/12/2021   CHOLHDL 3.2 04/04/2018   No results found for: "TSH" Lab Results  Component Value Date   HGBA1C 5.7 (H) 09/09/2021   Lab Results  Component Value Date   WBC 5.4 01/21/2022   HGB 12.3 (L) 01/21/2022   HCT 37.3 (L) 01/21/2022   MCV 93.0 01/21/2022   PLT 332 01/21/2022   Lab Results  Component Value Date   ALT 9 01/21/2022   AST 14 (L) 01/21/2022   ALKPHOS 119 01/21/2022   BILITOT 0.8 01/21/2022   No results found for: "25OHVITD2", "25OHVITD3", "VD25OH"   Review of  Systems  Constitutional:  Negative for chills and fever.  HENT:  Negative for drooling, ear discharge, ear pain and sore throat.   Respiratory:  Negative for cough, shortness of  breath and wheezing.   Cardiovascular:  Negative for chest pain, palpitations, orthopnea and leg swelling.  Gastrointestinal:  Negative for abdominal pain, blood in stool, constipation, diarrhea and nausea.  Endocrine: Negative for polydipsia.  Genitourinary:  Negative for dysuria, frequency, hematuria and urgency.  Musculoskeletal:  Negative for back pain, myalgias and neck pain.  Skin:  Negative for rash.  Allergic/Immunologic: Negative for environmental allergies.  Neurological:  Negative for dizziness, focal weakness, headaches and loss of balance.  Hematological:  Does not bruise/bleed easily.  Psychiatric/Behavioral:  Negative for suicidal ideas. The patient is not nervous/anxious.     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   Punctal stenosis, acquired, left 07/06/2021   Senile ectropion of both lower eyelids 07/06/2021   Idiopathic peripheral neuropathy 08/12/2020   Prediabetes 08/12/2020   Goals of care, counseling/discussion 08/21/2019   Leg pain 08/16/2018   PAD (peripheral artery disease) ([place]) 08/16/2018   DJD (degenerative joint disease) 08/16/2018   Essential hypertension 05/11/2015   Primary prostate adenocarcinoma ([place]) 05/11/2015   Hyperlipidemia 05/11/2015   Prostate hypertrophy 05/11/2015   Taking multiple medications for chronic disease 05/11/2015   Benign prostatic hyperplasia with urinary obstruction 12/07/2014   Elevated prostate specific antigen (PSA) 12/04/2014    Allergies  Allergen Reactions   Lisinopril     coughing coughing    Past Surgical History:  Procedure Laterality Date   CATARACT EXTRACTION W/PHACO Right 09/27/2017   Procedure: CATARACT EXTRACTION PHACO AND INTRAOCULAR LENS PLACEMENT ([place]) RIGHT;  Surgeon: Leandrew Koyanagi, MD;  Location: [place];  Service: Ophthalmology;  Laterality:  Right;  IVA TOPICAL DIABETES   CATARACT EXTRACTION W/PHACO Left 10/18/2017   Procedure: CATARACT EXTRACTION PHACO AND INTRAOCULAR LENS PLACEMENT ([place])  LEFT DIABETIC;  Surgeon: Leandrew Koyanagi, MD;  Location: [place];  Service: Ophthalmology;  Laterality: Left;  DIABETIC-ORAL MED   COLONOSCOPY  2011   normal- Dr?    Social History   Tobacco Use   Smoking status: Never   Smokeless tobacco: Never  Vaping Use   Vaping Use: Never used  Substance Use Topics   Alcohol use: No    Alcohol/week: 0.0 standard drinks of alcohol   Drug use: No     Medication list has been reviewed and updated.  Current Meds  Medication Sig   aspirin 81 MG tablet Take 81 mg by mouth daily.   donepezil (ARICEPT) 5 MG tablet Take 1 tablet (5 mg total) by mouth at bedtime.   gabapentin (NEURONTIN) 100 MG capsule Take 1 capsule (100 mg total) by mouth 2 (two) times daily.   GE100 BLOOD GLUCOSE TEST test strip CHECK BLOOD SUGAR ONCE DAILY OR AS DIRECTED   hydrochlorothiazide (HYDRODIURIL) 25 MG tablet Take 25 mg by mouth daily.   Lancets (FREESTYLE) lancets AS DIRECTED   losartan-hydrochlorothiazide (HYZAAR) 50-12.5 MG tablet Take 1 tablet by mouth daily.   simvastatin (ZOCOR) 20 MG tablet Take 1 tablet (20 mg total) by mouth every morning.       02/22/2022    9:57 AM 09/09/2021   10:06 AM 02/15/2021   10:14 AM 08/12/2020   11:01 AM  GAD 7 : Generalized Anxiety Score  Nervous, Anxious, on Edge  0 0 0 0  Control/stop worrying 0 0 0 0  Worry too much - different things 0 0 0 0  Trouble relaxing 0 0 0 0  Restless 0 0 0 0  Easily annoyed or irritable 0 0 0 0  Afraid - awful might happen 0 0 0 0  Total GAD 7 Score 0 0 0 0  Anxiety Difficulty Not difficult at all Not difficult at all         02/22/2022    9:57 AM 09/09/2021   10:06 AM 08/30/2021   11:27 AM  Depression screen PHQ 2/9  Decreased Interest 0 0 0  Down, Depressed, Hopeless 0 0 0  PHQ - 2 Score 0 0 0  Altered sleeping 0 0    Tired, decreased energy 0 0   Change in appetite 0 0   Feeling bad or failure about yourself  0 0   Trouble concentrating 0 0   Moving slowly or fidgety/restless 0 0   Suicidal thoughts 0 0   PHQ-9 Score 0 0   Difficult doing work/chores Not difficult at all Not difficult at all     BP Readings from Last 3 Encounters:  02/22/22 120/70  02/21/22 138/68  01/21/22 133/75    Physical Exam Vitals and nursing note reviewed.  HENT:     Head: Normocephalic.     Right Ear: Tympanic membrane and external ear normal.     Left Ear: Tympanic membrane and external ear normal.     Nose: Nose normal.     Mouth/Throat:     Mouth: Mucous membranes are moist.  Eyes:     General: No scleral icterus.       Right eye: No discharge.        Left eye: No discharge.     Conjunctiva/sclera: Conjunctivae normal.     Pupils: Pupils are equal, round, and reactive to light.  Neck:     Thyroid: No thyromegaly.     Vascular: No JVD.     Trachea: No tracheal deviation.  Cardiovascular:     Rate and Rhythm: Normal rate and regular rhythm.     Heart sounds: Normal heart sounds. No murmur heard.    No friction rub. No gallop.  Pulmonary:     Effort: No respiratory distress.     Breath sounds: Normal breath sounds. No wheezing or rales.  Abdominal:     General: Bowel sounds are normal.     Palpations: Abdomen is soft. There is no hepatomegaly, splenomegaly or mass.     Tenderness: There is no abdominal tenderness. There is no guarding or rebound.  Musculoskeletal:        General: No tenderness. Normal range of motion.     Cervical back: Normal range of motion and neck supple.  Lymphadenopathy:     Cervical: No cervical adenopathy.  Skin:    General: Skin is warm.     Findings: No rash.  Neurological:     Mental Status: He is alert.     Wt Readings from Last 3 Encounters:  02/22/22 222 lb (100.7 kg)  02/21/22 230 lb (104.3 kg)  01/21/22 233 lb (105.7 kg)    BP 120/70   Pulse 70   Ht 6'  2" (1.88 m)   Wt 222 lb (100.7 kg)   SpO2 96%   BMI 28.50 kg/m   Assessment and Plan: 1. Essential hypertension Chronic.  Controlled.  Stable.  Blood pressure 120/70.  We will continue current dosing  of losartan hydrochlorothiazide 50-12.5 mg once a day.  We will recheck in 4 to 6 months.  2. Prediabetes Chronic.  Controlled.  Stable.  Currently diet controlled.  A1c at 5.8 we will repeat today.  Patient is continuing to controlled well with diet control and pending results will likely continue with this approach. - HgB A1c  3. Mixed hyperlipidemia Chronic.  Controlled.  Stable.  Last LDL was 122.  Patient will continue with current dosing of simvastatin 20 mg once a day. - Lipid Panel With LDL/HDL Ratio  4. Restless leg .  Controlled.  Stable.  Symptomatic relief is with gabapentin 100 mg twice a day.  5. Need for immunization against influenza Discussed and administered - Flu Vaccine QUAD High Dose(Fluad)     Otilio Miu, MD

## 2022-02-23 LAB — LIPID PANEL WITH LDL/HDL RATIO
Cholesterol, Total: 188 mg/dL (ref 100–199)
HDL: 58 mg/dL (ref >39)
LDL Chol Calc (NIH): 115 mg/dL — ABNORMAL HIGH (ref 0–99)
LDL/HDL Ratio: 2 ratio (ref 0.0–3.6)
Triglycerides: 79 mg/dL (ref 0–149)
VLDL Cholesterol Cal: 15 mg/dL (ref 5–40)

## 2022-02-23 LAB — HEMOGLOBIN A1C
Est. average glucose Bld gHb Est-mCnc: 111 mg/dL
Hgb A1c MFr Bld: 5.5 % (ref 4.8–5.6)

## 2022-02-28 ENCOUNTER — Encounter (INDEPENDENT_AMBULATORY_CARE_PROVIDER_SITE_OTHER): Payer: Medicare Other

## 2022-02-28 ENCOUNTER — Other Ambulatory Visit: Payer: Self-pay | Admitting: Family Medicine

## 2022-02-28 ENCOUNTER — Ambulatory Visit (INDEPENDENT_AMBULATORY_CARE_PROVIDER_SITE_OTHER): Payer: Medicare Other | Admitting: Vascular Surgery

## 2022-02-28 DIAGNOSIS — G2581 Restless legs syndrome: Secondary | ICD-10-CM

## 2022-03-01 NOTE — Telephone Encounter (Signed)
Requested Prescriptions  Pending Prescriptions Disp Refills   GE100 BLOOD GLUCOSE TEST test strip [Pharmacy Med Name: GE100 BLOOD GLUCOSE TEST STRIP] 50 strip 0    Sig: Kaleva OR AS DIRECTED     Endocrinology: Diabetes - Testing Supplies Passed - 02/28/2022  1:18 PM      Passed - Valid encounter within last 12 months    Recent Outpatient Visits           1 week ago Essential hypertension   [place] [facility] at [place], [place], MD   5 months ago Prediabetes   [place] [facility] at [place], [place], MD   10 months ago Prediabetes   [place] [facility] at [place], Ramseur, MD   1 year ago Mild memory disturbance   [place] [facility] at [place], Deanna C, MD   1 year ago Essential hypertension   [place] [facility] at [place], [place], MD       Future Appointments             In 5 months Stoioff, Ronda Fairly, MD [place]   In 5 months Debroah Loop, [place]   In 5 months Juline Patch, MD [facility] Primary Care and Sports Medicine at [facility], [facility]             hydrochlorothiazide (HYDRODIURIL) 25 MG tablet [Pharmacy Med Name: HYDROCHLOROTHIAZIDE 25 MG TAB] 30 tablet 0    Sig: Take 1 tablet (25 mg total) by mouth daily.     Cardiovascular: Diuretics - Thiazide Failed - 02/28/2022  1:18 PM      Failed - Cr in normal range and within 180 days    Creatinine, Ser  Date Value Ref Range Status  01/21/2022 1.30 (H) 0.61 - 1.24 mg/dL Final         Passed - K in normal range and within 180 days    Potassium  Date Value Ref Range Status  01/21/2022 3.7 3.5 - 5.1 mmol/L Final         Passed - Na in normal range and within 180 days    Sodium  Date Value Ref Range Status  01/21/2022 136  135 - 145 mmol/L Final  04/12/2021 140 134 - 144 mmol/L Final         Passed - Last BP in normal range    BP Readings from Last 1 Encounters:  02/22/22 120/70         Passed - Valid encounter within last 6 months    Recent Outpatient Visits           1 week ago Essential hypertension   [place] [facility] at [place], Deanna C, MD   5 months ago Prediabetes   [place] [facility] at [place], Deanna C, MD   10 months ago Prediabetes   [place] [facility] Medicine at [place], Deanna C, MD   1 year ago Mild memory disturbance   [place] [facility] Sports Medicine at [place], Deanna C, MD   1 year ago Essential hypertension   [place] [facility] Medicine at [place], Deanna C, MD  Future Appointments             In 5 months Stoioff, Ronda Fairly, MD [place]   In 5 months Debroah Loop, [place]   In 5 months Juline Patch, MD [facility] Primary Care and Sports Medicine at [facility], [facility]             gabapentin (NEURONTIN) 100 MG capsule [Pharmacy Med Name: GABAPENTIN 100 MG CAPSULE] 60 capsule 0    Sig: Take 1 capsule (100 mg total) by mouth 2 (two) times daily.     Neurology: Anticonvulsants - gabapentin Failed - 02/28/2022  1:18 PM      Failed - Cr in normal range and within 360 days    Creatinine, Ser  Date Value Ref Range Status  01/21/2022 1.30 (H) 0.61 - 1.24 mg/dL Final         Passed - Completed PHQ-2 or PHQ-9 in the last 360 days      Passed - Valid encounter within last 12 months    Recent Outpatient Visits           1 week ago Essential hypertension   [place] [facility] at [place], Deanna C, MD   5 months ago Prediabetes   [place] [facility] at [place], [place], MD   10 months ago Prediabetes   [place] [facility] Medicine at [place], Deanna C, MD   1 year ago Mild memory disturbance   [place] [facility] and Sports Medicine at [place], Deanna C, MD   1 year ago Essential hypertension   [place] [facility] Medicine at [place], [place], MD       Future Appointments             In 5 months Stoioff, Ronda Fairly, MD [place]   In 5 months Va[place]ancourt, [place], [place]   In 5 months Juline Patch, MD [place] and Sports Medicine at [facility], [facility]

## 2022-03-01 NOTE — Telephone Encounter (Signed)
Requested medication (s) are due for refill today: Amount not specified  Requested medication (s) are on the active medication list: yes    Last refill: 01/21/22 Amount not specified  Future visit scheduled yes 08/23/22  Notes to clinic:Historical provider, please review. Thank you.  Requested Prescriptions  Pending Prescriptions Disp Refills   hydrochlorothiazide (HYDRODIURIL) 25 MG tablet [Pharmacy Med Name: HYDROCHLOROTHIAZIDE 25 MG TAB] 30 tablet 0    Sig: Take 1 tablet (25 mg total) by mouth daily.     Cardiovascular: Diuretics - Thiazide Failed - 02/28/2022  1:18 PM      Failed - Cr in normal range and within 180 days    Creatinine, Ser  Date Value Ref Range Status  01/21/2022 1.30 (H) 0.61 - 1.24 mg/dL Final         Passed - K in normal range and within 180 days    Potassium  Date Value Ref Range Status  01/21/2022 3.7 3.5 - 5.1 mmol/L Final         Passed - Na in normal range and within 180 days    Sodium  Date Value Ref Range Status  01/21/2022 136 135 - 145 mmol/L Final  04/12/2021 140 134 - 144 mmol/L Final         Passed - Last BP in normal range    BP Readings from Last 1 Encounters:  02/22/22 120/70         Passed - Valid encounter within last 6 months    Recent Outpatient Visits           1 week ago Essential hypertension   [place] [facility] at [place], Deanna C, MD   5 months ago Prediabetes   [place] [facility] Sports Medicine at [place], Deanna C, MD   10 months ago Prediabetes   [place] [facility] Sports Medicine at [place], Deanna C, MD   1 year ago Mild memory disturbance   [place] [facility] and Sports Medicine at [place], Deanna C, MD   1 year ago Essential hypertension   [place] [facility] Medicine at [place], [place], MD       Future Appointments             In 5 months Stoioff, Ronda Fairly, MD  [place]   In 5 months Debroah Loop, PA-C [place]   In 5 months Juline Patch, MD [facility] Health Primary Care and Sports Medicine at [facility], [facility]            Signed Prescriptions Disp Refills   GE100 BLOOD GLUCOSE TEST test strip 50 strip 0    Sig: Protivin OR AS DIRECTED     Endocrinology: Diabetes - Testing Supplies Passed - 02/28/2022  1:18 PM      Passed - Valid encounter within last 12 months    Recent Outpatient Visits           1 week ago Essential hypertension   [place] [facility] Sports Medicine at [place], Deanna C, MD   5 months ago Prediabetes   [place] [facility] and Sports Medicine at [place], Deanna C, MD   10 months ago Prediabetes   [place] and Sports Medicine at [place], Deanna C, MD   1 year ago  Mild memory disturbance   [place] [facility] and Sports Medicine at [place], Deanna C, MD   1 year ago Essential hypertension   [place] Primary [facility] and Sports Medicine at [place], [place], MD       Future Appointments             In 5 months Stoioff, Ronda Fairly, MD [place]   In 5 months Debroah Loop, [place]   In 5 months Juline Patch, MD [facility] Primary Care and Sports Medicine at [facility], [facility]             gabapentin (NEURONTIN) 100 MG capsule 60 capsule 5    Sig: Take 1 capsule (100 mg total) by mouth 2 (two) times daily.     Neurology: Anticonvulsants - gabapentin Failed - 02/28/2022  1:18 PM      Failed - Cr in normal range and within 360 days    Creatinine, Ser  Date Value Ref Range Status  01/21/2022 1.30 (H) 0.61 - 1.24 mg/dL Final         Passed - Completed PHQ-2 or PHQ-9 in the last 360 days      Passed - Valid encounter within last 12 months    Recent Outpatient Visits            1 week ago Essential hypertension   [place] [facility] Medicine at [place], Deanna C, MD   5 months ago Prediabetes   [place] [facility] and [facility] Medicine at [place], [place], MD   10 months ago Prediabetes   [place] [facility] and Sports Medicine at [place], Deanna C, MD   1 year ago Mild memory disturbance   [place] [facility] and Sports Medicine at [place], Deanna C, MD   1 year ago Essential hypertension   [place] [facility] and Sports Medicine at [place], [place], MD       Future Appointments             In 5 months Stoioff, Ronda Fairly, MD [place]   In 5 months Vaillancourt, [place], [place]   In 5 months Juline Patch, MD [place] and Sports Medicine at [facility], [facility]

## 2022-03-21 ENCOUNTER — Telehealth: Payer: Self-pay | Admitting: *Deleted

## 2022-03-21 ENCOUNTER — Other Ambulatory Visit: Payer: Self-pay | Admitting: Family Medicine

## 2022-03-21 DIAGNOSIS — R413 Other amnesia: Secondary | ICD-10-CM

## 2022-03-24 ENCOUNTER — Other Ambulatory Visit (INDEPENDENT_AMBULATORY_CARE_PROVIDER_SITE_OTHER): Payer: Self-pay | Admitting: Vascular Surgery

## 2022-03-24 ENCOUNTER — Ambulatory Visit (INDEPENDENT_AMBULATORY_CARE_PROVIDER_SITE_OTHER): Payer: Medicare Other | Admitting: Vascular Surgery

## 2022-03-24 ENCOUNTER — Encounter (INDEPENDENT_AMBULATORY_CARE_PROVIDER_SITE_OTHER): Payer: Self-pay

## 2022-03-24 ENCOUNTER — Encounter (INDEPENDENT_AMBULATORY_CARE_PROVIDER_SITE_OTHER): Payer: Medicare Other

## 2022-03-24 DIAGNOSIS — I739 Peripheral vascular disease, unspecified: Secondary | ICD-10-CM

## 2022-03-24 NOTE — Progress Notes (Deleted)
MRN : [id number]  Brandon Dillon. is a 88 y.o. (1927-04-27) male who presents with chief complaint of check circulation.  History of Present Illness:   The patient returns to the office for followup and review of the noninvasive studies. There have been no interval changes in lower extremity symptoms. No interval shortening of the patient's claudication distance or development of rest pain symptoms. No new ulcers or wounds have occurred since the last visit.   There have been no significant changes to the patient's overall health care.   The patient denies amaurosis fugax or recent TIA symptoms. There are no recent neurological changes noted. The patient denies history of DVT, PE or superficial thrombophlebitis. The patient denies recent episodes of angina or shortness of breath.    ABI Rt=0.86 and Lt=0.94 (previous ABI's Rt=0.70 and Lt=1.01 )  No outpatient medications have been marked as taking for the 03/24/22 encounter (Appointment) with Delana Meyer, Dolores Lory, MD.    Past Medical History:  Diagnosis Date   Diabetes mellitus without complication ([place])    Hearing loss    Hx of fracture of wrist 07/20/2017   >30 years ago. <1989   Hyperlipidemia    Hypertension    Prostate cancer [facility])     Past Surgical History:  Procedure Laterality Date   CATARACT EXTRACTION W/PHACO Right 09/27/2017   Procedure: CATARACT EXTRACTION PHACO AND INTRAOCULAR LENS PLACEMENT ([place]) RIGHT;  Surgeon: Leandrew Koyanagi, MD;  Location: [facility];  Service: Ophthalmology;  Laterality: Right;  IVA TOPICAL DIABETES   CATARACT EXTRACTION W/PHACO Left 10/18/2017   Procedure: CATARACT EXTRACTION PHACO AND INTRAOCULAR LENS PLACEMENT ([place])  LEFT DIABETIC;  Surgeon: Leandrew Koyanagi, MD;  Location: [facility];  Service: Ophthalmology;  Laterality: Left;  DIABETIC-ORAL MED   COLONOSCOPY  2011   normal- Dr?    Social History Social History   Tobacco Use   Smoking  status: Never   Smokeless tobacco: Never  Vaping Use   Vaping Use: Never used  Substance Use Topics   Alcohol use: No    Alcohol/week: 0.0 standard drinks of alcohol   Drug use: No    Family History Family History  Family history unknown: Yes    Allergies  Allergen Reactions   Lisinopril     coughing coughing     REVIEW OF SYSTEMS (Negative unless checked)  Constitutional: '[]'$ Weight loss  '[]'$ Fever  '[]'$ Chills Cardiac: '[]'$ Chest pain   '[]'$ Chest pressure   '[]'$ Palpitations   '[]'$ Shortness of breath when laying flat   '[]'$ Shortness of breath with exertion. Vascular:  '[x]'$ Pain in legs with walking   '[]'$ Pain in legs at rest  '[]'$ History of DVT   '[]'$ Phlebitis   '[]'$ Swelling in legs   '[]'$ Varicose veins   '[]'$ Non-healing ulcers Pulmonary:   '[]'$ Uses home oxygen   '[]'$ Productive cough   '[]'$ Hemoptysis   '[]'$ Wheeze  '[]'$ COPD   '[]'$ Asthma Neurologic:  '[]'$ Dizziness   '[]'$ Seizures   '[]'$ History of stroke   '[]'$ History of TIA  '[]'$ Aphasia   '[]'$ Vissual changes   '[]'$ Weakness or numbness in arm   '[]'$ Weakness or numbness in leg Musculoskeletal:   '[]'$ Joint swelling   '[]'$ Joint pain   '[]'$ Low back pain Hematologic:  '[]'$ Easy bruising  '[]'$ Easy bleeding   '[]'$ Hypercoagulable state   '[]'$ Anemic Gastrointestinal:  '[]'$ Diarrhea   '[]'$ Vomiting  '[]'$ Gastroesophageal reflux/heartburn   '[]'$ Difficulty swallowing. Genitourinary:  '[]'$ Chronic kidney disease   '[]'$ Difficult urination  '[]'$ Frequent urination   '[]'$ Blood in urine Skin:  '[]'$   Rashes   '[]'$ Ulcers  Psychological:  '[]'$ History of anxiety   '[]'$  History of major depression.  Physical Examination  There were no vitals filed for this visit. There is no height or weight on file to calculate BMI. Gen: WD/WN, NAD Head: [place]/AT, No temporalis wasting.  Ear/Nose/Throat: Hearing grossly intact, nares w/o erythema or drainage Eyes: PER, EOMI, sclera nonicteric.  Neck: Supple, no masses.  No bruit or JVD.  Pulmonary:  Good air movement, no audible wheezing, no use of accessory muscles.  Cardiac: RRR, normal S1, S2, no  Murmurs. Vascular:  mild trophic changes, no open wounds Vessel Right Left  Radial Palpable Palpable  PT Not Palpable Not Palpable  DP Not Palpable Not Palpable  Gastrointestinal: soft, non-distended. No guarding/no peritoneal signs.  Musculoskeletal: M/S 5/5 throughout.  No visible deformity.  Neurologic: CN 2-12 intact. Pain and light touch intact in extremities.  Symmetrical.  Speech is fluent. Motor exam as listed above. Psychiatric: Judgment intact, Mood & affect appropriate for pt's clinical situation. Dermatologic: No rashes or ulcers noted.  No changes consistent with cellulitis.   CBC Lab Results  Component Value Date   WBC 5.4 01/21/2022   HGB 12.3 (L) 01/21/2022   HCT 37.3 (L) 01/21/2022   MCV 93.0 01/21/2022   PLT 332 01/21/2022    BMET    Component Value Date/Time   NA 136 01/21/2022 1025   NA 140 04/12/2021 0841   K 3.7 01/21/2022 1025   CL 105 01/21/2022 1025   CO2 27 01/21/2022 1025   GLUCOSE 149 (H) 01/21/2022 1025   BUN 16 01/21/2022 1025   BUN 18 04/12/2021 0841   CREATININE 1.30 (H) 01/21/2022 1025   CALCIUM 8.9 01/21/2022 1025   GFRNONAA 52 (L) 01/21/2022 1025   GFRAA 55 (L) 01/21/2020 0814   CrCl cannot be calculated (Patient's most recent lab result is older than the maximum 21 days allowed.).  COAG No results found for: "INR", "PROTIME"  Radiology No results found.   Assessment/Plan There are no diagnoses linked to this encounter.   Hortencia Pilar, MD  03/24/2022 12:14 PM

## 2022-04-18 ENCOUNTER — Other Ambulatory Visit: Payer: Self-pay | Admitting: Family Medicine

## 2022-04-18 DIAGNOSIS — R413 Other amnesia: Secondary | ICD-10-CM

## 2022-04-22 ENCOUNTER — Inpatient Hospital Stay: Payer: Medicare Other | Attending: Internal Medicine

## 2022-05-11 ENCOUNTER — Encounter: Payer: Self-pay | Admitting: Internal Medicine

## 2022-05-16 ENCOUNTER — Inpatient Hospital Stay: Payer: 59 | Attending: Internal Medicine

## 2022-05-16 ENCOUNTER — Other Ambulatory Visit: Payer: Self-pay | Admitting: Family Medicine

## 2022-05-16 DIAGNOSIS — C61 Malignant neoplasm of prostate: Secondary | ICD-10-CM | POA: Diagnosis present

## 2022-05-16 DIAGNOSIS — C7951 Secondary malignant neoplasm of bone: Secondary | ICD-10-CM | POA: Diagnosis not present

## 2022-05-16 DIAGNOSIS — R413 Other amnesia: Secondary | ICD-10-CM

## 2022-05-16 LAB — COMPREHENSIVE METABOLIC PANEL
ALT: 9 U/L (ref 0–44)
AST: 16 U/L (ref 15–41)
Albumin: 4 g/dL (ref 3.5–5.0)
Alkaline Phosphatase: 113 U/L (ref 38–126)
Anion gap: 10 (ref 5–15)
BUN: 23 mg/dL (ref 8–23)
CO2: 25 mmol/L (ref 22–32)
Calcium: 8.6 mg/dL — ABNORMAL LOW (ref 8.9–10.3)
Chloride: 104 mmol/L (ref 98–111)
Creatinine, Ser: 1.33 mg/dL — ABNORMAL HIGH (ref 0.61–1.24)
GFR, Estimated: 50 mL/min — ABNORMAL LOW (ref >60)
Glucose, Bld: 161 mg/dL — ABNORMAL HIGH (ref 70–99)
Potassium: 3.8 mmol/L (ref 3.5–5.1)
Sodium: 139 mmol/L (ref 135–145)
Total Bilirubin: 0.5 mg/dL (ref 0.3–1.2)
Total Protein: 7.5 g/dL (ref 6.5–8.1)

## 2022-05-16 LAB — CBC WITH DIFFERENTIAL/PLATELET
Abs Immature Granulocytes: 0.02 10*3/uL (ref 0.00–0.07)
Basophils Absolute: 0.1 10*3/uL (ref 0.0–0.1)
Basophils Relative: 1 %
Eosinophils Absolute: 0.2 10*3/uL (ref 0.0–0.5)
Eosinophils Relative: 3 %
HCT: 37.2 % — ABNORMAL LOW (ref 39.0–52.0)
Hemoglobin: 12.6 g/dL — ABNORMAL LOW (ref 13.0–17.0)
Immature Granulocytes: 0 %
Lymphocytes Relative: 30 %
Lymphs Abs: 1.8 10*3/uL (ref 0.7–4.0)
MCH: 30.7 pg (ref 26.0–34.0)
MCHC: 33.9 g/dL (ref 30.0–36.0)
MCV: 90.7 fL (ref 80.0–100.0)
Monocytes Absolute: 0.4 10*3/uL (ref 0.1–1.0)
Monocytes Relative: 7 %
Neutro Abs: 3.5 10*3/uL (ref 1.7–7.7)
Neutrophils Relative %: 59 %
Platelets: 323 10*3/uL (ref 150–400)
RBC: 4.1 MIL/uL — ABNORMAL LOW (ref 4.22–5.81)
RDW: 12.7 % (ref 11.5–15.5)
WBC: 6 10*3/uL (ref 4.0–10.5)
nRBC: 0 % (ref 0.0–0.2)

## 2022-05-16 LAB — PSA: Prostatic Specific Antigen: 3.93 ng/mL (ref 0.00–4.00)

## 2022-06-06 ENCOUNTER — Other Ambulatory Visit: Payer: Self-pay | Admitting: Family Medicine

## 2022-06-06 DIAGNOSIS — I10 Essential (primary) hypertension: Secondary | ICD-10-CM

## 2022-07-04 ENCOUNTER — Other Ambulatory Visit: Payer: Self-pay | Admitting: Family Medicine

## 2022-07-04 ENCOUNTER — Encounter: Payer: Self-pay | Admitting: Internal Medicine

## 2022-07-04 DIAGNOSIS — I10 Essential (primary) hypertension: Secondary | ICD-10-CM

## 2022-07-05 NOTE — Telephone Encounter (Signed)
Requested Prescriptions  Pending Prescriptions Disp Refills   losartan-hydrochlorothiazide (HYZAAR) 50-12.5 MG tablet [Pharmacy Med Name: LOSARTAN-HCTZ 50-12.5 MG TAB] 90 tablet 0    Sig: Take 1 tablet by mouth daily.     Cardiovascular: ARB + Diuretic Combos Failed - 07/04/2022  7:42 AM      Failed - Cr in normal range and within 180 days    Creatinine, Ser  Date Value Ref Range Status  05/16/2022 1.33 (H) 0.61 - 1.24 mg/dL Final         Passed - K in normal range and within 180 days    Potassium  Date Value Ref Range Status  05/16/2022 3.8 3.5 - 5.1 mmol/L Final         Passed - Na in normal range and within 180 days    Sodium  Date Value Ref Range Status  05/16/2022 139 135 - 145 mmol/L Final  04/12/2021 140 134 - 144 mmol/L Final         Passed - eGFR is 10 or above and within 180 days    GFR calc Af Amer  Date Value Ref Range Status  01/21/2020 55 (L) >60 mL/min Final   GFR, Estimated  Date Value Ref Range Status  05/16/2022 50 (L) >60 mL/min Final    Comment:    (NOTE) Calculated using the CKD-EPI Creatinine Equation (2021)    eGFR  Date Value Ref Range Status  04/12/2021 44 (L) >59 mL/min/1.73 Final         Passed - Patient is not pregnant      Passed - Last BP in normal range    BP Readings from Last 1 Encounters:  02/22/22 120/70         Passed - Valid encounter within last 6 months    Recent Outpatient Visits           4 months ago Essential hypertension   [place] at [place], [place], MD   9 months ago Prediabetes   [place] at [place], Deanna C, MD   1 year ago Prediabetes   [facility] Primary Care & Sports Medicine at [place], Deanna C, MD   1 year ago Mild memory disturbance   [facility] Primary [place] at [place], Deanna C, MD   1 year ago Essential hypertension   [place] at [place], [place], MD       Future Appointments             In 1 month Stoioff, Ronda Fairly, MD [place]   In [place] month Vaillancourt, [place], [place]   In 1 month Juline Patch, MD [place] at [facility], [facility]

## 2022-07-11 ENCOUNTER — Other Ambulatory Visit: Payer: Self-pay | Admitting: Family Medicine

## 2022-07-11 DIAGNOSIS — R413 Other amnesia: Secondary | ICD-10-CM

## 2022-07-12 NOTE — Telephone Encounter (Signed)
Requested Prescriptions  Pending Prescriptions Disp Refills   donepezil (ARICEPT) 5 MG tablet [Pharmacy Med Name: DONEPEZIL HCL 5 MG TABLET] 90 tablet 0    Sig: Take 1 tablet (5 mg total) by mouth at bedtime.     Neurology:  Alzheimer's Agents Passed - 07/11/2022  7:34 AM      Passed - Valid encounter within last 6 months    Recent Outpatient Visits           4 months ago Essential hypertension   [place] at [place], Deanna C, MD   10 months ago Prediabetes   [facility] Primary Care & Sports Medicine at [place], Deanna C, MD   1 year ago Prediabetes   [facility] Primary Care & Sports Medicine at [place], Deanna C, MD   1 year ago Mild memory disturbance   [place] at [place], Deanna C, MD   1 year ago Essential hypertension   [place] at [place], [place], MD       Future Appointments             In 1 month [place], Ronda Fairly, MD [place]   In [place] month Vaillancourt, [place], [place]   In 1 month Juline Patch, MD [place] at [facility], [facility]

## 2022-07-22 ENCOUNTER — Inpatient Hospital Stay: Payer: 59

## 2022-07-22 ENCOUNTER — Inpatient Hospital Stay: Payer: 59 | Attending: Internal Medicine

## 2022-07-22 ENCOUNTER — Encounter: Payer: Self-pay | Admitting: Oncology

## 2022-07-22 ENCOUNTER — Inpatient Hospital Stay (HOSPITAL_BASED_OUTPATIENT_CLINIC_OR_DEPARTMENT_OTHER): Payer: 59 | Admitting: Oncology

## 2022-07-22 VITALS — BP 161/93 | HR 70 | Temp 97.6°F | Wt 220.8 lb

## 2022-07-22 DIAGNOSIS — I10 Essential (primary) hypertension: Secondary | ICD-10-CM | POA: Diagnosis not present

## 2022-07-22 DIAGNOSIS — Z5181 Encounter for therapeutic drug level monitoring: Secondary | ICD-10-CM

## 2022-07-22 DIAGNOSIS — Z79818 Long term (current) use of other agents affecting estrogen receptors and estrogen levels: Secondary | ICD-10-CM | POA: Diagnosis not present

## 2022-07-22 DIAGNOSIS — C61 Malignant neoplasm of prostate: Secondary | ICD-10-CM

## 2022-07-22 DIAGNOSIS — C7951 Secondary malignant neoplasm of bone: Secondary | ICD-10-CM | POA: Insufficient documentation

## 2022-07-22 LAB — COMPREHENSIVE METABOLIC PANEL
ALT: 9 U/L (ref 0–44)
AST: 17 U/L (ref 15–41)
Albumin: 4.2 g/dL (ref 3.5–5.0)
Alkaline Phosphatase: 118 U/L (ref 38–126)
Anion gap: 8 (ref 5–15)
BUN: 22 mg/dL (ref 8–23)
CO2: 26 mmol/L (ref 22–32)
Calcium: 9.1 mg/dL (ref 8.9–10.3)
Chloride: 108 mmol/L (ref 98–111)
Creatinine, Ser: 1.1 mg/dL (ref 0.61–1.24)
GFR, Estimated: >60 mL/min (ref >60)
Glucose, Bld: 115 mg/dL — ABNORMAL HIGH (ref 70–99)
Potassium: 3.5 mmol/L (ref 3.5–5.1)
Sodium: 142 mmol/L (ref 135–145)
Total Bilirubin: 1 mg/dL (ref 0.3–1.2)
Total Protein: 7.6 g/dL (ref 6.5–8.1)

## 2022-07-22 LAB — CBC WITH DIFFERENTIAL/PLATELET
Abs Immature Granulocytes: 0.03 10*3/uL (ref 0.00–0.07)
Basophils Absolute: 0.1 10*3/uL (ref 0.0–0.1)
Basophils Relative: 1 %
Eosinophils Absolute: 0.2 10*3/uL (ref 0.0–0.5)
Eosinophils Relative: 4 %
HCT: 39.5 % (ref 39.0–52.0)
Hemoglobin: 12.7 g/dL — ABNORMAL LOW (ref 13.0–17.0)
Immature Granulocytes: 1 %
Lymphocytes Relative: 38 %
Lymphs Abs: 2 10*3/uL (ref 0.7–4.0)
MCH: 30.5 pg (ref 26.0–34.0)
MCHC: 32.2 g/dL (ref 30.0–36.0)
MCV: 94.7 fL (ref 80.0–100.0)
Monocytes Absolute: 0.4 10*3/uL (ref 0.1–1.0)
Monocytes Relative: 8 %
Neutro Abs: 2.6 10*3/uL (ref 1.7–7.7)
Neutrophils Relative %: 48 %
Platelets: 294 10*3/uL (ref 150–400)
RBC: 4.17 MIL/uL — ABNORMAL LOW (ref 4.22–5.81)
RDW: 12.7 % (ref 11.5–15.5)
WBC: 5.3 10*3/uL (ref 4.0–10.5)
nRBC: 0 % (ref 0.0–0.2)

## 2022-07-22 LAB — PSA: Prostatic Specific Antigen: 3.81 ng/mL (ref 0.00–4.00)

## 2022-07-22 NOTE — Progress Notes (Signed)
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  At that time he had staging evaluation consistent with metastatic disease to the sacrum and pelvis and was started on Lupron every 6 months.  Bone scan done on March 2019 again showed metastatic involvement of the right acetabulum and iliac wing and right sacrum.  Patient has been responding to Lupron well and his PSA went down from 6.6 in 20 16-0.5 in 2019.  It went up to 1.2 on 08/13/2019.  Patient has therefore been referred to [place] for consideration of additional treatments for prostate cancer patient is doing well for his age and remains independent of his ADLs.    He gets Lupron through urology.  He has not required any additional antiandrogen treatment so far    Interval history-tolerating Lupron well without any significant side effects.  He still has a good quality of life and remains independent of his ADLs.  Denies any new pain today.  ECOG PS- 1 Pain scale- 0 Opioid associated constipation- no  Review of systems- Review of Systems  Constitutional:  Positive for malaise/fatigue. Negative for chills, fever and weight loss.  HENT:  Negative for congestion, ear discharge and nosebleeds.   Eyes:  Negative for blurred vision.  Respiratory:  Negative for cough, hemoptysis, sputum production, shortness  of breath and wheezing.   Cardiovascular:  Negative for chest pain, palpitations, orthopnea and claudication.  Gastrointestinal:  Negative for abdominal pain, blood in stool, constipation, diarrhea, heartburn, melena, nausea and vomiting.  Genitourinary:  Negative for dysuria, flank pain, frequency, hematuria and urgency.  Musculoskeletal:  Negative for back pain, joint pain and myalgias.  Skin:  Negative for rash.  Neurological:  Negative for dizziness, tingling, focal weakness, seizures, weakness and headaches.  Endo/Heme/Allergies:  Does not bruise/bleed easily.  Psychiatric/Behavioral:  Negative for depression and suicidal ideas. The patient does not have insomnia.       Allergies  Allergen Reactions   Lisinopril     coughing coughing     Past Medical History:  Diagnosis Date   Diabetes mellitus without complication ([place])    Hearing loss    Hx of fracture of wrist 07/20/2017   >30 years ago. <1989   Hyperlipidemia    Hypertension    Prostate cancer [facility])      Past Surgical History:  Procedure Laterality Date   CATARACT EXTRACTION W/PHACO Right 09/27/2017   Procedure: CATARACT EXTRACTION PHACO AND INTRAOCULAR LENS PLACEMENT ([place]) RIGHT;  Surgeon: Leandrew Koyanagi, MD;  Location: [place];  Service: Ophthalmology;  Laterality: Right;  IVA TOPICAL DIABETES   CATARACT EXTRACTION W/PHACO Left 10/18/2017   Procedure: CATARACT EXTRACTION PHACO AND INTRAOCULAR LENS PLACEMENT ([place])  LEFT DIABETIC;  Surgeon: Leandrew Koyanagi, MD;  Location: [place];  Service: Ophthalmology;  Laterality: Left;  DIABETIC-ORAL MED   COLONOSCOPY  2011   normal- Dr?    Social History   Socioeconomic  History   Marital status: Married    Spouse name: Not on file   Number of children: Not on file   Years of education: Not on file   Highest education level: Not on file  Occupational History   Not on file  Tobacco Use   Smoking status: Never   Smokeless tobacco:  Never  Vaping Use   Vaping Use: Never used  Substance and Sexual Activity   Alcohol use: No    Alcohol/week: 0.0 standard drinks of alcohol   Drug use: No   Sexual activity: Yes  Other Topics Concern   Not on file  Social History Narrative   Pt lives with wife.    Social Determinants of Health   Financial Resource Strain: Low Risk  (08/30/2021)   Overall Financial Resource Strain (CARDIA)    Difficulty of Paying Living Expenses: Not hard at all  Food Insecurity: No Food Insecurity (08/30/2021)   Hunger Vital Sign    Worried About Running Out of Food in the Last Year: Never true    Ran Out of Food in the Last Year: Never true  Transportation Needs: No Transportation Needs (08/30/2021)   PRAPARE - Hydrologist (Medical): No    Lack of Transportation (Non-Medical): No  Physical Activity: Inactive (08/30/2021)   Exercise Vital Sign    Days of Exercise per Week: 0 days    Minutes of Exercise per Session: 0 min  Stress: No Stress Concern Present (08/30/2021)   [place]    Feeling of Stress : Not at all  Social Connections: Moderately Integrated (08/30/2021)   Social Connection and Isolation Panel [NHANES]    Frequency of Communication with Friends and Family: More than three times a week    Frequency of Social Gatherings with Friends and Family: Twice a week    Attends Religious Services: More than 4 times per year    Active Member of [employer] or Organizations: No    Attends Archivist Meetings: Never    Marital Status: Married  Human resources officer Violence: Not At Risk (08/30/2021)   Humiliation, Afraid, Rape, and Kick questionnaire    Fear of Current or Ex-Partner: No    Emotionally Abused: No    Physically Abused: No    Sexually Abused: No    Family History  Family history unknown: Yes     Current Outpatient Medications:    aspirin 81 MG tablet, Take 81 mg by mouth daily., Disp: ,  Rfl:    donepezil (ARICEPT) 5 MG tablet, Take 1 tablet (5 mg total) by mouth at bedtime., Disp: 90 tablet, Rfl: 0   gabapentin (NEURONTIN) 100 MG capsule, Take 1 capsule (100 mg total) by mouth 2 (two) times daily., Disp: 60 capsule, Rfl: 5   GE100 BLOOD GLUCOSE TEST test strip, CHECK BLOOD SUGAR ONCE DAILY OR AS DIRECTED, Disp: 50 strip, Rfl: 0   hydrochlorothiazide (HYDRODIURIL) 25 MG tablet, Take 25 mg by mouth daily., Disp: , Rfl:    Lancets (FREESTYLE) lancets, AS DIRECTED, Disp: 100 each, Rfl: 0   losartan-hydrochlorothiazide (HYZAAR) 50-12.5 MG tablet, Take 1 tablet by mouth daily., Disp: 90 tablet, Rfl: 0   simvastatin (ZOCOR) 20 MG tablet, Take 1 tablet (20 mg total) by mouth every morning., Disp: 90 tablet, Rfl: 0  Physical exam:  Vitals:   07/22/22 0955 07/22/22 1001  BP: (!) 182/76 (!) 161/93  Pulse: 73 70  Temp: 97.6 F (36.4 C)  SpO2: 100%   Weight: 220 lb 12.8 oz (100.2 kg)    Physical Exam Cardiovascular:     Rate and Rhythm: Normal rate and regular rhythm.     Heart sounds: Normal heart sounds.  Pulmonary:     Effort: Pulmonary effort is normal.     Breath sounds: Normal breath sounds.  Abdominal:     General: Bowel sounds are normal.     Palpations: Abdomen is soft.  Skin:    General: Skin is warm and dry.  Neurological:     Mental Status: He is alert and oriented to person, place, and time.         Latest Ref Rng & Units 07/22/2022    9:40 AM  CMP  Glucose 70 - 99 mg/dL 115   BUN 8 - 23 mg/dL 22   Creatinine 0.61 - 1.24 mg/dL 1.10   Sodium 135 - 145 mmol/L 142   Potassium 3.5 - 5.1 mmol/L 3.5   Chloride 98 - 111 mmol/L 108   CO2 22 - 32 mmol/L 26   Calcium 8.9 - 10.3 mg/dL 9.1   Total Protein 6.5 - 8.1 g/dL 7.6   Total Bilirubin 0.3 - 1.2 mg/dL 1.0   Alkaline Phos 38 - 126 U/L 118   AST 15 - 41 U/L 17   ALT 0 - 44 U/L 9       Latest Ref Rng & Units 07/22/2022    9:40 AM  CBC  WBC 4.0 - 10.5 K/uL 5.3   Hemoglobin 13.0 - 17.0 g/dL 12.7    Hematocrit 39.0 - 52.0 % 39.5   Platelets 150 - 400 K/uL 294     No images are attached to the encounter.  No results found.   Assessment and plan- Patient is a 89 y.o. male with history of castrate resistant metastatic prostate cancer here for routine follow-up  Patient had known metastatic disease to his bones even back in 2016 when he was first diagnosed with prostate cancer.  Presently he is on continuous ADT.  There has been a slow rise in his PSA over the years but his PSA doubling time remains more than a year.  He has another bone scan coming up next month.  If bone scan does not show any overt evidence of progression I plan is to keep him on Lupron without any additional oral antiandrogen therapy.  However of bone scan shows progressive disease I will have a low threshold to initiate Zytiga or Xtandi for him.  I explained all this in great detail to patient and his son.  CBC CMP and PSA in 3 and 6 months and I will see him back in 6 months   Visit Diagnosis 1. Primary prostate adenocarcinoma ([place])   2. Encounter for monitoring Lupron therapy      Dr. Randa Evens, MD, MPH [facility] [place] at [facility] [id number] 07/22/2022 5:17 PM

## 2022-08-04 ENCOUNTER — Encounter: Payer: Self-pay | Admitting: Internal Medicine

## 2022-08-11 ENCOUNTER — Ambulatory Visit: Payer: Medicare Other | Admitting: Urology

## 2022-08-11 ENCOUNTER — Other Ambulatory Visit: Payer: Self-pay | Admitting: Family Medicine

## 2022-08-11 ENCOUNTER — Encounter: Payer: Self-pay | Admitting: Urology

## 2022-08-11 ENCOUNTER — Ambulatory Visit (INDEPENDENT_AMBULATORY_CARE_PROVIDER_SITE_OTHER): Payer: 59 | Admitting: Urology

## 2022-08-11 VITALS — BP 137/61 | HR 90 | Ht 74.0 in | Wt 225.0 lb

## 2022-08-11 DIAGNOSIS — C61 Malignant neoplasm of prostate: Secondary | ICD-10-CM | POA: Diagnosis not present

## 2022-08-11 DIAGNOSIS — E782 Mixed hyperlipidemia: Secondary | ICD-10-CM

## 2022-08-11 NOTE — Progress Notes (Signed)
I, Brandon Dillon,acting as a scribe for Riki Altes, MD.,have documented all relevant documentation on the behalf of Riki Altes, MD,as directed by  Riki Altes, MD while in the presence of Riki Altes, MD.   08/11/22 3:49 PM   Brandon Dillon. [birth year] [id number]  Referring provider: Duanne Limerick, MD [address] [place],  [place] [address]  Chief Complaint  Patient presents with   Prostate Cancer    Urologic history: 1.  Castrate resistant metastatic prostate cancer -On leuprolide -Followed by medical oncology with PSA every 3 months and OV every 6 months  HPI: 89 year-old male presents for annual follow-up.  He saw Dr. Smith Robert in medical oncology 07/22/22. PSA slowly increasing (1.79 06/2020-3.81 07/02/22) but doubling time greater than 10 months.  Dr. Smith Robert recommend continuing ADT- last leuprolide injection was in our office 02/21/22 He is scheduled for an injection 08/18/22 Doing well since last visit No bothersome LUTS Denies dysuria, gross hematuria Denies flank, abdominal or pelvic pain   PSA trend:  Prostatic Specific Antigen  Latest Ref Rng 0.00 - 4.00 ng/mL  10/18/2019 1.15   01/21/2020 1.20   04/23/2020 1.44   07/21/2020 1.79   10/21/2020 1.65   07/21/2021 2.32   10/21/2021 2.44   01/21/2022 2.72   05/16/2022 3.93   07/22/2022 3.81     PMH: Past Medical History:  Diagnosis Date   Diabetes mellitus without complication    Hearing loss    Hx of fracture of wrist 07/20/2017   >30 years ago. <1989   Hyperlipidemia    Hypertension    Prostate cancer     Surgical History: Past Surgical History:  Procedure Laterality Date   CATARACT EXTRACTION W/PHACO Right 09/27/2017   Procedure: CATARACT EXTRACTION PHACO AND INTRAOCULAR LENS PLACEMENT ([facility]) RIGHT;  Surgeon: Lockie Mola, MD;  [facility];  Service: Ophthalmology;  Laterality: Right;  IVA TOPICAL DIABETES   CATARACT EXTRACTION W/PHACO Left 10/18/2017    Procedure: CATARACT EXTRACTION PHACO AND INTRAOCULAR LENS PLACEMENT ([facility])  LEFT DIABETIC;  Surgeon: Lockie Mola, MD;  Location[facility];  Service: Ophthalmology;  Laterality: Left;  DIABETIC-ORAL MED   COLONOSCOPY  2011   normal- Dr?    Home Medications:  Allergies as of 08/11/2022       Reactions   Lisinopril    coughing coughing        Medication List        Accurate as of August 11, 2022  3:49 PM. If you have any questions, ask your nurse or doctor.          aspirin 81 MG tablet Take 81 mg by mouth daily.   donepezil 5 MG tablet Commonly known as: ARICEPT Take 1 tablet (5 mg total) by mouth at bedtime.   freestyle lancets AS DIRECTED   gabapentin 100 MG capsule Commonly known as: NEURONTIN Take 1 capsule (100 mg total) by mouth 2 (two) times daily.   GE100 Blood Glucose Test test strip Generic drug: glucose blood CHECK BLOOD SUGAR ONCE DAILY OR AS DIRECTED   hydrochlorothiazide 25 MG tablet Commonly known as: HYDRODIURIL Take 25 mg by mouth daily.   losartan-hydrochlorothiazide 50-12.5 MG tablet Commonly known as: HYZAAR Take 1 tablet by mouth daily.   simvastatin 20 MG tablet Commonly known as: ZOCOR Take 1 tablet (20 mg total) by mouth every morning.        Allergies:  Allergies  Allergen Reactions   Lisinopril  coughing coughing    Family History: Family History  Family history unknown: Yes    Social History:  reports that he has never smoked. He has never used smokeless tobacco. He reports that he does not drink alcohol and does not use drugs.   Physical Exam: BP 137/61   Pulse 90   Ht 6\' 2"  (1.88 m)   Wt 225 lb (102.1 kg)   BMI 28.89 kg/m   Constitutional:  Alert and oriented, No acute distress. HEENT: [place] AT Respiratory: Normal respiratory effort, no increased work of breathing. Psychiatric: Normal mood and affect.  Assessment & Plan:    1. Prostate cancer [facility]) Slowly rising PSA Continue medical  oncology follow-up  Scheduled for leuprolide injection 08/18/22 One year follow-up here  I have reviewed the above documentation for accuracy and completeness, and I agree with the above.   Riki Altes, MD  [facility] [address] [place], [place] [address] [phone]

## 2022-08-18 ENCOUNTER — Ambulatory Visit (INDEPENDENT_AMBULATORY_CARE_PROVIDER_SITE_OTHER): Payer: 59 | Admitting: Physician Assistant

## 2022-08-18 VITALS — BP 130/63 | HR 79

## 2022-08-18 DIAGNOSIS — C61 Malignant neoplasm of prostate: Secondary | ICD-10-CM | POA: Diagnosis not present

## 2022-08-18 MED ORDER — LEUPROLIDE ACETATE (6 MONTH) 45 MG ~~LOC~~ KIT
45.0000 mg | PACK | Freq: Once | SUBCUTANEOUS | Status: AC
  Administered 2022-08-18: 45 mg via SUBCUTANEOUS

## 2022-08-18 NOTE — Progress Notes (Signed)
Eligard SubQ Injection   Due to Prostate Cancer patient is present today for a Eligard Injection.  Medication: Eligard 6 month Dose: 45 mg  Location: right  Lot: [id number] Exp: 12/23/2023  Patient tolerated well, no complications were noted  Performed by: Jearld Pies RMA  Per Dr. Rushie Chestnut patient is to continue therapy for indefinitely . Patient's next follow up was scheduled for 02/07/23. This appointment was scheduled using wheel and given to patient today along with reminder continue on Vitamin D 800-1000iu and Calcium 1000-1200mg  daily while on Androgen Deprivation Therapy.  PA approval dates: dates 02/10/22 - 02/11/23

## 2022-08-22 ENCOUNTER — Other Ambulatory Visit: Payer: Self-pay | Admitting: Family Medicine

## 2022-08-22 ENCOUNTER — Encounter
Admission: RE | Admit: 2022-08-22 | Discharge: 2022-08-22 | Disposition: A | Discharge status: Home / Self Care | Payer: 59 | Source: Ambulatory Visit | Attending: Oncology | Admitting: Oncology

## 2022-08-22 DIAGNOSIS — C61 Malignant neoplasm of prostate: Secondary | ICD-10-CM

## 2022-08-22 DIAGNOSIS — C7951 Secondary malignant neoplasm of bone: Secondary | ICD-10-CM | POA: Diagnosis not present

## 2022-08-22 DIAGNOSIS — G2581 Restless legs syndrome: Secondary | ICD-10-CM

## 2022-08-22 MED ORDER — TECHNETIUM TC 99M MEDRONATE IV KIT
20.0000 | PACK | Freq: Once | INTRAVENOUS | Status: AC | PRN
  Administered 2022-08-22: 21.51 via INTRAVENOUS

## 2022-08-23 ENCOUNTER — Ambulatory Visit (INDEPENDENT_AMBULATORY_CARE_PROVIDER_SITE_OTHER): Payer: 59 | Admitting: Family Medicine

## 2022-08-23 ENCOUNTER — Encounter: Payer: Self-pay | Admitting: Family Medicine

## 2022-08-23 ENCOUNTER — Other Ambulatory Visit: Payer: Self-pay | Admitting: Family Medicine

## 2022-08-23 VITALS — BP 122/60 | HR 68 | Ht 74.0 in | Wt 224.0 lb

## 2022-08-23 DIAGNOSIS — R7303 Prediabetes: Secondary | ICD-10-CM | POA: Diagnosis not present

## 2022-08-23 DIAGNOSIS — G2581 Restless legs syndrome: Secondary | ICD-10-CM

## 2022-08-23 DIAGNOSIS — R413 Other amnesia: Secondary | ICD-10-CM

## 2022-08-23 DIAGNOSIS — I10 Essential (primary) hypertension: Secondary | ICD-10-CM | POA: Diagnosis not present

## 2022-08-23 DIAGNOSIS — E782 Mixed hyperlipidemia: Secondary | ICD-10-CM

## 2022-08-23 MED ORDER — VALSARTAN 40 MG PO TABS
40.0000 mg | ORAL_TABLET | Freq: Every day | ORAL | 3 refills | Status: DC
Start: 2022-08-23 — End: 2022-11-10

## 2022-08-23 MED ORDER — GABAPENTIN 100 MG PO CAPS: 100.0000 mg | ORAL_CAPSULE | Freq: Every day | ORAL | 5 refills | Status: DC

## 2022-08-23 MED ORDER — DONEPEZIL HCL 5 MG PO TABS: 5.0000 mg | ORAL_TABLET | Freq: Every day | ORAL | 1 refills | Status: DC

## 2022-08-23 NOTE — Patient Instructions (Signed)
GUIDELINES FOR  °LOW-CHOLESTEROL, LOW-TRIGLYCERIDE DIETS  °  °FOODS TO USE  ° °MEATS, FISH Choose lean meats (chicken, turkey, veal, and non-fatty cuts of beef with excess fat trimmed; one serving = 3 oz of cooked meat). Also, fresh or frozen fish, canned fish packed in water, and shellfish (lobster, crabs, shrimp, and oysters). Limit use to no more than one serving of one of these per week. Shellfish are high in cholesterol but low in saturated fat and should be used sparingly. Meats and fish should be broiled (pan or oven) or baked on a rack.  °EGGS Egg substitutes and egg whites (use freely). Egg yolks (limit two per week).  °FRUITS Eat three servings of fresh fruit per day (1 serving = ½ cup). Be sure to have at least one citrus fruit daily. Frozen and canned fruit with no sugar or syrup added may be used.  °VEGETABLES Most vegetables are not limited (see next page). One dark-green (string beans, escarole) or one deep yellow (squash) vegetable is recommended daily. Cauliflower, broccoli, and celery, as well as potato skins, are recommended for their fiber content. (Fiber is associated with cholesterol reduction) It is preferable to steam vegetables, but they may be boiled, strained, or braised with polyunsaturated vegetable oil (see below).  °BEANS Dried peas or beans (1 serving = ½ cup) may be used as a bread substitute.  °NUTS Almonds, walnuts, and peanuts may be used sparingly  °(1 serving = 1 Tablespoonful). Use pumpkin, sesame, or sunflower seeds.  °BREADS, GRAINS One roll or one slice of whole grain or enriched bread may be used, or three soda crackers or four pieces of melba toast as a substitute. Spaghetti, rice or noodles (½ cup) or ½ large ear of corn may be used as a bread substitute. In preparing these foods do not use butter or shortening, use soft margarine. Also use egg and sugar substitutes.  Choose high fiber grains, such as oats and whole wheat.  °CEREALS Use ½ cup of hot cereal or ¾ cup of  cold cereal per day. Add a sugar substitute if desired, with 99% fat free or skim milk.  °MILK PRODUCTS Always use 99% fat free or skim milk, dairy products such as low fat cheeses (farmer's uncreamed diet cottage), low-fat yogurt, and powdered skim milk.  °FATS, OILS Use soft (not stick) margarine; vegetable oils that are high in polyunsaturated fats (such as safflower, sunflower, soybean, corn, and cottonseed). Always refrigerate meat drippings to harden the fat and remove it before preparing gravies  °DESSERTS, SNACKS Limit to two servings per day; substitute each serving for a bread/cereal serving: ice milk, water sherbet (1/4 cup); unflavored gelatin or gelatin flavored with sugar substitute (1/3 cup); pudding prepared with skim milk (1/2 cup); egg white soufflés; unbuttered popcorn (1 ½ cups). Substitute carob for chocolate.  °BEVERAGES Fresh fruit juices (limit 4 oz per day); black coffee, plain or herbal teas; soft drinks with sugar substitutes; club soda, preferably salt-free; cocoa made with skim milk or nonfat dried milk and water (sugar substitute added if desired); clear broth. Alcohol: limit two servings per day (see second page).  °MISCELLANEOUS ° You may use the following freely: vinegar, spices, herbs, nonfat bouillon, mustard, Worcestershire sauce, soy sauce, flavoring essence.  ° ° ° ° ° ° ° ° ° ° ° ° ° ° ° ° °GUIDELINES FOR  °LOW-CHOLESTEROL, LOW TRIGLYCERIDE DIETS  °  °FOODS TO AVOID  ° °MEATS, FISH Marbled beef, pork, bacon, sausage, and other pork products; fatty   fowl (duck, goose); skin and fat of turkey and chicken; processed meats; luncheon meats (salami, bologna); frankfurters and fast-food hamburgers (theyre loaded with fat); organ meats (kidneys, liver); canned fish packed in oil.  °EGGS Limit egg yolks to two per week.   °FRUITS Coconuts (rich in saturated fats).  °VEGETABLES Avoid avocados. Starchy vegetables (potatoes, corn, lima beans, dried peas, beans) may be used only if  substitutes for a serving of bread or cereal. (Baked potato skin, however, is desirable for its fiber content.  °BEANS Commercial baked beans with sugar and/or pork added.  °NUTS Avoid nuts.  Limit peanuts and walnuts to one tablespoonful per day.  °BREADS, GRAINS Any baked goods with shortening and/or sugar. Commercial mixes with dried eggs and whole milk. Avoid sweet rolls, doughnuts, breakfast pastries (Danish), and sweetened packaged cereals (the added sugar converts readily to triglycerides).  °MILK PRODUCTS Whole milk and whole-milk packaged goods; cream; ice cream; whole-milk puddings, yogurt, or cheeses; nondairy cream substitutes.  °FATS, OILS Butter, lard, animal fats, bacon drippings, gravies, cream sauces as well as palm and coconut oils. All these are high in saturated fats. Examine labels on cholesterol free products for hydrogenated fats. (These are oils that have been hardened into solids and in the process have become saturated.)  °DESSERTS, SNACKS Fried snack foods like potato chips; chocolate; candies in general; jams, jellies, syrups; whole- milk puddings; ice cream and milk sherbets; hydrogenated peanut butter.  °BEVERAGES Sugared fruit juices and soft drinks; cocoa made with whole milk and/or sugar. When using alcohol (1 oz liquor, 5 oz beer, or 2 ½ oz dry table wine per serving), one serving must be substituted for one bread or cereal serving (limit, two servings of alcohol per day).  ° SPECIAL NOTES  °  Remember that even non-limited foods should be used in moderation. °While on a cholesterol-lowering diet, be sure to avoid animal fats and marbled meats. °3. While on a triglyceride-lowering diet, be sure to avoid sweets and to control the amount of carbohydrates you eat (starchy foods such as flour, bread, potatoes).While on a tri-glyceride-lowering diet, be sure to avoid sweets °Buy a good low-fat cookbook, such as the one published by the American Heart Association. °Consult your physician  if you have any questions.  ° ° ° ° ° ° ° ° ° ° ° ° ° °[facility] Low Glycemic Diet Plan ° ° °Low Glycemic Foods (20-49) Moderate Glycemic Foods (50-69) High Glycemic Foods (70-100)  °    °Breakfast Creals Breakfast Cereals Breakfast Cereals  °All Bran All-Bran Fruit'n Oats  ° Bran Buds Bran Chex  ° Cheerios Corn chex  °  °Fiber One Oatmeal (not instant)  ° Just Right Mini-Wheats  ° Corn Flakes Cream of Wheat  °  °Oat Bran Special K Swiss Muesli  ° Grape Nuts Grape Nut Flakes  °  °  Grits Nutri-Grain  °  °Fruits and fruit juice: Fruits Puffed Rice Puffed Wheat  °  °(Limit to 1-2 Servings per day) Banana (under-ride) Dates  ° Rice Chex Rice Krispies  °  °Apples Apricots (fresh/dried)  ° Figs Grapes  ° Shredded Wheat Team  °  °Blackberries Blueberries  ° Kiwi Mango  ° Total   °  °Cherries Cranberries  ° Oranges Raisins  °   °Peaches Pears  °  Fruits  °Plums Prunes  ° Fruit Juices Pineapple Watermelon  °  °Grapefruit Raspberries  ° Cranberry Juice Orange Juice  ° Banana (over-ripe)   °  °Strawberries Tangerines  °    °  Apple Juice Grapefruit Juice  ° Beans and Legumes Beverages  °Tomato Juice   ° Boston-type baked beans Sodas, sweet tea, pineapple juice  ° Canned pinto, kidney, or navy beans   °Beans and Legumes (fresh-cooked) Green peas Vegetables  °Black-eyed peas Butter Beans  °  Potato, baked, boiled, fried, mashed  °Chick peas Lentils  ° Vegetables French fries  °Green beans Lima beans  ° Beets Carrots  ° Canned or frozen corn  °Kidney beans Navy beans  ° Sweet potato Yam  ° Parsnips  °Pinto beans Snow peas  ° Corn on the cob Winter squash  °    °Non-starchy vegetables Grains Breads  °Asparagus, avocado, broccoli, cabbage Cornmeal Rice, Monterio  ° Most breads (white and whole grain)  °cauliflower, celery, cucumber, greens Rice, white Couscous  ° Bagels Bread sticks  °  °lettuce, mushrooms, peppers, tomatoes  Bread stuffing Kaiser roll  °  °okra, onions, spinach, summer squash Pasta Dinner rolls  ° Macaroni  Pizza, cheese  °   °Grains Ravioli, meat filled Spaghetti, white  ° Grains  °Barley Bulgur  °  Rice, instant Tapioca, with milk  °  °Rye Wild rice  ° Nuts   ° Cashews Macadamia  ° Candy and most cookies  °Nuts and oils    °Almonds, peanuts, sunflower seeds Snacks Snacks  °hazelnuts, pecans, walnuts Chocolate Ice cream, lowfat  ° Donuts Corn chips  °  °Oils that are liquid at room temperature Muffin Popcorn  ° Jelly beans Pretzels  °  °  Pastries  °Dairy, fish, meat, soy, and eggs    °Milk, skim Lowfat cheese  °  Restaurant and ethnic foods  °Yogurt, lowfat, fruit sugar sweetened  Most Chinese food (sugar in stir fry  °  or wok sauce)  °Lean red meat Fish  °  Teriyaki-style meats and vegetables  °Skinless chicken and turkey, shellfish    °    °Egg whites (up to 3 daily), Soy Products    °Egg yolks (up to 7 or _____ per week)    °  °

## 2022-08-23 NOTE — Progress Notes (Signed)
Date:  08/23/2022   Name:  Brandon Dillon.   DOB:  1928/04/02   MRN:  [id number]   Chief Complaint: Dementia, Prediabetes, Hyperlipidemia, and Hypertension  Hyperlipidemia This is a chronic problem. The current episode started more than 1 year ago. The problem is controlled. Recent lipid tests were reviewed and are normal. Exacerbating diseases include diabetes. Pertinent negatives include no chest pain or shortness of breath.  Hypertension This is a chronic problem. The current episode started more than 1 year ago. The problem has been waxing and waning since onset. The problem is controlled. Pertinent negatives include no blurred vision, chest pain, orthopnea, palpitations, peripheral edema or shortness of breath. Past treatments include angiotensin blockers and diuretics. There are no compliance problems.     Lab Results  Component Value Date   NA 142 07/22/2022   K 3.5 07/22/2022   CO2 26 07/22/2022   GLUCOSE 115 (H) 07/22/2022   BUN 22 07/22/2022   CREATININE 1.10 07/22/2022   CALCIUM 9.1 07/22/2022   EGFR 44 (L) 04/12/2021   GFRNONAA >60 07/22/2022   Lab Results  Component Value Date   CHOL 188 02/22/2022   HDL 58 02/22/2022   LDLCALC 115 (H) 02/22/2022   TRIG 79 02/22/2022   CHOLHDL 3.2 04/04/2018   No results found for: "TSH" Lab Results  Component Value Date   HGBA1C 5.5 02/22/2022   Lab Results  Component Value Date   WBC 5.3 07/22/2022   HGB 12.7 (L) 07/22/2022   HCT 39.5 07/22/2022   MCV 94.7 07/22/2022   PLT 294 07/22/2022   Lab Results  Component Value Date   ALT 9 07/22/2022   AST 17 07/22/2022   ALKPHOS 118 07/22/2022   BILITOT 1.0 07/22/2022   No results found for: "25OHVITD2", "25OHVITD3", "VD25OH"   Review of Systems  Constitutional:  Negative for fatigue.  HENT:  Negative for trouble swallowing.   Eyes:  Negative for blurred vision and visual disturbance.  Respiratory:  Negative for choking, chest tightness, shortness of breath and  wheezing.   Cardiovascular:  Negative for chest pain, palpitations and orthopnea.  Gastrointestinal:  Negative for abdominal pain.    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   Punctal stenosis, acquired, left 07/06/2021   Senile ectropion of both lower eyelids 07/06/2021   Idiopathic peripheral neuropathy 08/12/2020   Prediabetes 08/12/2020   Goals of care, counseling/discussion 08/21/2019   Leg pain 08/16/2018   PAD (peripheral artery disease) (HCC) 08/16/2018   DJD (degenerative joint disease) 08/16/2018   Essential hypertension 05/11/2015   Primary prostate adenocarcinoma (HCC) 05/11/2015   Hyperlipidemia 05/11/2015   Prostate hypertrophy 05/11/2015   Taking multiple medications for chronic disease 05/11/2015   Benign prostatic hyperplasia with urinary obstruction 12/07/2014   Elevated prostate specific antigen (PSA) 12/04/2014    Allergies  Allergen Reactions   Lisinopril     coughing coughing    Past Surgical History:  Procedure Laterality Date   CATARACT EXTRACTION W/PHACO Right 09/27/2017   Procedure: CATARACT EXTRACTION PHACO AND INTRAOCULAR LENS PLACEMENT (IOC) RIGHT;  Surgeon: Lockie Mola, MD;  Location[facility];  Service: Ophthalmology;  Laterality: Right;  IVA TOPICAL DIABETES   CATARACT EXTRACTION W/PHACO Left 10/18/2017   Procedure: CATARACT EXTRACTION PHACO AND INTRAOCULAR LENS PLACEMENT (IOC)  LEFT DIABETIC;  Surgeon: Lockie Mola, MD;  Location: [facility];  Service:  Ophthalmology;  Laterality: Left;  DIABETIC-ORAL MED   COLONOSCOPY  2011   normal- Dr?    Social History   Tobacco Use   Smoking status: Never   Smokeless tobacco: Never  Vaping Use   Vaping Use: Never used  Substance Use Topics   Alcohol use: No    Alcohol/week: 0.0 standard drinks of alcohol   Drug use: No      Medication list has been reviewed and updated.  Current Meds  Medication Sig   aspirin 81 MG tablet Take 81 mg by mouth daily.   GE100 BLOOD GLUCOSE TEST test strip CHECK BLOOD SUGAR ONCE DAILY OR AS DIRECTED   Lancets (FREESTYLE) lancets AS DIRECTED   simvastatin (ZOCOR) 20 MG tablet Take 1 tablet (20 mg total) by mouth every morning.   valsartan (DIOVAN) 40 MG tablet Take 1 tablet (40 mg total) by mouth daily.   [DISCONTINUED] donepezil (ARICEPT) 5 MG tablet Take 1 tablet (5 mg total) by mouth at bedtime.   [DISCONTINUED] gabapentin (NEURONTIN) 100 MG capsule Take 1 capsule (100 mg total) by mouth 2 (two) times daily.   [DISCONTINUED] hydrochlorothiazide (HYDRODIURIL) 25 MG tablet Take 25 mg by mouth daily.   [DISCONTINUED] losartan-hydrochlorothiazide (HYZAAR) 50-12.5 MG tablet Take 1 tablet by mouth daily.       08/23/2022   11:06 AM 02/22/2022    9:57 AM 09/09/2021   10:06 AM 02/15/2021   10:14 AM  GAD 7 : Generalized Anxiety Score  Nervous, Anxious, on Edge 0 0 0 0  Control/stop worrying 0 0 0 0  Worry too much - different things 0 0 0 0  Trouble relaxing 0 0 0 0  Restless 0 0 0 0  Easily annoyed or irritable 0 0 0 0  Afraid - awful might happen 0 0 0 0  Total GAD 7 Score 0 0 0 0  Anxiety Difficulty Not difficult at all Not difficult at all Not difficult at all        08/23/2022   11:06 AM 02/22/2022    9:57 AM 09/09/2021   10:06 AM  Depression screen PHQ 2/9  Decreased Interest 0 0 0  Down, Depressed, Hopeless 0 0 0  PHQ - 2 Score 0 0 0  Altered sleeping 0 0 0  Tired, decreased energy 0 0 0  Change in appetite 0 0 0  Feeling bad or failure about yourself  0 0 0  Trouble concentrating 0 0 0  Moving slowly or fidgety/restless 0 0 0  Suicidal thoughts 0 0 0  PHQ-9 Score 0 0 0  Difficult doing work/chores Not difficult at all Not difficult at all Not difficult at all    BP Readings from Last 3 Encounters:  08/23/22 122/60  08/18/22 130/63  08/11/22  137/61    Physical Exam Vitals and nursing note reviewed.  HENT:     Right Ear: Tympanic membrane normal.     Mouth/Throat:     Mouth: Mucous membranes are dry.  Cardiovascular:     Rate and Rhythm: Normal rate.     Heart sounds: Normal heart sounds. No murmur heard.    No gallop.  Pulmonary:     Effort: No respiratory distress.     Breath sounds: No stridor. No wheezing, rhonchi or rales.  Chest:     Chest wall: No tenderness.  Abdominal:     Palpations: There is no hepatomegaly, splenomegaly or mass.     Tenderness: There is no abdominal tenderness.  Musculoskeletal:  Cervical back: Neck supple.     Wt Readings from Last 3 Encounters:  08/23/22 224 lb (101.6 kg)  08/11/22 225 lb (102.1 kg)  07/22/22 220 lb 12.8 oz (100.2 kg)    BP 122/60   Pulse 68   Ht 6\' 2"  (1.88 m)   Wt 224 lb (101.6 kg)   SpO2 97%   BMI 28.76 kg/m   Assessment and Plan:  1. Mild memory disturbance Chronic.  Slowly progressing.  Described to patient's son and patient that this is merely slowing down our eventual process and that it would benefit to continue the Aricept and that is unlikely that this would cause any risk for continuance. - donepezil (ARICEPT) 5 MG tablet; Take 1 tablet (5 mg total) by mouth at bedtime.  Dispense: 90 tablet; Refill: 1  2. Restless leg Patient may have restless leg but it does help at night so he is not awakened and the risk of falls from having to get out with the restless leg versus the 100 mg Neurontin is acceptable. - gabapentin (NEURONTIN) 100 MG capsule; Take 1 capsule (100 mg total) by mouth at bedtime.  Dispense: 30 capsule; Refill: 5  3. Essential hypertension Chronic.  Relatively controlled but diastolic is decreased to 60.  Patient has decreased mucous membranes and with summer coming in relatively lack of thirst to guide patient adequate hydration we will discontinue the hydrochlorothiazide and continue valsartan at 40 mg since insurance is not  accepting only losartan prescription.  We will recheck blood pressure in 3 months. - valsartan (DIOVAN) 40 MG tablet; Take 1 tablet (40 mg total) by mouth daily.  Dispense: 90 tablet; Refill: 3  4. Mixed hyperlipidemia Chronic.  Controlled.  Stable.  Given the risk of statins and patient relatively forgetting to take on a regular basis we will discontinue cholesterol-lowering statin agent and will depend more on dietary approach.  5. Prediabetes Chronic.  Controlled.  Stable.  Will check A1c.  Will check microalbuminuria to see if there is any issue with diabetic nephropathy and will continue with valsartan for nephrotoxicity control of a diabetic nature. - HgB A1c - Microalbumin / creatinine urine ratio - valsartan (DIOVAN) 40 MG tablet; Take 1 tablet (40 mg total) by mouth daily.  Dispense: 90 tablet; Refill: 3    Elizabeth Sauer, MD

## 2022-08-24 LAB — MICROALBUMIN / CREATININE URINE RATIO
Creatinine, Urine: 118.3 mg/dL
Microalb/Creat Ratio: 4 mg/g creat (ref 0–29)
Microalbumin, Urine: 4.6 ug/mL

## 2022-08-24 LAB — HEMOGLOBIN A1C
Est. average glucose Bld gHb Est-mCnc: 114 mg/dL
Hgb A1c MFr Bld: 5.6 % (ref 4.8–5.6)

## 2022-09-07 ENCOUNTER — Other Ambulatory Visit: Payer: Self-pay | Admitting: Family Medicine

## 2022-09-07 DIAGNOSIS — E782 Mixed hyperlipidemia: Secondary | ICD-10-CM

## 2022-09-07 NOTE — Telephone Encounter (Signed)
Requested Prescriptions  Pending Prescriptions Disp Refills   simvastatin (ZOCOR) 20 MG tablet [Pharmacy Med Name: SIMVASTATIN 20 MG TABLET] 90 tablet 0    Sig: Take 1 tablet (20 mg total) by mouth every morning.     Cardiovascular:  Antilipid - Statins Failed - 09/07/2022  7:39 AM      Failed - Lipid Panel in normal range within the last 12 months    Cholesterol, Total  Date Value Ref Range Status  02/22/2022 188 100 - 199 mg/dL Final   LDL Chol Calc (NIH)  Date Value Ref Range Status  02/22/2022 115 (H) 0 - 99 mg/dL Final   HDL  Date Value Ref Range Status  02/22/2022 58 >39 mg/dL Final   Triglycerides  Date Value Ref Range Status  02/22/2022 79 0 - 149 mg/dL Final         Passed - Patient is not pregnant      Passed - Valid encounter within last 12 months    Recent Outpatient Visits           2 weeks ago Prediabetes   [place] [facility]   6 months ago Essential hypertension   [place] [facility]   12 months ago Prediabetes   [facility]   1 year ago Prediabetes   [facility]   1 year ago Mild memory disturbance   [facility]       Future Appointments             In 2 months Duanne Limerick, MD [facility] Primary [facility] & [facility] Medicine at [facility]   In 3 months Duanne Limerick, MD [facility] Health Primary Care & Sports Medicine at [facility]   In 5 months Carman Ching, PA-C [facility] [place]   In 11 months Stoioff, Verna Czech, MD [facility] [place]

## 2022-09-30 ENCOUNTER — Other Ambulatory Visit: Payer: Self-pay | Admitting: Family Medicine

## 2022-09-30 DIAGNOSIS — I10 Essential (primary) hypertension: Secondary | ICD-10-CM

## 2022-09-30 NOTE — Telephone Encounter (Signed)
Requested Prescriptions  Refused Prescriptions Disp Refills   losartan-hydrochlorothiazide (HYZAAR) 50-12.5 MG tablet [Pharmacy Med Name: LOSARTAN-HCTZ 50-12.5 MG TAB] 90 tablet 0    Sig: Take 1 tablet by mouth daily.     Cardiovascular: ARB + Diuretic Combos Passed - 09/30/2022 11:21 AM      Passed - K in normal range and within 180 days    Potassium  Date Value Ref Range Status  07/22/2022 3.5 3.5 - 5.1 mmol/L Final         Passed - Na in normal range and within 180 days    Sodium  Date Value Ref Range Status  07/22/2022 142 135 - 145 mmol/L Final  04/12/2021 140 134 - 144 mmol/L Final         Passed - Cr in normal range and within 180 days    Creatinine, Ser  Date Value Ref Range Status  07/22/2022 1.10 0.61 - 1.24 mg/dL Final         Passed - eGFR is 10 or above and within 180 days    GFR calc Af Amer  Date Value Ref Range Status  01/21/2020 55 (L) >60 mL/min Final   GFR, Estimated  Date Value Ref Range Status  07/22/2022 >60 >60 mL/min Final    Comment:    (NOTE) Calculated using the CKD-EPI Creatinine Equation (2021)    eGFR  Date Value Ref Range Status  04/12/2021 44 (L) >59 mL/min/1.73 Final         Passed - Patient is not pregnant      Passed - Last BP in normal range    BP Readings from Last 1 Encounters:  08/23/22 122/60         Passed - Valid encounter within last 6 months    Recent Outpatient Visits           1 month ago Prediabetes   [place] [facility]   7 months ago Essential hypertension   [place] [facility]   1 year ago Prediabetes   [facility]   1 year ago Prediabetes   [facility]   1 year ago Mild memory disturbance   [facility] at  [facility]       Future Appointments             In 1 month Duanne Limerick, MD [facility] Primary [facility] & Sports Medicine at [facility]   In 2 months Duanne Limerick, MD [facility] & [facility] Medicine at [facility]   In 4 months Carman Ching, [facility] Urology [place]   In 10 months Stoioff, Verna Czech, MD [facility]rology [place]

## 2022-10-09 ENCOUNTER — Other Ambulatory Visit: Payer: Self-pay | Admitting: Family Medicine

## 2022-10-09 DIAGNOSIS — R413 Other amnesia: Secondary | ICD-10-CM

## 2022-10-10 NOTE — Telephone Encounter (Signed)
Refilled 08/23/22 390 with 1 refill. Requested Prescriptions  Refused Prescriptions Disp Refills   donepezil (ARICEPT) 5 MG tablet [Pharmacy Med Name: DONEPEZIL HCL 5 MG TABLET] 90 tablet 0    Sig: Take 1 tablet (5 mg total) by mouth at bedtime.     Neurology:  Alzheimer's Agents Passed - 10/09/2022 12:36 PM      Passed - Valid encounter within last 6 months    Recent Outpatient Visits           1 month ago Prediabetes   [place] [facility]   7 months ago Essential hypertension   [place] [facility]   1 year ago Prediabetes   [facility]   1 year ago Prediabetes   [facility]   1 year ago Mild memory disturbance   [facility]       Future Appointments             In 1 month Duanne Limerick, MD [facility] Primary Care & Sports Medicine at [facility]   In 2 months Duanne Limerick, MD [facility] Health Primary Care & Sports Medicine at [facility]   In 4 months Carman Ching, [facility] Urology [place]   In 10 months Stoioff, Verna Czech, MD [facility]

## 2022-10-21 ENCOUNTER — Inpatient Hospital Stay: Payer: 59 | Attending: Oncology

## 2022-10-21 DIAGNOSIS — C7951 Secondary malignant neoplasm of bone: Secondary | ICD-10-CM | POA: Insufficient documentation

## 2022-10-21 DIAGNOSIS — C61 Malignant neoplasm of prostate: Secondary | ICD-10-CM | POA: Insufficient documentation

## 2022-10-21 LAB — PSA: Prostatic Specific Antigen: 3.52 ng/mL (ref 0.00–4.00)

## 2022-10-21 LAB — CBC WITH DIFFERENTIAL/PLATELET
Abs Immature Granulocytes: 0.03 10*3/uL (ref 0.00–0.07)
Basophils Absolute: 0.1 10*3/uL (ref 0.0–0.1)
Basophils Relative: 1 %
Eosinophils Absolute: 0.2 10*3/uL (ref 0.0–0.5)
Eosinophils Relative: 3 %
HCT: 36.5 % — ABNORMAL LOW (ref 39.0–52.0)
Hemoglobin: 12 g/dL — ABNORMAL LOW (ref 13.0–17.0)
Immature Granulocytes: 1 %
Lymphocytes Relative: 34 %
Lymphs Abs: 2.1 10*3/uL (ref 0.7–4.0)
MCH: 30.8 pg (ref 26.0–34.0)
MCHC: 32.9 g/dL (ref 30.0–36.0)
MCV: 93.6 fL (ref 80.0–100.0)
Monocytes Absolute: 0.5 10*3/uL (ref 0.1–1.0)
Monocytes Relative: 8 %
Neutro Abs: 3.3 10*3/uL (ref 1.7–7.7)
Neutrophils Relative %: 53 %
Platelets: 280 10*3/uL (ref 150–400)
RBC: 3.9 MIL/uL — ABNORMAL LOW (ref 4.22–5.81)
RDW: 12.5 % (ref 11.5–15.5)
WBC: 6.2 10*3/uL (ref 4.0–10.5)
nRBC: 0 % (ref 0.0–0.2)

## 2022-11-10 ENCOUNTER — Encounter: Payer: Self-pay | Admitting: Family Medicine

## 2022-11-10 ENCOUNTER — Ambulatory Visit (INDEPENDENT_AMBULATORY_CARE_PROVIDER_SITE_OTHER): Payer: 59 | Admitting: Family Medicine

## 2022-11-10 DIAGNOSIS — I10 Essential (primary) hypertension: Secondary | ICD-10-CM

## 2022-11-10 DIAGNOSIS — R7303 Prediabetes: Secondary | ICD-10-CM

## 2022-11-10 MED ORDER — VALSARTAN 40 MG PO TABS: 40.0000 mg | ORAL_TABLET | Freq: Every day | ORAL | 1 refills | Status: DC

## 2022-11-10 NOTE — Progress Notes (Signed)
Date:  11/10/2022   Name:  Brandon Dillon.   DOB:  [birth year]   MRN:  [id number]   Chief Complaint: Hypertension  Hypertension This is a chronic problem. The current episode started more than 1 year ago. The problem has been gradually improving since onset. The problem is controlled. Pertinent negatives include no blurred vision, chest pain, headaches, palpitations, PND or shortness of breath. There are no associated agents to hypertension. There are no known risk factors for coronary artery disease. Past treatments include angiotensin blockers and diuretics. The current treatment provides moderate improvement. There are no compliance problems.  There is no history of angina, kidney disease, CAD/MI, CVA, heart failure, left ventricular hypertrophy, PVD or retinopathy. There is no history of chronic renal disease, a hypertension causing med or renovascular disease.    Lab Results  Component Value Date   NA 142 07/22/2022   K 3.5 07/22/2022   CO2 26 07/22/2022   GLUCOSE 115 (H) 07/22/2022   BUN 22 07/22/2022   CREATININE 1.10 07/22/2022   CALCIUM 9.1 07/22/2022   EGFR 44 (L) 04/12/2021   GFRNONAA >60 07/22/2022   Lab Results  Component Value Date   CHOL 188 02/22/2022   HDL 58 02/22/2022   LDLCALC 115 (H) 02/22/2022   TRIG 79 02/22/2022   CHOLHDL 3.2 04/04/2018   No results found for: "TSH" Lab Results  Component Value Date   HGBA1C 5.6 08/23/2022   Lab Results  Component Value Date   WBC 6.2 10/21/2022   HGB 12.0 (L) 10/21/2022   HCT 36.5 (L) 10/21/2022   MCV 93.6 10/21/2022   PLT 280 10/21/2022   Lab Results  Component Value Date   ALT 9 07/22/2022   AST 17 07/22/2022   ALKPHOS 118 07/22/2022   BILITOT 1.0 07/22/2022   No results found for: "25OHVITD2", "25OHVITD3", "VD25OH"   Review of Systems  Constitutional:  Negative for chills and fever.  HENT:  Negative for drooling, ear discharge, ear pain and sore throat.   Eyes:  Negative for blurred vision.   Respiratory:  Negative for cough, chest tightness, shortness of breath and wheezing.   Cardiovascular:  Negative for chest pain, palpitations, leg swelling and PND.  Gastrointestinal:  Negative for abdominal pain.  Genitourinary:  Negative for difficulty urinating, frequency, hematuria and urgency.  Skin:  Negative for rash.  Neurological:  Negative for dizziness and headaches.  Psychiatric/Behavioral:  Negative for suicidal ideas. The patient is not nervous/anxious.     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   Punctal stenosis, acquired, left 07/06/2021   Senile ectropion of both lower eyelids 07/06/2021   Idiopathic peripheral neuropathy 08/12/2020   Prediabetes 08/12/2020   Goals of care, counseling/discussion 08/21/2019   Leg pain 08/16/2018   PAD (peripheral artery disease) (HCC) 08/16/2018   DJD (degenerative joint disease) 08/16/2018   Essential hypertension 05/11/2015   Primary prostate adenocarcinoma (HCC) 05/11/2015   Hyperlipidemia 05/11/2015   Prostate hypertrophy 05/11/2015   Taking multiple medications for chronic disease 05/11/2015   Benign prostatic hyperplasia with urinary obstruction 12/07/2014   Elevated prostate specific antigen (PSA) 12/04/2014    Allergies  Allergen Reactions   Lisinopril     coughing coughing    Past Surgical History:  Procedure Laterality Date   CATARACT EXTRACTION W/PHACO Right 09/27/2017   Procedure: CATARACT EXTRACTION PHACO AND INTRAOCULAR LENS PLACEMENT (IOC)  RIGHT;  Surgeon: Lockie Mola, MD;  Location: [facility];  Service: Ophthalmology;  Laterality: Right;  IVA TOPICAL DIABETES   CATARACT EXTRACTION W/PHACO Left 10/18/2017   Procedure: CATARACT EXTRACTION PHACO AND INTRAOCULAR LENS PLACEMENT (IOC)  LEFT DIABETIC;  Surgeon: Lockie Mola, MD;  Location: [facility];  Service: Ophthalmology;  Laterality: Left;  DIABETIC-ORAL MED   COLONOSCOPY  2011   normal- Dr?    Social History   Tobacco Use   Smoking status: Never   Smokeless tobacco: Never  Vaping Use   Vaping status: Never Used  Substance Use Topics   Alcohol use: No    Alcohol/week: 0.0 standard drinks of alcohol   Drug use: No     Medication list has been reviewed and updated.  Current Meds  Medication Sig   aspirin 81 MG tablet Take 81 mg by mouth daily.   donepezil (ARICEPT) 5 MG tablet Take 1 tablet (5 mg total) by mouth at bedtime.   gabapentin (NEURONTIN) 100 MG capsule Take 1 capsule (100 mg total) by mouth at bedtime.   GE100 BLOOD GLUCOSE TEST test strip CHECK BLOOD SUGAR ONCE DAILY OR AS DIRECTED   Lancets (FREESTYLE) lancets AS DIRECTED   simvastatin (ZOCOR) 20 MG tablet Take 1 tablet (20 mg total) by mouth every morning.   valsartan (DIOVAN) 40 MG tablet Take 1 tablet (40 mg total) by mouth daily.       11/10/2022    2:04 PM 08/23/2022   11:06 AM 02/22/2022    9:57 AM 09/09/2021   10:06 AM  GAD 7 : Generalized Anxiety Score  Nervous, Anxious, on Edge 0 0 0 0  Control/stop worrying 0 0 0 0  Worry too much - different things 0 0 0 0  Trouble relaxing 0 0 0 0  Restless 0 0 0 0  Easily annoyed or irritable 0 0 0 0  Afraid - awful might happen 0 0 0 0  Total GAD 7 Score 0 0 0 0  Anxiety Difficulty Not difficult at all Not difficult at all Not difficult at all Not difficult at all       11/10/2022    2:04 PM 08/23/2022   11:06 AM 02/22/2022    9:57 AM  Depression screen PHQ 2/9  Decreased Interest 0 0 0  Down, Depressed, Hopeless 0 0 0  PHQ - 2 Score 0 0 0  Altered sleeping 0 0 0  Tired, decreased energy 0 0 0  Change in appetite 0 0 0  Feeling bad or failure about yourself  0 0 0  Trouble concentrating 0 0 0  Moving slowly or fidgety/restless 0 0 0  Suicidal thoughts 0 0 0  PHQ-9 Score 0 0 0  Difficult doing work/chores Not difficult at all Not  difficult at all Not difficult at all    BP Readings from Last 3 Encounters:  11/10/22 120/62  08/23/22 122/60  08/18/22 130/63    Physical Exam Vitals and nursing note reviewed.  HENT:     Head: Normocephalic.     Right Ear: Tympanic membrane and ear canal normal.     Left Ear: Tympanic membrane and ear canal normal.     Nose: Nose normal. No congestion.     Mouth/Throat:     Mouth: Mucous membranes are moist.     Pharynx: No oropharyngeal exudate or posterior oropharyngeal erythema.  Eyes:     Pupils: Pupils are equal, round, and reactive to light.  Cardiovascular:  Heart sounds: No murmur heard.    No friction rub. No gallop.  Pulmonary:     Breath sounds: No wheezing, rhonchi or rales.  Abdominal:     Tenderness: There is no abdominal tenderness.  Musculoskeletal:     Cervical back: Neck supple.  Neurological:     General: No focal deficit present.     Mental Status: He is alert.     Wt Readings from Last 3 Encounters:  11/10/22 219 lb (99.3 kg)  08/23/22 224 lb (101.6 kg)  08/11/22 225 lb (102.1 kg)    BP 120/62   Pulse 68   Ht 6\' 2"  (1.88 m)   Wt 219 lb (99.3 kg)   SpO2 96%   BMI 28.12 kg/m   Assessment and Plan:  1. Essential hypertension Chronic.  Controlled.  Stable.  Continue valsartan 40 mg once a day. - valsartan (DIOVAN) 40 MG tablet; Take 1 tablet (40 mg total) by mouth daily.  Dispense: 90 tablet; Refill: 1  2. Prediabetes Chronic.  Controlled.  Stable.  Reemphasize avoidance of concentrated sweets and carbohydrates.  Will continue with valsartan to assist with development of diabetic nephropathy. - valsartan (DIOVAN) 40 MG tablet; Take 1 tablet (40 mg total) by mouth daily.  Dispense: 90 tablet; Refill: 1    Elizabeth Sauer, MD

## 2022-11-17 ENCOUNTER — Telehealth: Payer: Self-pay

## 2022-11-17 NOTE — Telephone Encounter (Signed)
Called pt and asked if he was getting diabetic supplies from local pharm or does he get it mail order. I received fax from [facility] for pt's diabetic supplies- she said he does not want those and will get them locally

## 2022-12-04 ENCOUNTER — Other Ambulatory Visit: Payer: Self-pay | Admitting: Family Medicine

## 2022-12-04 DIAGNOSIS — E782 Mixed hyperlipidemia: Secondary | ICD-10-CM

## 2022-12-06 NOTE — Telephone Encounter (Signed)
Requested Prescriptions  Pending Prescriptions Disp Refills   simvastatin (ZOCOR) 20 MG tablet [Pharmacy Med Name: SIMVASTATIN 20 MG TABLET] 90 tablet 0    Sig: Take 1 tablet (20 mg total) by mouth every morning.     Cardiovascular:  Antilipid - Statins Failed - 12/04/2022 10:09 AM      Failed - Lipid Panel in normal range within the last 12 months    Cholesterol, Total  Date Value Ref Range Status  02/22/2022 188 100 - 199 mg/dL Final   LDL Chol Calc (NIH)  Date Value Ref Range Status  02/22/2022 115 (H) 0 - 99 mg/dL Final   HDL  Date Value Ref Range Status  02/22/2022 58 >39 mg/dL Final   Triglycerides  Date Value Ref Range Status  02/22/2022 79 0 - 149 mg/dL Final         Passed - Patient is not pregnant      Passed - Valid encounter within last 12 months    Recent Outpatient Visits           3 weeks ago Essential hypertension   [facility]   3 months ago Prediabetes   [facility]   9 months ago Essential hypertension   [place] [facility]   1 year ago Prediabetes   [facility]   1 year ago Prediabetes   [facility]       Future Appointments             In 2 months Carman Ching, PA-C [facility] [place]   In 5 months Duanne Limerick, MD [facility] at [facility]   In 8 months Stoioff, Verna Czech, MD [facility] [place]

## 2022-12-07 ENCOUNTER — Ambulatory Visit (INDEPENDENT_AMBULATORY_CARE_PROVIDER_SITE_OTHER): Payer: 59

## 2022-12-07 VITALS — BP 120/60 | Ht 74.0 in | Wt 219.0 lb

## 2022-12-07 DIAGNOSIS — Z0000 Encounter for general adult medical examination without abnormal findings: Secondary | ICD-10-CM | POA: Diagnosis not present

## 2022-12-07 NOTE — Patient Instructions (Signed)
Brandon Dillon , Thank you for taking time to come for your Medicare Wellness Visit. I appreciate your ongoing commitment to your health goals. Please review the following plan we discussed and let me know if I can assist you in the future.   These are the goals we discussed:  Goals      DIET - INCREASE WATER INTAKE     Recommend drinking 6-8 glasses of water per day     Exercise 3x per week (30 min per time)     Exercise more        This is a list of the screening recommended for you and due dates:  Health Maintenance  Topic Date Due   COVID-19 Vaccine (4 - 2023-24 season) 12/23/2022*   Zoster (Shingles) Vaccine (2 of 2) 02/10/2023*   Flu Shot  07/24/2023*   Eye exam for diabetics  12/09/2022   Hemoglobin A1C  02/22/2023   Complete foot exam   11/10/2023   Medicare Annual Wellness Visit  12/07/2023   Pneumonia Vaccine  Completed   HPV Vaccine  Aged Out   DTaP/Tdap/Td vaccine  Discontinued  *Topic was postponed. The date shown is not the original due date.   Health Maintenance After Age 70 After age 73, you are at a higher risk for certain long-term diseases and infections as well as injuries from falls. Falls are a major cause of broken bones and head injuries in people who are older than age 37. Getting regular preventive care can help to keep you healthy and well. Preventive care includes getting regular testing and making lifestyle changes as recommended by your health care provider. Talk with your health care provider about: Which screenings and tests you should have. A screening is a test that checks for a disease when you have no symptoms. A diet and exercise plan that is right for you. What should I know about screenings and tests to prevent falls? Screening and testing are the best ways to find a health problem early. Early diagnosis and treatment give you the best chance of managing medical conditions that are common after age 57. Certain conditions and lifestyle choices  may make you more likely to have a fall. Your health care provider may recommend: Regular vision checks. Poor vision and conditions such as cataracts can make you more likely to have a fall. If you wear glasses, make sure to get your prescription updated if your vision changes. Medicine review. Work with your health care provider to regularly review all of the medicines you are taking, including over-the-counter medicines. Ask your health care provider about any side effects that may make you more likely to have a fall. Tell your health care provider if any medicines that you take make you feel dizzy or sleepy. Strength and balance checks. Your health care provider may recommend certain tests to check your strength and balance while standing, walking, or changing positions. Foot health exam. Foot pain and numbness, as well as not wearing proper footwear, can make you more likely to have a fall. Screenings, including: Osteoporosis screening. Osteoporosis is a condition that causes the bones to get weaker and break more easily. Blood pressure screening. Blood pressure changes and medicines to control blood pressure can make you feel dizzy. Depression screening. You may be more likely to have a fall if you have a fear of falling, feel depressed, or feel unable to do activities that you used to do. Alcohol use screening. Using too much alcohol can affect your  balance and may make you more likely to have a fall. Follow these instructions at home: Lifestyle Do not drink alcohol if: Your health care provider tells you not to drink. If you drink alcohol: Limit how much you have to: 0-1 drink a day for women. 0-2 drinks a day for men. Know how much alcohol is in your drink. In the [place] one drink equals one 12 oz bottle of beer (355 mL), one 5 oz glass of wine (148 mL), or one 1 oz glass of hard liquor (44 mL). Do not use any products that contain nicotine or tobacco. These products include cigarettes,  chewing tobacco, and vaping devices, such as e-cigarettes. If you need help quitting, ask your health care provider. Activity  Follow a regular exercise program to stay fit. This will help you maintain your balance. Ask your health care provider what types of exercise are appropriate for you. If you need a cane or walker, use it as recommended by your health care provider. Wear supportive shoes that have nonskid soles. Safety  Remove any tripping hazards, such as rugs, cords, and clutter. Install safety equipment such as grab bars in bathrooms and safety rails on stairs. Keep rooms and walkways well-lit. General instructions Talk with your health care provider about your risks for falling. Tell your health care provider if: You fall. Be sure to tell your health care provider about all falls, even ones that seem minor. You feel dizzy, tiredness (fatigue), or off-balance. Take over-the-counter and prescription medicines only as told by your health care provider. These include supplements. Eat a healthy diet and maintain a healthy weight. A healthy diet includes low-fat dairy products, low-fat (lean) meats, and fiber from whole grains, beans, and lots of fruits and vegetables. Stay current with your vaccines. Schedule regular health, dental, and eye exams. Summary Having a healthy lifestyle and getting preventive care can help to protect your health and wellness after age 41. Screening and testing are the best way to find a health problem early and help you avoid having a fall. Early diagnosis and treatment give you the best chance for managing medical conditions that are more common for people who are older than age 43. Falls are a major cause of broken bones and head injuries in people who are older than age 29. Take precautions to prevent a fall at home. Work with your health care provider to learn what changes you can make to improve your health and wellness and to prevent falls. This  information is not intended to replace advice given to you by your health care provider. Make sure you discuss any questions you have with your health care provider. Document Revised: 08/31/2020 Document Reviewed: 08/31/2020 Elsevier Patient Education  2024 [employer].

## 2022-12-07 NOTE — Progress Notes (Signed)
Subjective:   Brandon Dillon. is a 89 y.o. male who presents for Medicare Annual/Subsequent preventive examination.  Visit Complete: Virtual  I connected with  Len Childs. on 12/07/22 by a audio enabled telemedicine application and verified that I am speaking with the correct person using two identifiers.  Patient Location: Home  Provider Location: Office/Clinic  I discussed the limitations of evaluation and management by telemedicine. The patient expressed understanding and agreed to proceed.   Cardiac Risk Factors include: advanced age (>65men, >70 women);diabetes mellitus;dyslipidemia;hypertension;male gender     Objective:    Today's Vitals   12/07/22 0831  BP: 120/60  Weight: 219 lb (99.3 kg)  Height: 6\' 2"  (1.88 m)   Body mass index is 28.12 kg/m.     12/07/2022    8:40 AM 07/22/2022    9:54 AM 01/21/2022   11:02 AM 08/30/2021   11:27 AM 07/21/2021    2:40 PM 01/21/2021   11:13 AM 08/26/2020   11:16 AM  Advanced Directives  Does Patient Have a Medical Advance Directive? Yes Yes No Yes No Yes Yes  Type of Estate agent of [place];Living will Healthcare Power of [place];Living will  Healthcare Power of [place];Living will  Living will;Healthcare Power of [employer] Power of [place];Living will  Does patient want to make changes to medical advance directive? No - Patient declined     Yes (ED - Information included in AVS)   Copy of Healthcare Power of Attorney in Chart? No - copy requested   No - copy requested   No - copy requested  Would patient like information on creating a medical advance directive?   No - Patient declined  No - Patient declined Yes (ED - Information included in AVS)     Current Medications (verified) Outpatient Encounter Medications as of 12/07/2022  Medication Sig   aspirin 81 MG tablet Take 81 mg by mouth daily.   donepezil (ARICEPT) 5 MG tablet Take 1 tablet (5 mg total) by mouth at bedtime.   gabapentin  (NEURONTIN) 100 MG capsule Take 1 capsule (100 mg total) by mouth at bedtime.   GE100 BLOOD GLUCOSE TEST test strip CHECK BLOOD SUGAR ONCE DAILY OR AS DIRECTED   Lancets (FREESTYLE) lancets AS DIRECTED   simvastatin (ZOCOR) 20 MG tablet Take 1 tablet (20 mg total) by mouth every morning.   valsartan (DIOVAN) 40 MG tablet Take 1 tablet (40 mg total) by mouth daily.   No facility-administered encounter medications on file as of 12/07/2022.    Allergies (verified) Lisinopril   History: Past Medical History:  Diagnosis Date   Diabetes mellitus without complication (HCC)    Hearing loss    Hx of fracture of wrist 07/20/2017   >30 years ago. <1989   Hyperlipidemia    Hypertension    Prostate cancer [facility])    Past Surgical History:  Procedure Laterality Date   CATARACT EXTRACTION W/PHACO Right 09/27/2017   Procedure: CATARACT EXTRACTION PHACO AND INTRAOCULAR LENS PLACEMENT (IOC) RIGHT;  Surgeon: Lockie Mola, MD;  Location: [facility];  Service: Ophthalmology;  Laterality: Right;  IVA TOPICAL DIABETES   CATARACT EXTRACTION W/PHACO Left 10/18/2017   Procedure: CATARACT EXTRACTION PHACO AND INTRAOCULAR LENS PLACEMENT (IOC)  LEFT DIABETIC;  Surgeon: Lockie Mola, MD;  Location: [facility];  Service: Ophthalmology;  Laterality: Left;  DIABETIC-ORAL MED   COLONOSCOPY  2011   normal- Dr?   Family History  Family history unknown: Yes   Social History  Socioeconomic History   Marital status: Married    Spouse name: Not on file   Number of children: Not on file   Years of education: Not on file   Highest education level: Not on file  Occupational History   Not on file  Tobacco Use   Smoking status: Never   Smokeless tobacco: Never  Vaping Use   Vaping status: Never Used  Substance and Sexual Activity   Alcohol use: No    Alcohol/week: 0.0 standard drinks of alcohol   Drug use: No   Sexual activity: Yes  Other Topics Concern   Not on file  Social  History Narrative   Pt lives with wife.    Social Determinants of Health   Financial Resource Strain: Low Risk  (12/07/2022)   Overall Financial Resource Strain (CARDIA)    Difficulty of Paying Living Expenses: Not hard at all  Food Insecurity: No Food Insecurity (12/07/2022)   Hunger Vital Sign    Worried About Running Out of Food in the Last Year: Never true    Ran Out of Food in the Last Year: Never true  Transportation Needs: No Transportation Needs (12/07/2022)   PRAPARE - Administrator, Civil Service (Medical): No    Lack of Transportation (Non-Medical): No  Physical Activity: Insufficiently Active (12/07/2022)   Exercise Vital Sign    Days of Exercise per Week: 3 days    Minutes of Exercise per Session: 30 min  Stress: No Stress Concern Present (12/07/2022)   [employer] of Occupational Health - Occupational Stress Questionnaire    Feeling of Stress : Not at all  Social Connections: Moderately Integrated (12/07/2022)   Social Connection and Isolation Panel [NHANES]    Frequency of Communication with Friends and Family: More than three times a week    Frequency of Social Gatherings with Friends and Family: Twice a week    Attends Religious Services: More than 4 times per year    Active Member of [employer] or Organizations: No    Attends Engineer, structural: Never    Marital Status: Married    Tobacco Counseling Counseling given: Yes   Clinical Intake:  Pre-visit preparation completed: No  Pain : No/denies pain     BMI - recorded: 28.12 Nutritional Status: BMI 25 -29 Overweight Nutritional Risks: None Diabetes: Yes (prediabetes) Did pt. bring in CBG monitor from home?: No  How often do you need to have someone help you when you read instructions, pamphlets, or other written materials from your doctor or pharmacy?: 2 - Rarely  Interpreter Needed?: No  Information entered by :: Arthur Holms   Activities of Daily Living    12/07/2022     8:34 AM  In your present state of health, do you have any difficulty performing the following activities:  Hearing? 0  Vision? 0  Comment wears readers  Difficulty concentrating or making decisions? 1  Walking or climbing stairs? 0  Dressing or bathing? 0  Doing errands, shopping? 1  Comment family brings him to appts  Preparing Food and eating ? N  Using the Toilet? N  In the past six months, have you accidently leaked urine? N  Do you have problems with loss of bowel control? N  Managing your Medications? N  Managing your Finances? Y  Housekeeping or managing your Housekeeping? N    Patient Care Team: Duanne Limerick, MD as PCP - General (Family Medicine) Creig Hines, MD as Consulting Physician (Hematology and  Oncology) Riki Altes, MD (Urology) Gilda Crease, Latina Craver, MD (Vascular Surgery)  Indicate any recent Medical Services you may have received from other than [facility] providers in the past year (date may be approximate).     Assessment:   This is a routine wellness examination for [employer].  Hearing/Vision screen Hearing Screening - Comments:: Hearing well over phone Vision Screening - Comments:: Dr Inez Pilgrim  Dietary issues and exercise activities discussed:     Goals Addressed             This Visit's Progress    Exercise 3x per week (30 min per time)       Exercise more       Depression Screen    12/07/2022    8:39 AM 11/10/2022    2:04 PM 08/23/2022   11:06 AM 02/22/2022    9:57 AM 09/09/2021   10:06 AM 08/30/2021   11:27 AM 02/15/2021   10:14 AM  PHQ 2/9 Scores  PHQ - 2 Score 0 0 0 0 0 0 0  PHQ- 9 Score 0 0 0 0 0  0    Fall Risk    12/07/2022    8:41 AM 11/10/2022    2:04 PM 08/23/2022   11:06 AM 02/22/2022    9:57 AM 08/30/2021   11:29 AM  Fall Risk   Falls in the past year? 0 0 0 0 0  Number falls in past yr: 0 0 0 0 0  Injury with Fall? 0 0 0 0 0  Risk for fall due to : No Fall Risks Impaired balance/gait No Fall Risks;Impaired  balance/gait History of fall(s) No Fall Risks  Follow up Falls evaluation completed Falls evaluation completed;Falls prevention discussed Falls evaluation completed Falls evaluation completed;Falls prevention discussed Falls prevention discussed    MEDICARE RISK AT HOME:  Medicare Risk at Home - 12/07/22 0842     Any stairs in or around the home? Yes    If so, are there any without handrails? No    Home free of loose throw rugs in walkways, pet beds, electrical cords, etc? Yes    Adequate lighting in your home to reduce risk of falls? Yes    Life alert? Yes    Use of a cane, walker or w/c? Yes    Grab bars in the bathroom? Yes    Shower chair or bench in shower? Yes    Elevated toilet seat or a handicapped toilet? Yes               Cognitive Function:        12/07/2022    8:43 AM 12/14/2020   10:15 AM 08/26/2019   11:38 AM  6CIT Screen  What Year? 0 points 0 points 0 points  What month? 0 points 0 points 0 points  What time? 0 points 0 points 0 points  Count back from 20 2 points 0 points 0 points  Months in reverse 4 points 2 points 0 points  Repeat phrase 4 points 8 points 6 points  Total Score 10 points 10 points 6 points    Immunizations Immunization History  Administered Date(s) Administered   Fluad Quad(high Dose 65+) 01/15/2019, 01/29/2020, 02/22/2022   Influenza,inj,Quad PF,6+ Mos 02/09/2017   Influenza,inj,quad, With Preservative 01/30/2018   Influenza-Unspecified 01/24/2015, 01/26/2018, 02/02/2021   Moderna Sars-Covid-2 Vaccination 05/31/2019, 06/28/2019, 03/23/2020   Pneumococcal Conjugate-13 11/02/2017   Pneumococcal Polysaccharide-23 01/15/2019   Tdap 05/14/2004   Zoster Recombinant(Shingrix) 08/02/2019   Zoster, Live 05/11/2015  TDAP status: Due, Education has been provided regarding the importance of this vaccine. Advised may receive this vaccine at local pharmacy or Health Dept. Aware to provide a copy of the vaccination record if obtained from  local pharmacy or Health Dept. Verbalized acceptance and understanding.  Flu Vaccine status: Up to date  Pneumococcal vaccine status: Due, Education has been provided regarding the importance of this vaccine. Advised may receive this vaccine at local pharmacy or Health Dept. Aware to provide a copy of the vaccination record if obtained from local pharmacy or Health Dept. Verbalized acceptance and understanding.  Covid-19 vaccine status: Completed vaccines  Qualifies for Shingles Vaccine? No   Zostavax completed Yes   Shingrix Completed?: Yes  Screening Tests Health Maintenance  Topic Date Due   COVID-19 Vaccine (4 - 2023-24 season) 12/23/2022 (Originally 12/24/2021)   Zoster Vaccines- Shingrix (2 of 2) 02/10/2023 (Originally 09/27/2019)   INFLUENZA VACCINE  07/24/2023 (Originally 11/24/2022)   OPHTHALMOLOGY EXAM  12/09/2022   HEMOGLOBIN A1C  02/22/2023   FOOT EXAM  11/10/2023   Medicare Annual Wellness (AWV)  12/07/2023   Pneumonia Vaccine 7+ Years old  Completed   HPV VACCINES  Aged Out   DTaP/Tdap/Td  Discontinued    Health Maintenance  There are no preventive care reminders to display for this patient.   Colorectal cancer screening: Type of screening: Colonoscopy. Completed unsure of date. Repeat every aged out years  Lung Cancer Screening: (Low Dose CT Chest recommended if Age 52-80 years, 20 pack-year currently smoking OR have quit w/in 15years.) does not qualify.    Additional Screening:  Hepatitis C Screening: does qualify; Completed does not want  Vision Screening: Recommended annual ophthalmology exams for early detection of glaucoma and other disorders of the eye. Is the patient up to date with their annual eye exam?  Yes  Who is the provider or what is the name of the office in which the patient attends annual eye exams? Dr Inez Pilgrim  Dental Screening: Recommended annual dental exams for proper oral hygiene- trying to get in  Diabetic Foot Exam: Diabetic Foot  Exam: Completed 11/10/22  Community Resource Referral / Chronic Care Management: CRR required this visit?  No   CCM required this visit?  No     Plan:     I have personally reviewed and noted the following in the patient's chart:   Medical and social history Use of alcohol, tobacco or illicit drugs  Current medications and supplements including opioid prescriptions. Patient is not currently taking opioid prescriptions. Functional ability and status Nutritional status Physical activity Advanced directives List of other physicians Hospitalizations, surgeries, and ER visits in previous 12 months Vitals Screenings to include cognitive, depression, and falls Referrals and appointments  In addition, I have reviewed and discussed with patient certain preventive protocols, quality metrics, and best practice recommendations. A written personalized care plan for preventive services as well as general preventive health recommendations were provided to patient.     Everitt Amber   12/07/2022   After Visit Summary: (Declined) Due to this being a telephonic visit, with patients personalized plan was offered to patient but patient Declined AVS at this time   Nurse Notes: none needed

## 2022-12-23 ENCOUNTER — Ambulatory Visit: Payer: 59 | Admitting: Family Medicine

## 2022-12-30 ENCOUNTER — Telehealth: Payer: Self-pay | Admitting: Family Medicine

## 2022-12-30 NOTE — Telephone Encounter (Signed)
Medication Refill - Medication:  Losartan HCTZ  been out for over a week   Has the patient contacted their pharmacy? Yes.  Advised to contact PCP  Preferred Pharmacy (with phone number or street name):  [facility] - [place], [place] - [address] Phone: [phone] Fax: [phone]   Has the patient been seen for an appointment in the last year OR does the patient have an upcoming appointment? Yes.

## 2022-12-30 NOTE — Telephone Encounter (Signed)
Patient's wife called on the cell number listed, the person who answered said this is the wrong number.

## 2022-12-30 NOTE — Telephone Encounter (Signed)
Patient's wife called per DPR on cell number, left VM to return the call to the office. Will need to let her know that this medication requested was discontinued on 08/23/22 and changed to valsartan at that time. See the OV notes for the reason.

## 2023-01-20 ENCOUNTER — Other Ambulatory Visit: Payer: Self-pay

## 2023-01-20 DIAGNOSIS — C61 Malignant neoplasm of prostate: Secondary | ICD-10-CM

## 2023-01-23 ENCOUNTER — Encounter: Payer: Self-pay | Admitting: Oncology

## 2023-01-23 ENCOUNTER — Inpatient Hospital Stay (HOSPITAL_BASED_OUTPATIENT_CLINIC_OR_DEPARTMENT_OTHER): Payer: 59 | Admitting: Oncology

## 2023-01-23 ENCOUNTER — Inpatient Hospital Stay: Payer: 59 | Attending: Oncology

## 2023-01-23 VITALS — BP 176/86 | HR 75 | Temp 95.6°F | Resp 18 | Ht 74.0 in | Wt 212.2 lb

## 2023-01-23 DIAGNOSIS — Z5181 Encounter for therapeutic drug level monitoring: Secondary | ICD-10-CM | POA: Diagnosis not present

## 2023-01-23 DIAGNOSIS — Z79899 Other long term (current) drug therapy: Secondary | ICD-10-CM | POA: Diagnosis not present

## 2023-01-23 DIAGNOSIS — C61 Malignant neoplasm of prostate: Secondary | ICD-10-CM | POA: Insufficient documentation

## 2023-01-23 DIAGNOSIS — C7951 Secondary malignant neoplasm of bone: Secondary | ICD-10-CM | POA: Diagnosis not present

## 2023-01-23 DIAGNOSIS — Z79818 Long term (current) use of other agents affecting estrogen receptors and estrogen levels: Secondary | ICD-10-CM

## 2023-01-23 LAB — CBC WITH DIFFERENTIAL/PLATELET
Abs Immature Granulocytes: 0.02 10*3/uL (ref 0.00–0.07)
Basophils Absolute: 0.1 10*3/uL (ref 0.0–0.1)
Basophils Relative: 1 %
Eosinophils Absolute: 0.2 10*3/uL (ref 0.0–0.5)
Eosinophils Relative: 3 %
HCT: 39.7 % (ref 39.0–52.0)
Hemoglobin: 12.5 g/dL — ABNORMAL LOW (ref 13.0–17.0)
Immature Granulocytes: 0 %
Lymphocytes Relative: 37 %
Lymphs Abs: 2.1 10*3/uL (ref 0.7–4.0)
MCH: 30 pg (ref 26.0–34.0)
MCHC: 31.5 g/dL (ref 30.0–36.0)
MCV: 95.2 fL (ref 80.0–100.0)
Monocytes Absolute: 0.5 10*3/uL (ref 0.1–1.0)
Monocytes Relative: 8 %
Neutro Abs: 2.8 10*3/uL (ref 1.7–7.7)
Neutrophils Relative %: 51 %
Platelets: 289 10*3/uL (ref 150–400)
RBC: 4.17 MIL/uL — ABNORMAL LOW (ref 4.22–5.81)
RDW: 12.6 % (ref 11.5–15.5)
WBC: 5.7 10*3/uL (ref 4.0–10.5)
nRBC: 0 % (ref 0.0–0.2)

## 2023-01-23 LAB — CMP (CANCER CENTER ONLY)
ALT: 9 U/L (ref 0–44)
AST: 13 U/L — ABNORMAL LOW (ref 15–41)
Albumin: 4.2 g/dL (ref 3.5–5.0)
Alkaline Phosphatase: 106 U/L (ref 38–126)
Anion gap: 8 (ref 5–15)
BUN: 16 mg/dL (ref 8–23)
CO2: 25 mmol/L (ref 22–32)
Calcium: 9 mg/dL (ref 8.9–10.3)
Chloride: 107 mmol/L (ref 98–111)
Creatinine: 1.19 mg/dL (ref 0.61–1.24)
GFR, Estimated: 57 mL/min — ABNORMAL LOW (ref >60)
Glucose, Bld: 110 mg/dL — ABNORMAL HIGH (ref 70–99)
Potassium: 4.3 mmol/L (ref 3.5–5.1)
Sodium: 140 mmol/L (ref 135–145)
Total Bilirubin: 0.9 mg/dL (ref 0.3–1.2)
Total Protein: 7.4 g/dL (ref 6.5–8.1)

## 2023-01-23 LAB — PSA: Prostatic Specific Antigen: 4.24 ng/mL — ABNORMAL HIGH (ref 0.00–4.00)

## 2023-01-23 NOTE — Progress Notes (Signed)
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  At that time he had staging evaluation consistent with metastatic disease to the sacrum and pelvis and was started on Lupron every 6 months.  Bone scan done on March 2019 again showed metastatic involvement of the right acetabulum and iliac wing and right sacrum.  Patient has been responding to Lupron well and his PSA went down from 6.6 in 20 16-0.5 in 2019.  It went up to 1.2 on 08/13/2019.  Patient has therefore been referred to [place] for consideration of additional treatments for prostate cancer patient is doing well for his age and remains independent of his ADLs.    He gets Lupron through urology.  He has not required any additional antiandrogen treatment so far    Interval history-he is doing well for his age.  Tolerating Lupron well so far without any significant side effects  ECOG PS- 1 Pain scale- 0   Review of systems- Review of Systems  Constitutional:  Negative for chills, fever, malaise/fatigue and weight loss.  HENT:  Negative for congestion, ear discharge and nosebleeds.   Eyes:  Negative for blurred vision.  Respiratory:  Negative for cough, hemoptysis, sputum production, shortness of breath and wheezing.   Cardiovascular:  Negative for chest pain, palpitations, orthopnea and  claudication.  Gastrointestinal:  Negative for abdominal pain, blood in stool, constipation, diarrhea, heartburn, melena, nausea and vomiting.  Genitourinary:  Negative for dysuria, flank pain, frequency, hematuria and urgency.  Musculoskeletal:  Negative for back pain, joint pain and myalgias.  Skin:  Negative for rash.  Neurological:  Negative for dizziness, tingling, focal weakness, seizures, weakness and headaches.  Endo/Heme/Allergies:  Does not bruise/bleed easily.  Psychiatric/Behavioral:  Negative for depression and suicidal ideas. The patient does not have insomnia.       Allergies  Allergen Reactions   Lisinopril     coughing coughing     Past Medical History:  Diagnosis Date   Diabetes mellitus without complication (HCC)    Hearing loss    Hx of fracture of wrist 07/20/2017   >30 years ago. <1989   Hyperlipidemia    Hypertension    Prostate cancer [facility])      Past Surgical History:  Procedure Laterality Date   CATARACT EXTRACTION W/PHACO Right 09/27/2017   Procedure: CATARACT EXTRACTION PHACO AND INTRAOCULAR LENS PLACEMENT (IOC) RIGHT;  Surgeon: Lockie Mola, MD;  Location: [facility];  Service: Ophthalmology;  Laterality: Right;  IVA TOPICAL DIABETES   CATARACT EXTRACTION W/PHACO Left 10/18/2017   Procedure: CATARACT EXTRACTION PHACO AND INTRAOCULAR LENS PLACEMENT (IOC)  LEFT DIABETIC;  Surgeon: Lockie Mola, MD;  Location: [facility];  Service: Ophthalmology;  Laterality: Left;  DIABETIC-ORAL MED   COLONOSCOPY  2011   normal- Dr?    Social History   Socioeconomic History   Marital status: Married    Spouse name: Not on file   Number  of children: Not on file   Years of education: Not on file   Highest education level: Not on file  Occupational History   Not on file  Tobacco Use   Smoking status: Never   Smokeless tobacco: Never  Vaping Use   Vaping status: Never Used  Substance and Sexual Activity   Alcohol use: No     Alcohol/week: 0.0 standard drinks of alcohol   Drug use: No   Sexual activity: Yes  Other Topics Concern   Not on file  Social History Narrative   Pt lives with wife.    Social Determinants of Health   Financial Resource Strain: Low Risk  (12/07/2022)   Overall Financial Resource Strain (CARDIA)    Difficulty of Paying Living Expenses: Not hard at all  Food Insecurity: No Food Insecurity (12/07/2022)   Hunger Vital Sign    Worried About Running Out of Food in the Last Year: Never true    Ran Out of Food in the Last Year: Never true  Transportation Needs: No Transportation Needs (12/07/2022)   PRAPARE - Administrator, Civil Service (Medical): No    Lack of Transportation (Non-Medical): No  Physical Activity: Insufficiently Active (12/07/2022)   Exercise Vital Sign    Days of Exercise per Week: 3 days    Minutes of Exercise per Session: 30 min  Stress: No Stress Concern Present (12/07/2022)   [employer] of Occupational Health - Occupational Stress Questionnaire    Feeling of Stress : Not at all  Social Connections: Moderately Integrated (12/07/2022)   Social Connection and Isolation Panel [NHANES]    Frequency of Communication with Friends and Family: More than three times a week    Frequency of Social Gatherings with Friends and Family: Twice a week    Attends Religious Services: More than 4 times per year    Active Member of [employer] or Organizations: No    Attends Banker Meetings: Never    Marital Status: Married  Catering manager Violence: Not At Risk (12/07/2022)   Humiliation, Afraid, Rape, and Kick questionnaire    Fear of Current or Ex-Partner: No    Emotionally Abused: No    Physically Abused: No    Sexually Abused: No    Family History  Family history unknown: Yes     Current Outpatient Medications:    aspirin 81 MG tablet, Take 81 mg by mouth daily., Disp: , Rfl:    donepezil (ARICEPT) 5 MG tablet, Take 1 tablet (5 mg total) by mouth  at bedtime., Disp: 90 tablet, Rfl: 1   gabapentin (NEURONTIN) 100 MG capsule, Take 1 capsule (100 mg total) by mouth at bedtime., Disp: 30 capsule, Rfl: 5   GE100 BLOOD GLUCOSE TEST test strip, CHECK BLOOD SUGAR ONCE DAILY OR AS DIRECTED, Disp: 50 strip, Rfl: 0   Lancets (FREESTYLE) lancets, AS DIRECTED, Disp: 100 each, Rfl: 0   simvastatin (ZOCOR) 20 MG tablet, Take 1 tablet (20 mg total) by mouth every morning., Disp: 90 tablet, Rfl: 0   valsartan (DIOVAN) 40 MG tablet, Take 1 tablet (40 mg total) by mouth daily., Disp: 90 tablet, Rfl: 1  Physical exam:  Vitals:   01/23/23 1039 01/23/23 1043  BP: (!) 186/86 (!) 176/86  Pulse: 75   Resp: 18   Temp: (!) 95.6 F (35.3 C)   TempSrc: Tympanic   SpO2: 99%   Weight: 212 lb 3.2 oz (96.3 kg)   Height: 6\' 2"  (1.88 m)  Physical Exam Cardiovascular:     Rate and Rhythm: Normal rate and regular rhythm.     Heart sounds: Normal heart sounds.  Pulmonary:     Effort: Pulmonary effort is normal.     Breath sounds: Normal breath sounds.  Skin:    General: Skin is warm and dry.  Neurological:     Mental Status: He is alert and oriented to person, place, and time.         Latest Ref Rng & Units 01/23/2023    9:55 AM  CMP  Glucose 70 - 99 mg/dL 161   BUN 8 - 23 mg/dL 16   Creatinine 0.96 - 1.24 mg/dL 0.45   Sodium 409 - 811 mmol/L 140   Potassium 3.5 - 5.1 mmol/L 4.3   Chloride 98 - 111 mmol/L 107   CO2 22 - 32 mmol/L 25   Calcium 8.9 - 10.3 mg/dL 9.0   Total Protein 6.5 - 8.1 g/dL 7.4   Total Bilirubin 0.3 - 1.2 mg/dL 0.9   Alkaline Phos 38 - 126 U/L 106   AST 15 - 41 U/L 13   ALT 0 - 44 U/L 9       Latest Ref Rng & Units 01/23/2023    9:56 AM  CBC  WBC 4.0 - 10.5 K/uL 5.7   Hemoglobin 13.0 - 17.0 g/dL 91.4   Hematocrit 78.2 - 52.0 % 39.7   Platelets 150 - 400 K/uL 289      Assessment and plan- Patient is a 89 y.o. male with history of metastatic castrate resistant prostate cancer currently on Lupron here for routine  follow-up  Patient is tolerating every 6 month of Lupron well without any significant sideEffects.  His PSA is stabilized around 3.2 for the last 8 months.  PSA from today is pending.  PSA doubling time remains more than 10 months and therefore additional ADT has not been added.  He gets Lupron through urology.  Repeat labs in 3 months in 7 months and I will see him back in 7 months.  Bone density scan prior.   Visit Diagnosis 1. Primary prostate adenocarcinoma (HCC)   2. Encounter for monitoring Lupron therapy      Dr. Owens Shark, MD, MPH [facility] [phone] 01/23/2023 4:02 PM

## 2023-02-07 ENCOUNTER — Encounter: Payer: Self-pay | Admitting: Physician Assistant

## 2023-02-07 ENCOUNTER — Ambulatory Visit (INDEPENDENT_AMBULATORY_CARE_PROVIDER_SITE_OTHER): Payer: 59 | Admitting: Physician Assistant

## 2023-02-07 VITALS — BP 186/77 | HR 79 | Ht 74.0 in | Wt 245.0 lb

## 2023-02-07 DIAGNOSIS — C61 Malignant neoplasm of prostate: Secondary | ICD-10-CM | POA: Diagnosis not present

## 2023-02-07 MED ORDER — LEUPROLIDE ACETATE (6 MONTH) 45 MG ~~LOC~~ KIT
45.0000 mg | PACK | Freq: Once | SUBCUTANEOUS | Status: AC
Start: 2023-02-07 — End: 2023-02-07
  Administered 2023-02-07: 45 mg via SUBCUTANEOUS

## 2023-02-07 NOTE — Progress Notes (Signed)
Eligard SubQ Injection   Due to Prostate Cancer patient is present today for a Eligard Injection.  Medication: Eligard 6 month Dose: 45 mg  Location: right  Lot: [id number] Exp: 04/13/2024  Patient tolerated well, no complications were noted  Performed by: Ples Specter CMA   Per Dr. Lonna Cobb  patient is to continue therapy for 6 months . Patient's next follow up was scheduled for 08/11/2023 . This appointment was scheduled using wheel and given to patient today along with reminder continue on Vitamin D 800-1000iu and Calcium 1000-1200mg  daily while on Androgen Deprivation Therapy.  PA approval dates:

## 2023-02-17 ENCOUNTER — Inpatient Hospital Stay: Payer: 59 | Attending: Oncology

## 2023-03-03 ENCOUNTER — Other Ambulatory Visit: Payer: Self-pay | Admitting: Family Medicine

## 2023-03-03 DIAGNOSIS — E782 Mixed hyperlipidemia: Secondary | ICD-10-CM

## 2023-04-05 ENCOUNTER — Other Ambulatory Visit: Payer: Self-pay | Admitting: Family Medicine

## 2023-04-05 DIAGNOSIS — R413 Other amnesia: Secondary | ICD-10-CM

## 2023-04-07 DIAGNOSIS — E119 Type 2 diabetes mellitus without complications: Secondary | ICD-10-CM | POA: Diagnosis not present

## 2023-04-07 DIAGNOSIS — H4321 Crystalline deposits in vitreous body, right eye: Secondary | ICD-10-CM | POA: Diagnosis not present

## 2023-04-07 DIAGNOSIS — H43813 Vitreous degeneration, bilateral: Secondary | ICD-10-CM | POA: Diagnosis not present

## 2023-04-07 LAB — HM DIABETES EYE EXAM

## 2023-04-25 ENCOUNTER — Encounter: Payer: Self-pay | Admitting: Emergency Medicine

## 2023-04-25 ENCOUNTER — Emergency Department: Payer: 59

## 2023-04-25 ENCOUNTER — Other Ambulatory Visit: Payer: Self-pay

## 2023-04-25 DIAGNOSIS — R0602 Shortness of breath: Secondary | ICD-10-CM | POA: Diagnosis not present

## 2023-04-25 DIAGNOSIS — I493 Ventricular premature depolarization: Secondary | ICD-10-CM | POA: Diagnosis not present

## 2023-04-25 DIAGNOSIS — E1142 Type 2 diabetes mellitus with diabetic polyneuropathy: Secondary | ICD-10-CM | POA: Diagnosis present

## 2023-04-25 DIAGNOSIS — Z8546 Personal history of malignant neoplasm of prostate: Secondary | ICD-10-CM

## 2023-04-25 DIAGNOSIS — Z823 Family history of stroke: Secondary | ICD-10-CM

## 2023-04-25 DIAGNOSIS — Z03818 Encounter for observation for suspected exposure to other biological agents ruled out: Secondary | ICD-10-CM | POA: Diagnosis not present

## 2023-04-25 DIAGNOSIS — I5023 Acute on chronic systolic (congestive) heart failure: Secondary | ICD-10-CM | POA: Diagnosis present

## 2023-04-25 DIAGNOSIS — I429 Cardiomyopathy, unspecified: Secondary | ICD-10-CM | POA: Diagnosis present

## 2023-04-25 DIAGNOSIS — Z79899 Other long term (current) drug therapy: Secondary | ICD-10-CM

## 2023-04-25 DIAGNOSIS — I110 Hypertensive heart disease with heart failure: Principal | ICD-10-CM | POA: Diagnosis present

## 2023-04-25 DIAGNOSIS — Z8249 Family history of ischemic heart disease and other diseases of the circulatory system: Secondary | ICD-10-CM

## 2023-04-25 DIAGNOSIS — H9190 Unspecified hearing loss, unspecified ear: Secondary | ICD-10-CM | POA: Diagnosis not present

## 2023-04-25 DIAGNOSIS — I509 Heart failure, unspecified: Secondary | ICD-10-CM | POA: Diagnosis not present

## 2023-04-25 DIAGNOSIS — Z1152 Encounter for screening for COVID-19: Secondary | ICD-10-CM | POA: Diagnosis not present

## 2023-04-25 DIAGNOSIS — J9601 Acute respiratory failure with hypoxia: Secondary | ICD-10-CM | POA: Diagnosis not present

## 2023-04-25 DIAGNOSIS — Z7982 Long term (current) use of aspirin: Secondary | ICD-10-CM

## 2023-04-25 DIAGNOSIS — Z833 Family history of diabetes mellitus: Secondary | ICD-10-CM

## 2023-04-25 DIAGNOSIS — I5021 Acute systolic (congestive) heart failure: Secondary | ICD-10-CM | POA: Diagnosis not present

## 2023-04-25 DIAGNOSIS — E785 Hyperlipidemia, unspecified: Secondary | ICD-10-CM | POA: Diagnosis not present

## 2023-04-25 DIAGNOSIS — F0390 Unspecified dementia without behavioral disturbance: Secondary | ICD-10-CM | POA: Diagnosis present

## 2023-04-25 DIAGNOSIS — R0902 Hypoxemia: Secondary | ICD-10-CM | POA: Diagnosis not present

## 2023-04-25 DIAGNOSIS — R600 Localized edema: Secondary | ICD-10-CM | POA: Diagnosis not present

## 2023-04-25 DIAGNOSIS — I10 Essential (primary) hypertension: Secondary | ICD-10-CM | POA: Diagnosis not present

## 2023-04-25 DIAGNOSIS — E669 Obesity, unspecified: Secondary | ICD-10-CM | POA: Diagnosis present

## 2023-04-25 LAB — CBC
HCT: 37.4 % — ABNORMAL LOW (ref 39.0–52.0)
Hemoglobin: 12.1 g/dL — ABNORMAL LOW (ref 13.0–17.0)
MCH: 29.7 pg (ref 26.0–34.0)
MCHC: 32.4 g/dL (ref 30.0–36.0)
MCV: 91.9 fL (ref 80.0–100.0)
Platelets: 351 10*3/uL (ref 150–400)
RBC: 4.07 MIL/uL — ABNORMAL LOW (ref 4.22–5.81)
RDW: 13.2 % (ref 11.5–15.5)
WBC: 7.4 10*3/uL (ref 4.0–10.5)
nRBC: 0 % (ref 0.0–0.2)

## 2023-04-25 LAB — BASIC METABOLIC PANEL
Anion gap: 9 (ref 5–15)
BUN: 18 mg/dL (ref 8–23)
CO2: 22 mmol/L (ref 22–32)
Calcium: 8.9 mg/dL (ref 8.9–10.3)
Chloride: 107 mmol/L (ref 98–111)
Creatinine, Ser: 1.22 mg/dL (ref 0.61–1.24)
GFR, Estimated: 55 mL/min — ABNORMAL LOW (ref >60)
Glucose, Bld: 113 mg/dL — ABNORMAL HIGH (ref 70–99)
Potassium: 4 mmol/L (ref 3.5–5.1)
Sodium: 138 mmol/L (ref 135–145)

## 2023-04-25 LAB — BRAIN NATRIURETIC PEPTIDE: B Natriuretic Peptide: 864.1 pg/mL — ABNORMAL HIGH (ref 0.0–100.0)

## 2023-04-25 LAB — RESP PANEL BY RT-PCR (RSV, FLU A&B, COVID)  RVPGX2
Influenza A by PCR: NEGATIVE
Influenza B by PCR: NEGATIVE
Resp Syncytial Virus by PCR: NEGATIVE
SARS Coronavirus 2 by RT PCR: NEGATIVE

## 2023-04-25 LAB — TROPONIN I (HIGH SENSITIVITY)
Troponin I (High Sensitivity): 20 ng/L — ABNORMAL HIGH (ref <18)
Troponin I (High Sensitivity): 26 ng/L — ABNORMAL HIGH (ref <18)

## 2023-04-25 NOTE — ED Provider Triage Note (Signed)
 Emergency Medicine Provider Triage Evaluation Note  Brandon Dillon. , a 89 y.o. male  was evaluated in triage.  Pt complains of  Shortness of breath, bilateral lower legs swelling Review of Systems  Positive:  Negative:   Physical Exam  There were no vitals taken for this visit. Gen:   Awake, no distress   Resp:  Normal effort  MSK:   Moves extremities without difficulty  Other:    Medical Decision Making  Medically screening exam initiated at 5:22 PM.  Appropriate orders placed.  Brandon Dillon. was informed that the remainder of the evaluation will be completed by another provider, this initial triage assessment does not replace that evaluation, and the importance of remaining in the ED until their evaluation is complete.  Patient's wife is here with same chief complaint of shortness of breath.  Respiratory panel, EKG and labs are ordered    Janit Kast, PA-C 04/25/23 1726

## 2023-04-25 NOTE — ED Triage Notes (Signed)
Pt arrived via POV with wife reports shortness of breath pt states since last October, pt's wife also being seen for shortness of breath, pt is hard of hearing.  No respiratory distress noted. Pt reports cough also.

## 2023-04-26 ENCOUNTER — Inpatient Hospital Stay
Admit: 2023-04-26 | Discharge: 2023-04-26 | Disposition: A | Discharge status: Home / Self Care | Payer: 59 | Attending: Family Medicine | Admitting: Family Medicine

## 2023-04-26 ENCOUNTER — Inpatient Hospital Stay
Admission: EM | Admit: 2023-04-26 | Discharge: 2023-04-28 | DRG: 291 | Disposition: A | Discharge status: Home Health | Payer: 59 | Attending: Internal Medicine | Admitting: Internal Medicine

## 2023-04-26 DIAGNOSIS — I509 Heart failure, unspecified: Secondary | ICD-10-CM

## 2023-04-26 DIAGNOSIS — Z823 Family history of stroke: Secondary | ICD-10-CM | POA: Diagnosis not present

## 2023-04-26 DIAGNOSIS — J9601 Acute respiratory failure with hypoxia: Secondary | ICD-10-CM

## 2023-04-26 DIAGNOSIS — R0902 Hypoxemia: Secondary | ICD-10-CM

## 2023-04-26 DIAGNOSIS — F0390 Unspecified dementia without behavioral disturbance: Secondary | ICD-10-CM | POA: Diagnosis present

## 2023-04-26 DIAGNOSIS — Z833 Family history of diabetes mellitus: Secondary | ICD-10-CM | POA: Diagnosis not present

## 2023-04-26 DIAGNOSIS — E785 Hyperlipidemia, unspecified: Secondary | ICD-10-CM | POA: Diagnosis present

## 2023-04-26 DIAGNOSIS — I429 Cardiomyopathy, unspecified: Secondary | ICD-10-CM | POA: Diagnosis present

## 2023-04-26 DIAGNOSIS — I10 Essential (primary) hypertension: Secondary | ICD-10-CM | POA: Diagnosis not present

## 2023-04-26 DIAGNOSIS — I110 Hypertensive heart disease with heart failure: Secondary | ICD-10-CM | POA: Diagnosis present

## 2023-04-26 DIAGNOSIS — I5021 Acute systolic (congestive) heart failure: Secondary | ICD-10-CM | POA: Diagnosis not present

## 2023-04-26 DIAGNOSIS — Z8249 Family history of ischemic heart disease and other diseases of the circulatory system: Secondary | ICD-10-CM | POA: Diagnosis not present

## 2023-04-26 DIAGNOSIS — E1142 Type 2 diabetes mellitus with diabetic polyneuropathy: Secondary | ICD-10-CM | POA: Insufficient documentation

## 2023-04-26 DIAGNOSIS — R0600 Dyspnea, unspecified: Principal | ICD-10-CM

## 2023-04-26 DIAGNOSIS — Z1152 Encounter for screening for COVID-19: Secondary | ICD-10-CM | POA: Diagnosis not present

## 2023-04-26 DIAGNOSIS — Z8546 Personal history of malignant neoplasm of prostate: Secondary | ICD-10-CM | POA: Diagnosis not present

## 2023-04-26 DIAGNOSIS — Z79899 Other long term (current) drug therapy: Secondary | ICD-10-CM | POA: Diagnosis not present

## 2023-04-26 DIAGNOSIS — Z7982 Long term (current) use of aspirin: Secondary | ICD-10-CM | POA: Diagnosis not present

## 2023-04-26 DIAGNOSIS — I493 Ventricular premature depolarization: Secondary | ICD-10-CM | POA: Diagnosis present

## 2023-04-26 DIAGNOSIS — E669 Obesity, unspecified: Secondary | ICD-10-CM | POA: Diagnosis present

## 2023-04-26 DIAGNOSIS — H9190 Unspecified hearing loss, unspecified ear: Secondary | ICD-10-CM | POA: Diagnosis present

## 2023-04-26 DIAGNOSIS — I5023 Acute on chronic systolic (congestive) heart failure: Secondary | ICD-10-CM | POA: Diagnosis present

## 2023-04-26 LAB — CBC
HCT: 37.2 % — ABNORMAL LOW (ref 39.0–52.0)
Hemoglobin: 12 g/dL — ABNORMAL LOW (ref 13.0–17.0)
MCH: 30.2 pg (ref 26.0–34.0)
MCHC: 32.3 g/dL (ref 30.0–36.0)
MCV: 93.7 fL (ref 80.0–100.0)
Platelets: 307 10*3/uL (ref 150–400)
RBC: 3.97 MIL/uL — ABNORMAL LOW (ref 4.22–5.81)
RDW: 13 % (ref 11.5–15.5)
WBC: 7.5 10*3/uL (ref 4.0–10.5)
nRBC: 0.3 % — ABNORMAL HIGH (ref 0.0–0.2)

## 2023-04-26 LAB — ECHOCARDIOGRAM COMPLETE
AR max vel: 2.27 cm2
AV Area VTI: 2.7 cm2
AV Area mean vel: 2.17 cm2
AV Mean grad: 2.7 mm[Hg]
AV Peak grad: 5.1 mm[Hg]
Ao pk vel: 1.12 m/s
Area-P 1/2: 6.4 cm2
MV M vel: 4.97 m/s
MV Peak grad: 98.8 mm[Hg]
P 1/2 time: 404 ms
Radius: 0.7 cm
S' Lateral: 4.53 cm

## 2023-04-26 LAB — BASIC METABOLIC PANEL
Anion gap: 12 (ref 5–15)
BUN: 18 mg/dL (ref 8–23)
CO2: 19 mmol/L — ABNORMAL LOW (ref 22–32)
Calcium: 7.9 mg/dL — ABNORMAL LOW (ref 8.9–10.3)
Chloride: 108 mmol/L (ref 98–111)
Creatinine, Ser: 1.09 mg/dL (ref 0.61–1.24)
GFR, Estimated: >60 mL/min (ref >60)
Glucose, Bld: 123 mg/dL — ABNORMAL HIGH (ref 70–99)
Potassium: 3.6 mmol/L (ref 3.5–5.1)
Sodium: 139 mmol/L (ref 135–145)

## 2023-04-26 LAB — CBG MONITORING, ED
Glucose-Capillary: 121 mg/dL — ABNORMAL HIGH (ref 70–99)
Glucose-Capillary: 85 mg/dL (ref 70–99)
Glucose-Capillary: 152 mg/dL — ABNORMAL HIGH (ref 70–99)

## 2023-04-26 MED ORDER — CARVEDILOL 3.125 MG PO TABS
3.1250 mg | ORAL_TABLET | Freq: Two times a day (BID) | ORAL | Status: DC
  Administered 2023-04-26 – 2023-04-28 (×4): 3.125 mg via ORAL
  Filled 2023-04-26 (×4): qty 1

## 2023-04-26 MED ORDER — INSULIN ASPART 100 UNIT/ML IJ SOLN: 0.0000 [IU] | Freq: Every day | INTRAMUSCULAR | Status: DC

## 2023-04-26 MED ORDER — INSULIN ASPART 100 UNIT/ML IJ SOLN
0.0000 [IU] | Freq: Three times a day (TID) | INTRAMUSCULAR | Status: DC
  Administered 2023-04-26: 3 [IU] via SUBCUTANEOUS
  Administered 2023-04-26: 2 [IU] via SUBCUTANEOUS
  Filled 2023-04-26 (×2): qty 1

## 2023-04-26 MED ORDER — IRBESARTAN 75 MG PO TABS
37.5000 mg | ORAL_TABLET | Freq: Every day | ORAL | Status: DC
  Administered 2023-04-26 – 2023-04-28 (×3): 37.5 mg via ORAL
  Filled 2023-04-26 (×3): qty 0.5

## 2023-04-26 MED ORDER — PERFLUTREN LIPID MICROSPHERE
1.0000 mL | INTRAVENOUS | Status: AC | PRN
  Administered 2023-04-26: 4 mL via INTRAVENOUS

## 2023-04-26 MED ORDER — SPIRONOLACTONE 12.5 MG HALF TABLET
12.5000 mg | ORAL_TABLET | Freq: Every day | ORAL | Status: DC
  Administered 2023-04-26 – 2023-04-28 (×3): 12.5 mg via ORAL
  Filled 2023-04-26 (×3): qty 1

## 2023-04-26 MED ORDER — GABAPENTIN 100 MG PO CAPS
100.0000 mg | ORAL_CAPSULE | Freq: Every day | ORAL | Status: DC
  Administered 2023-04-26 – 2023-04-27 (×3): 100 mg via ORAL
  Filled 2023-04-26 (×3): qty 1

## 2023-04-26 MED ORDER — HYDRALAZINE HCL 20 MG/ML IJ SOLN: 5.0000 mg | INTRAMUSCULAR | Status: DC | PRN

## 2023-04-26 MED ORDER — ONDANSETRON HCL 4 MG/2ML IJ SOLN: 4.0000 mg | Freq: Four times a day (QID) | INTRAMUSCULAR | Status: DC | PRN

## 2023-04-26 MED ORDER — DONEPEZIL HCL 5 MG PO TABS
5.0000 mg | ORAL_TABLET | Freq: Every day | ORAL | Status: DC
  Administered 2023-04-26 – 2023-04-27 (×3): 5 mg via ORAL
  Filled 2023-04-26 (×3): qty 1

## 2023-04-26 MED ORDER — FUROSEMIDE 10 MG/ML IJ SOLN
80.0000 mg | Freq: Once | INTRAMUSCULAR | Status: AC
  Administered 2023-04-26: 80 mg via INTRAVENOUS
  Filled 2023-04-26: qty 8

## 2023-04-26 MED ORDER — FUROSEMIDE 10 MG/ML IJ SOLN
40.0000 mg | Freq: Two times a day (BID) | INTRAMUSCULAR | Status: DC
Start: 2023-04-26 — End: 2023-04-28
  Administered 2023-04-26 – 2023-04-28 (×5): 40 mg via INTRAVENOUS
  Filled 2023-04-26 (×5): qty 4

## 2023-04-26 MED ORDER — ACETAMINOPHEN 650 MG RE SUPP: 650.0000 mg | Freq: Four times a day (QID) | RECTAL | Status: DC | PRN

## 2023-04-26 MED ORDER — SIMVASTATIN 20 MG PO TABS
20.0000 mg | ORAL_TABLET | Freq: Every morning | ORAL | Status: DC
Start: 2023-04-26 — End: 2023-04-28
  Administered 2023-04-26 – 2023-04-28 (×3): 20 mg via ORAL
  Filled 2023-04-26: qty 1
  Filled 2023-04-26 (×2): qty 2

## 2023-04-26 MED ORDER — ENOXAPARIN SODIUM 40 MG/0.4ML IJ SOSY
40.0000 mg | PREFILLED_SYRINGE | INTRAMUSCULAR | Status: DC
  Administered 2023-04-26 – 2023-04-28 (×3): 40 mg via SUBCUTANEOUS
  Filled 2023-04-26 (×3): qty 0.4

## 2023-04-26 MED ORDER — TRAZODONE HCL 50 MG PO TABS: 25.0000 mg | ORAL_TABLET | Freq: Every evening | ORAL | Status: DC | PRN

## 2023-04-26 MED ORDER — ONDANSETRON HCL 4 MG PO TABS: 4.0000 mg | ORAL_TABLET | Freq: Four times a day (QID) | ORAL | Status: DC | PRN

## 2023-04-26 MED ORDER — ASPIRIN 81 MG PO TBEC
81.0000 mg | DELAYED_RELEASE_TABLET | Freq: Every day | ORAL | Status: DC
  Administered 2023-04-26 – 2023-04-28 (×3): 81 mg via ORAL
  Filled 2023-04-26 (×3): qty 1

## 2023-04-26 MED ORDER — MAGNESIUM HYDROXIDE 400 MG/5ML PO SUSP: 30.0000 mL | Freq: Every day | ORAL | Status: DC | PRN

## 2023-04-26 MED ORDER — ACETAMINOPHEN 325 MG PO TABS
650.0000 mg | ORAL_TABLET | Freq: Four times a day (QID) | ORAL | Status: DC | PRN
Start: 2023-04-26 — End: 2023-04-28

## 2023-04-26 NOTE — ED Notes (Signed)
 Patient soiled with urine and stool. Patient cleaned, brief placed, chucks and linens on bed changed. Monitor reapplied. CB in reach. Urinal at bedside.

## 2023-04-26 NOTE — Assessment & Plan Note (Addendum)
-   I suspect diastolic etiology.  The patient has subsequent acute respiratory failure with hypoxia.  -The patient will be admitted to a cardiac telemetry bed. - We will continue diuresis with IV Lasix . - We Will follow serial troponins. - We will follow I's and O's and daily weights. - Cardiology consult will be obtained. - I notified Dr. Florencio about the patient.

## 2023-04-26 NOTE — ED Notes (Signed)
 Pt cleaned up and clean gown placed. Pt given warm blankets and head of bed lower. Pt resting comfortably and denies any needs.

## 2023-04-26 NOTE — Progress Notes (Signed)
 Progress Note   Patient: Brandon Dillon. FMW:[id number] DOB: 1927-05-20 DOA: 04/26/2023     0 DOS: the patient was seen and examined on 04/26/2023   Brief hospital course: 89yo with h/o DM, HTN, HLD, and prostate CA who presented on 12/31 with SOB and LE edema.  Likely new onset CHF.  O2 sats into the 80s, started on 2L [place] O2.  He was started on Lasix  with plan for cardiology consult and echocardiogram.  Assessment and Plan:  New onset CHF Patient without known h/o CHF presenting with worsening SOB and hypoxia CXR consistent with moderate to severe pulmonary edema Mildly elevated BNP without known baseline With elevated BNP and abnl CXR, acute decompensated CHF seems probable as diagnosis Will admit, as per the Emergency HF Mortality Risk Grade.  The patient has:  severe pulmonary edema requiring new O2 therapy with reported O2 saturations in the 80s Echocardiogram ordered and shows severely decreased LV function with EF 25-30% Was given Lasix  80 mg x 1 in ER and will repeat with 40 mg IV BID Continue [place] O2 for now Cardiology consulted for new-onset systolic CHF Continue ASA  HTN Continue valsartan  (irbesartan  per formulary) Will also add prn hydralazine   HLD Continue simvastatin   DM Last A1c was 5.6 He is diet controlled He does not need SSI at this time  Dementia Delirium precautions Continue Aricept      Consultants: Cardiology  Procedures: Echocardiogram 1/1  Antibiotics: None   30 Day Unplanned Readmission Risk Score    Flowsheet Row ED to Hosp-Admission (Current) from 04/26/2023 in [facility] Emergency Department at [facility]  30 Day Unplanned Readmission Risk Score (%) 13.83 Filed at 04/26/2023 0801       This score is the patient's risk of an unplanned readmission within 30 days of being discharged (0 -100%). The score is based on dignosis, age, lab data, medications, orders, and past utilization.   Low:  0-14.9   Medium: 15-21.9   High:  22-29.9   Extreme: 30 and above            Subjective: Pleasant, no complaints.  On O2, which he does not wear at home.  Physical Exam: Vitals:   04/26/23 0549 04/26/23 0935 04/26/23 1342 04/26/23 1342  BP: (!) 170/77 136/81    Pulse: 99 92    Resp: (!) 23 19  16   Temp: 97.9 F (36.6 C) 98 F (36.7 C) 98.4 F (36.9 C)   TempSrc: Oral  Oral   SpO2: 98% 98%    Weight:      Height:         Intake/Output Summary (Last 24 hours) at 04/26/2023 1655 Last data filed at 04/26/2023 1008 Gross per 24 hour  Intake --  Output 2250 ml  Net -2250 ml   Filed Weights   04/25/23 1722 04/25/23 2316  Weight: 111 kg 98 kg    Exam:  General:  Appears calm and comfortable and is in NAD, on [place] O2 Eyes:  EOMI, normal lids, iris ENT:  grossly normal hearing, lips & tongue, mmm Neck:  no LAD, masses or thyromegaly Cardiovascular:  RRR. 2+ LE edema.  Respiratory:   CTA bilaterally with no wheezes/rales/rhonchi.  Normal to mildly increased respiratory effort. Abdomen:  soft, NT, ND Skin:  no rash or induration seen on limited exam Musculoskeletal:  grossly normal tone BUE/BLE, good ROM, no bony abnormality Psychiatric:  grossly normal mood and affect, speech fluent and appropriate Neurologic:  CN 2-12 grossly intact, moves all  extremities in coordinated fashion  Data Reviewed: I have reviewed the patient's lab results since admission.  Pertinent labs for today include:   Glucose 123 WBC 7.5 Hgb 12 BNP 864.1 HS troponin 20, 26 COVID/flu/RSV negative    Family Communication: Son was present throughout evaluation  Disposition: Status is: Inpatient Remains inpatient appropriate because: ongoing evaluation and management  Planned Discharge Destination: Home    Time spent: 50 minutes  Author: Delon Herald, MD 04/26/2023 4:55 PM  For on call review [email or web address].

## 2023-04-26 NOTE — ED Provider Notes (Signed)
 [facility] Provider Note    Event Date/Time   First MD Initiated Contact with Patient 04/26/23 0040     (approximate)  History   Chief Complaint: Shortness of Breath  HPI  Brandon Skora. is a 89 y.o. male with a past medical history of diabetes, hypertension, hyperlipidemia, presents to the emergency department for worsening shortness of breath.  According to the son for the past few weeks he has noticed that the patient has been experiencing worsening shortness of breath.  He has also been experiencing significant lower extremity edema which is new for the patient.  No history of CHF or diuretics previously.  No fever.  Occasional cough but no sputum production.  No chest pain.  Physical Exam   Triage Vital Signs: ED Triage Vitals  Encounter Vitals Group     BP 04/25/23 1724 137/78     Systolic BP Percentile --      Diastolic BP Percentile --      Pulse Rate 04/25/23 1724 92     Resp 04/25/23 1724 20     Temp 04/25/23 1724 98.4 F (36.9 C)     Temp Source 04/25/23 1724 Oral     SpO2 04/25/23 1724 97 %     Weight 04/25/23 1722 244 lb 11.4 oz (111 kg)     Height 04/25/23 1722 6' 2 (1.88 m)     Head Circumference --      Peak Flow --      Pain Score 04/25/23 1725 0     Pain Loc --      Pain Education --      Exclude from Growth Chart --     Most recent vital signs: Vitals:   04/25/23 1724 04/25/23 2316  BP: 137/78 136/67  Pulse: 92 (!) 50  Resp: 20 16  Temp: 98.4 F (36.9 C) 98.6 F (37 C)  SpO2: 97% 95%    General: Awake, no distress.  CV:  Good peripheral perfusion.  Regular rate and rhythm  Resp:  Normal effort.  Equal breath sounds bilaterally.  Abd:  No distention.  Soft, nontender.  No rebound or guarding. Other:  3+ lower extremity edema bilaterally.   ED Results / Procedures / Treatments   EKG  EKG viewed and interpreted by myself shows a sinus rhythm at 98 bpm with a narrow QRS, normal axis, normal intervals, no  concerning ST changes occasional PVC.  RADIOLOGY  I have reviewed and interpreted the chest x-ray images.  Patient has what appears to have bilateral pleural effusions as well as pulmonary edema. Radiology has read the x-ray is consistent with moderate to severe CHF.   MEDICATIONS ORDERED IN ED: Medications  furosemide  (LASIX ) injection 80 mg (has no administration in time range)     IMPRESSION / MDM / ASSESSMENT AND PLAN / ED COURSE  I reviewed the triage vital signs and the nursing notes.  Patient's presentation is most consistent with acute presentation with potential threat to life or bodily function.  Patient presents to the emergency department for worsening shortness of breath over the last few weeks.  Patient has been noticing increased lower extremity edema.  No history of CHF previously.  No chest pain.  Troponin minimally elevated at 20.  CBC is reassuring, chemistry is reassuring including normal renal function.  BNP is elevated to 864.  COVID/flu/RSV is negative.  Given the patient's elevated BNP new onset peripheral edema shortness of breath and chest x-ray findings consistent with  CHF will admit to the hospital service for IV diuresis.  Patient is agreeable to plan of care.  Currently satting in the 80s on room air placed on 2 L nasal cannula.  FINAL CLINICAL IMPRESSION(S) / ED DIAGNOSES   Hypoxia New onset congestive heart failure Peripheral edema  Note:  This document was prepared using Dragon voice recognition software and may include unintentional dictation errors.   Dorothyann Drivers, MD 04/26/23 [phone]

## 2023-04-26 NOTE — ED Notes (Signed)
 Report given to [facility]

## 2023-04-26 NOTE — Consult Note (Signed)
 CARDIOLOGY CONSULT NOTE               Patient ID: Brandon Dillon. MRN: [id number] DOB/AGE: 1927/08/16 89 y.o.  Admit date: 04/26/2023 Referring Physician Dr. Delon Herald hospitalist Primary Physician Dr. Cathryne Molt primary Primary Cardiologist  Reason for Consultation shortness of breath  HPI: 89 year old male presents with diabetes hypertension hyperlipidemia obesity with acute shortness of breath over the last few days he has had difficulty lying flat with PND and orthopnea he is also had some lower extremity edema he has had a dry cough and wheezing with his persistent dyspneic symptoms he came to the emergency room for evaluation.  He states that he is fairly active and takes care of his own yard.  In the emergency room troponins were relatively low but rose slightly BNP was over 800 EKG showed nonspecific findings chest x-ray suggested heart failure he has never seen a cardiologist.  Patient denied any chest pain  Review of systems complete and found to be negative unless listed above     Past Medical History:  Diagnosis Date   Diabetes mellitus without complication (HCC)    Hearing loss    Hx of fracture of wrist 07/20/2017   >30 years ago. <1989   Hyperlipidemia    Hypertension    Prostate cancer [facility])     Past Surgical History:  Procedure Laterality Date   CATARACT EXTRACTION W/PHACO Right 09/27/2017   Procedure: CATARACT EXTRACTION PHACO AND INTRAOCULAR LENS PLACEMENT (IOC) RIGHT;  Surgeon: Mittie Gaskin, MD;  Location: [facility];  Service: Ophthalmology;  Laterality: Right;  IVA TOPICAL DIABETES   CATARACT EXTRACTION W/PHACO Left 10/18/2017   Procedure: CATARACT EXTRACTION PHACO AND INTRAOCULAR LENS PLACEMENT (IOC)  LEFT DIABETIC;  Surgeon: Mittie Gaskin, MD;  Location: [facility];  Service: Ophthalmology;  Laterality: Left;  DIABETIC-ORAL MED   COLONOSCOPY  2011   normal- Dr?    (Not in a hospital admission)  Social  History   Socioeconomic History   Marital status: Married    Spouse name: Not on file   Number of children: Not on file   Years of education: Not on file   Highest education level: Not on file  Occupational History   Not on file  Tobacco Use   Smoking status: Never   Smokeless tobacco: Never  Vaping Use   Vaping status: Never Used  Substance and Sexual Activity   Alcohol use: No    Alcohol/week: 0.0 standard drinks of alcohol   Drug use: No   Sexual activity: Yes  Other Topics Concern   Not on file  Social History Narrative   Pt lives with wife.    Social Drivers of Corporate Investment Banker Strain: Low Risk  (04/25/2023)   Received from [facility]   Overall Financial Resource Strain (CARDIA)    Difficulty of Paying Living Expenses: Not hard at all  Food Insecurity: No Food Insecurity (04/25/2023)   Received from [facility]   Hunger Vital Sign    Worried About Running Out of Food in the Last Year: Never true    Ran Out of Food in the Last Year: Never true  Transportation Needs: No Transportation Needs (04/25/2023)   Received from [facility] - Transportation    In the past 12 months, has lack of transportation kept you from medical appointments or from getting medications?: No    Lack of Transportation (Non-Medical): No  Physical Activity: Insufficiently Active (12/07/2022)   Exercise Vital Sign    Days of Exercise per Week: 3 days    Minutes of Exercise per Session: 30 min  Stress: No Stress Concern Present (12/07/2022)   [employer] of Occupational Health - Occupational Stress Questionnaire    Feeling of Stress : Not at all  Social Connections: Moderately Integrated (12/07/2022)   Social Connection and Isolation Panel [NHANES]    Frequency of Communication with Friends and Family: More than three times a week    Frequency of Social Gatherings with Friends and Family: Twice a week    Attends  Religious Services: More than 4 times per year    Active Member of [employer] or Organizations: No    Attends Banker Meetings: Never    Marital Status: Married  Catering Manager Violence: Not At Risk (12/07/2022)   Humiliation, Afraid, Rape, and Kick questionnaire    Fear of Current or Ex-Partner: No    Emotionally Abused: No    Physically Abused: No    Sexually Abused: No    Family History  Family history unknown: Yes      Review of systems complete and found to be negative unless listed above      PHYSICAL EXAM  General: Well developed, well nourished, in no acute distress HEENT:  Normocephalic and atramatic Neck:  No JVD.  Lungs: Clear bilaterally to auscultation and percussion. Heart: HRRR . Normal S1 and S2 without gallops or murmurs.  Abdomen: Bowel sounds are positive, abdomen soft and non-tender  Msk:  Back normal, normal gait. Normal strength and tone for age. Extremities: No clubbing, cyanosis or edema.   Neuro: Alert and oriented X 3. Psych:  Good affect, responds appropriately  Labs:   Lab Results  Component Value Date   WBC 7.5 04/26/2023   HGB 12.0 (L) 04/26/2023   HCT 37.2 (L) 04/26/2023   MCV 93.7 04/26/2023   PLT 307 04/26/2023    Recent Labs  Lab 04/26/23 0547  NA 139  K 3.6  CL 108  CO2 19*  BUN 18  CREATININE 1.09  CALCIUM 7.9*  GLUCOSE 123*   No results found for: CKTOTAL, CKMB, CKMBINDEX, TROPONINI  Lab Results  Component Value Date   CHOL 188 02/22/2022   CHOL 196 04/12/2021   CHOL 159 11/04/2019   Lab Results  Component Value Date   HDL 58 02/22/2022   HDL 56 04/12/2021   HDL 47 11/04/2019   Lab Results  Component Value Date   LDLCALC 115 (H) 02/22/2022   LDLCALC 122 (H) 04/12/2021   LDLCALC 99 11/04/2019   Lab Results  Component Value Date   TRIG 79 02/22/2022   TRIG 101 04/12/2021   TRIG 68 11/04/2019   Lab Results  Component Value Date   CHOLHDL 3.2 04/04/2018   CHOLHDL 3.7 11/02/2017    CHOLHDL 3.0 02/15/2017   No results found for: LDLDIRECT    Radiology: ECHOCARDIOGRAM COMPLETE Result Date: 04/26/2023    ECHOCARDIOGRAM REPORT   Patient Name:   Brandon Dillon. Date of Exam: 04/26/2023 Medical Rec #:  [id number]         Height:       74.0 in Accession #:    [id number]        Weight:       216.0 lb Date of Birth:  04/23/1928        BSA:          2.246 m Patient Age:  89 years          BP:           136/67 mmHg Patient Gender: M                 HR:           96 bpm. Exam Location:  [facility] Procedure: 2D Echo, 3D Echo, Cardiac Doppler, Color Doppler and Intracardiac            Opacification Agent Indications:     CHF  History:         Patient has no prior history of Echocardiogram examinations.                  CHF, PAD and Stroke; Risk Factors:Diabetes, Dyslipidemia and                  Hypertension.  Sonographer:     Tillman Nora RVT RCS Referring Phys:  [id number] [facility] [employer] Diagnosing Phys: Krysta Bloomfield D Hailynn Slovacek MD IMPRESSIONS  1. Left ventricular ejection fraction, by estimation, is 25 to 30%. The left ventricle has severely decreased function. The left ventricle demonstrates global hypokinesis. The left ventricular internal cavity size was mildly dilated. Left ventricular diastolic parameters were normal.  2. Right ventricular systolic function is low normal. The right ventricular size is normal.  3. The mitral valve is normal in structure. Mild to moderate mitral valve regurgitation.  4. The aortic valve is normal in structure. Aortic valve regurgitation is not visualized. Aortic valve sclerosis/calcification is present, without any evidence of aortic stenosis. FINDINGS  Left Ventricle: Left ventricular ejection fraction, by estimation, is 25 to 30%. The left ventricle has severely decreased function. The left ventricle demonstrates global hypokinesis. Definity  contrast agent was given IV to delineate the left ventricular endocardial borders. The left ventricular internal cavity size was mildly  dilated. There is borderline left ventricular hypertrophy. Left ventricular diastolic parameters were normal. Right Ventricle: The right ventricular size is normal. No increase in right ventricular wall thickness. Right ventricular systolic function is low normal. Left Atrium: Left atrial size was normal in size. Right Atrium: Right atrial size was normal in size. Pericardium: There is no evidence of pericardial effusion. Mitral Valve: The mitral valve is normal in structure. Mild to moderate mitral valve regurgitation. Tricuspid Valve: The tricuspid valve is normal in structure. Tricuspid valve regurgitation is trivial. Aortic Valve: The aortic valve is normal in structure. Aortic valve regurgitation is not visualized. Aortic regurgitation PHT measures 404 msec. Aortic valve sclerosis/calcification is present, without any evidence of aortic stenosis. Aortic valve mean gradient measures 2.7 mmHg. Aortic valve peak gradient measures 5.1 mmHg. Aortic valve area, by VTI measures 2.70 cm. Pulmonic Valve: The pulmonic valve was normal in structure. Pulmonic valve regurgitation is not visualized. Aorta: The ascending aorta was not well visualized. IAS/Shunts: No atrial level shunt detected by color flow Doppler.  LEFT VENTRICLE PLAX 2D LVIDd:         5.10 cm   Diastology LVIDs:         4.53 cm   LV e' medial:    5.03 cm/s LV PW:         1.20 cm   LV E/e' medial:  18.8 LV IVS:        1.07 cm   LV e' lateral:   8.93 cm/s LVOT diam:     1.90 cm   LV E/e' lateral: 10.6 LV SV:  46 LV SV Index:   21 LVOT Area:     2.84 cm  RIGHT VENTRICLE RV Basal diam:  4.20 cm RV S prime:     10.20 cm/s TAPSE (M-mode): 1.7 cm LEFT ATRIUM             Index        RIGHT ATRIUM           Index LA diam:        4.10 cm 1.83 cm/m   RA Area:     17.10 cm LA Vol (A2C):   73.4 ml 32.68 ml/m  RA Volume:   50.00 ml  22.26 ml/m LA Vol (A4C):   59.3 ml 26.41 ml/m LA Biplane Vol: 66.8 ml 29.75 ml/m  AORTIC VALVE                    PULMONIC  VALVE AV Area (Vmax):    2.27 cm     PV Vmax:          0.77 m/s AV Area (Vmean):   2.17 cm     PV Peak grad:     2.4 mmHg AV Area (VTI):     2.70 cm     PR End Diast Vel: 4.24 msec AV Vmax:           112.40 cm/s AV Vmean:          76.367 cm/s AV VTI:            0.171 m AV Peak Grad:      5.1 mmHg AV Mean Grad:      2.7 mmHg LVOT Vmax:         89.80 cm/s LVOT Vmean:        58.450 cm/s LVOT VTI:          0.163 m LVOT/AV VTI ratio: 0.95 AI PHT:            404 msec  AORTA Ao Root diam: 3.20 cm MITRAL VALVE                  TRICUSPID VALVE MV Area (PHT): 6.40 cm       TR Peak grad:   43.3 mmHg MV Decel Time: 119 msec       TR Vmax:        329.00 cm/s MR Peak grad:    98.8 mmHg MR Mean grad:    70.0 mmHg    SHUNTS MR Vmax:         497.00 cm/s  Systemic VTI:  0.16 m MR Vmean:        405.0 cm/s   Systemic Diam: 1.90 cm MR PISA:         3.08 cm MR PISA Eff ROA: 18 mm MR PISA Radius:  0.70 cm MV E velocity: 94.40 cm/s MV A velocity: 82.85 cm/s MV E/A ratio:  1.14 Kiaya Haliburton D Traylen Eckels MD Electronically signed by Cara JONETTA Lovelace MD Signature Date/Time: 04/26/2023/4:08:58 PM    Final    DG Chest 2 View Result Date: 04/25/2023 CLINICAL DATA:  Short of breath EXAM: CHEST - 2 VIEW COMPARISON:  11/02/2010 FINDINGS: Frontal and lateral views of the chest demonstrate an enlarged cardiac silhouette. There are diffuse interstitial and ground-glass opacities throughout the lungs, with denser areas of consolidation at the lung bases. There are small bilateral pleural effusions. No pneumothorax. IMPRESSION: 1. Findings most consistent with moderate to severe congestive heart failure. Electronically Signed   By: Ozell  Brandon M.D.   On: 04/25/2023 20:18    EKG: Normal sinus rhythm rate of 100 nonspecific ST-T wave changes  ASSESSMENT AND PLAN:  New onset congestive heart failure systolic dysfunction Shortness of breath Obesity Lower extremity edema Hypoxemia Hypertension Hyperlipidemia Diabetes diet  controlled Dementia . Plan Agree with admit for heart failure symptoms follow-up EKGs troponins as well as BNP Follow-up EKGs Recommend IV diuretic therapy with Lasix  Continue ARB consider adding a beta-blocker as well as spironolactone  Agree with echocardiogram which suggest ejection fraction between 25 and 30% recommend GDMT for systolic cardiomyopathy Continue supplemental oxygen as necessary for any hypoxemia Recommend conservative therapy but will be aggressive with medications no invasive procedures planned  Signed: Cara JONETTA Lovelace MD 04/26/2023, 4:52 PM

## 2023-04-26 NOTE — ED Notes (Addendum)
 Pt cleaned up and clean brief, chux and linen placed on bed by this RN and EDT Byrd Hesselbach. Pt placed in clean gown. Pt pulled up in bed and breakfast tray set up for pt. Pt given warm blankets. Son at bedside assisting pt with breakfast.

## 2023-04-26 NOTE — Assessment & Plan Note (Signed)
-

## 2023-04-26 NOTE — H&P (Signed)
 [place]   PATIENT NAME: Brandon Dillon    MR#:  [id number]  DATE OF BIRTH:  11-Sep-1927  DATE OF ADMISSION:  04/26/2023  PRIMARY CARE PHYSICIAN: Joshua Cathryne BROCKS, MD   Patient is coming from: Home  REQUESTING/REFERRING PHYSICIAN: Dorothyann Drivers, MD  CHIEF COMPLAINT:   Chief Complaint  Patient presents with   Shortness of Breath    HISTORY OF PRESENT ILLNESS:  Brandon Brahm. is a 89 y.o. African-American male with medical history significant for type 2 diabetes mellitus, hypertension, dyslipidemia and prostate cancer, who presented to the emergency room with acute onset of worsening dyspnea over the last few weeks that got significantly worse lately.  He has been experiencing remarkable lower extremity edema that is new for him.  He also admits to paroxysmal nocturnal dyspnea and orthopnea as well as dyspnea on exertion.  He has been having associated dry cough and wheezing.  He dropped his pulse oximetry to the 80s and heart 2 L of O2 by nasal cannula.   No dysuria, oliguria or hematuria or flank pain.  No nausea or vomiting or abdominal pain.  He denied any chest pain or palpitations.  No bleeding diathesis.  ED Course: Upon presentation to the emergency room, vital signs were within normal.  Labs revealed unremarkable BMP.  BNP was 864.1.  High sensitive troponin I was 20 and later 26 CBC showed anemia close to baseline.  Respiratory panel was negative. EKG as reviewed by me :  showed normal sinus rhythm with rate of 98 with occasional PVCs and PACs with prolonged QT interval with QTc of 487 MS. Imaging: Two-view chest x-ray showed findings most consistent with moderate to severe congestive heart failure.  The patient was given a gram of IV Lasix .  He will be admitted to a cardiac telemetry bed for further evaluation and management. PAST MEDICAL HISTORY:   Past Medical History:  Diagnosis Date   Diabetes mellitus without complication (HCC)    Hearing loss    Hx of  fracture of wrist 07/20/2017   >30 years ago. <1989   Hyperlipidemia    Hypertension    Prostate cancer (HCC)     PAST SURGICAL HISTORY:   Past Surgical History:  Procedure Laterality Date   CATARACT EXTRACTION W/PHACO Right 09/27/2017   Procedure: CATARACT EXTRACTION PHACO AND INTRAOCULAR LENS PLACEMENT (IOC) RIGHT;  Surgeon: Mittie Gaskin, MD;  Location: [facility];  Service: Ophthalmology;  Laterality: Right;  IVA TOPICAL DIABETES   CATARACT EXTRACTION W/PHACO Left 10/18/2017   Procedure: CATARACT EXTRACTION PHACO AND INTRAOCULAR LENS PLACEMENT (IOC)  LEFT DIABETIC;  Surgeon: Mittie Gaskin, MD;  Location: [facility];  Service: Ophthalmology;  Laterality: Left;  DIABETIC-ORAL MED   COLONOSCOPY  2011   normal- Dr?    SOCIAL HISTORY:   Social History   Tobacco Use   Smoking status: Never   Smokeless tobacco: Never  Substance Use Topics   Alcohol use: No    Alcohol/week: 0.0 standard drinks of alcohol    FAMILY HISTORY:   Family History  Family history unknown: Yes  Positive for coronary artery disease, CVA, hypertension and diabetes mellitus.  DRUG ALLERGIES:   Allergies  Allergen Reactions   Lisinopril      coughing  Other Reaction(s): Unknown  coughing coughing    coughing    REVIEW OF SYSTEMS:   ROS As per history of present illness. All pertinent systems were reviewed above. Constitutional, HEENT, cardiovascular, respiratory, GI, GU, musculoskeletal, neuro,  psychiatric, endocrine, integumentary and hematologic systems were reviewed and are otherwise negative/unremarkable except for positive findings mentioned above in the HPI.   MEDICATIONS AT HOME:   Prior to Admission medications   Medication Sig Start Date End Date Taking? Authorizing Provider  aspirin  81 MG tablet Take 81 mg by mouth daily.    [provider]  donepezil  (ARICEPT ) 5 MG tablet Take 1 tablet (5 mg total) by mouth at bedtime. 04/05/23   Joshua Cathryne BROCKS, MD  gabapentin  (NEURONTIN ) 100 MG capsule Take 1 capsule (100 mg total) by mouth at bedtime. 08/23/22   Joshua Cathryne BROCKS, MD  GE100 BLOOD GLUCOSE TEST test strip CHECK BLOOD SUGAR ONCE DAILY OR AS DIRECTED 03/01/22   Joshua Cathryne BROCKS, MD  Lancets (FREESTYLE) lancets AS DIRECTED 07/05/16   Joshua Cathryne BROCKS, MD  simvastatin  (ZOCOR ) 20 MG tablet Take 1 tablet (20 mg total) by mouth every morning. 03/05/23   Joshua Cathryne BROCKS, MD  valsartan  (DIOVAN ) 40 MG tablet Take 1 tablet (40 mg total) by mouth daily. 11/10/22   Joshua Cathryne BROCKS, MD      VITAL SIGNS:  Blood pressure (!) 165/105, pulse 80, temperature 98.7 F (37.1 C), temperature source Oral, resp. rate (!) 22, height 6' 2 (1.88 m), weight 98 kg, SpO2 96%.  PHYSICAL EXAMINATION:  Physical Exam  GENERAL:  89 y.o.-year-old African-American male patient lying in the bed with mild respiratory distress with conversational dyspnea. EYES: Pupils equal, round, reactive to light and accommodation. No scleral icterus. Extraocular muscles intact.  HEENT: Head atraumatic, normocephalic. Oropharynx and nasopharynx clear.  NECK:  Supple, no jugular venous distention. No thyroid enlargement, no tenderness.  LUNGS: Diminished bibasal breath sounds with bibasal rales.. No use of accessory muscles of respiration.  CARDIOVASCULAR: Regular rate and rhythm, S1, S2 normal. No murmurs, rubs, or gallops.  ABDOMEN: Soft, nondistended, nontender. Bowel sounds present. No organomegaly or mass.  EXTREMITIES: 1-2+ bilateral lower extremity pitting edema with no cyanosis, or clubbing.  NEUROLOGIC: Cranial nerves II through XII are intact. Muscle strength 5/5 in all extremities. Sensation intact. Gait not checked.  PSYCHIATRIC: The patient is alert and oriented x 3.  Normal affect and good eye contact. SKIN: No obvious rash, lesion, or ulcer.   LABORATORY PANEL:   CBC Recent Labs  Lab 04/25/23 1820  WBC 7.4  HGB 12.1*  HCT 37.4*  PLT 351    ------------------------------------------------------------------------------------------------------------------  Chemistries  Recent Labs  Lab 04/25/23 1727  NA 138  K 4.0  CL 107  CO2 22  GLUCOSE 113*  BUN 18  CREATININE 1.22  CALCIUM 8.9   ------------------------------------------------------------------------------------------------------------------  Cardiac Enzymes No results for input(s): TROPONINI in the last 168 hours. ------------------------------------------------------------------------------------------------------------------  RADIOLOGY:  DG Chest 2 View Result Date: 04/25/2023 CLINICAL DATA:  Short of breath EXAM: CHEST - 2 VIEW COMPARISON:  11/02/2010 FINDINGS: Frontal and lateral views of the chest demonstrate an enlarged cardiac silhouette. There are diffuse interstitial and ground-glass opacities throughout the lungs, with denser areas of consolidation at the lung bases. There are small bilateral pleural effusions. No pneumothorax. IMPRESSION: 1. Findings most consistent with moderate to severe congestive heart failure. Electronically Signed   By: Brandon Dillon M.D.   On: 04/25/2023 20:18      IMPRESSION AND PLAN:  Assessment and Plan: * Acute CHF (congestive heart failure) (HCC) - I suspect diastolic etiology.  The patient has subsequent acute respiratory failure with hypoxia.  -The patient will be admitted to a cardiac telemetry bed. - We  will continue diuresis with IV Lasix . - We Will follow serial troponins. - We will follow I's and O's and daily weights. - Cardiology consult will be obtained. - I notified Dr. Florencio about the patient.   Dyslipidemia - We will need statin therapy.  Type 2 diabetes mellitus with peripheral neuropathy (HCC) - The patient will be placed on supplemental coverage with NovoLog . - We will continue Neurontin .  Essential hypertension - We will continue antihypertensive therapy.   DVT prophylaxis: Lovenox .   Advanced Care Planning:  Code Status: full code.  Family Communication:  The plan of care was discussed in details with the patient (and family). I answered all questions. The patient agreed to proceed with the above mentioned plan. Further management will depend upon hospital course. Disposition Plan: Back to previous home environment Consults called: Cardiology. All the records are reviewed and case discussed with ED provider.  Status is: Inpatient  At the time of the admission, it appears that the appropriate admission status for this patient is inpatient.  This is judged to be reasonable and necessary in order to provide the required intensity of service to ensure the patient's safety given the presenting symptoms, physical exam findings and initial radiographic and laboratory data in the context of comorbid conditions.  The patient requires inpatient status due to high intensity of service, high risk of further deterioration and high frequency of surveillance required.  I certify that at the time of admission, it is my clinical judgment that the patient will require inpatient hospital care extending more than 2 midnights.                            Dispo: The patient is from: Home              Anticipated d/c is to: Home              Patient currently is not medically stable to d/c.              Difficult to place patient: No  Madison DELENA Peaches M.D on 04/26/2023 at 3:06 AM  [facility]   From 7 PM-7 AM, contact night-coverage [email or web address]  CC: Primary care physician; Joshua Cathryne BROCKS, MD

## 2023-04-26 NOTE — Assessment & Plan Note (Signed)
-   We will need statin therapy.

## 2023-04-26 NOTE — Progress Notes (Signed)
*  PRELIMINARY RESULTS* Echocardiogram 2D Echocardiogram has been performed.  Brandon Dillon 04/26/2023, 12:27 PM

## 2023-04-26 NOTE — Assessment & Plan Note (Signed)
-   We will continue antihypertensive therapy.

## 2023-04-27 DIAGNOSIS — I5021 Acute systolic (congestive) heart failure: Secondary | ICD-10-CM

## 2023-04-27 LAB — HEMOGLOBIN A1C
Hgb A1c MFr Bld: 5.5 % (ref 4.8–5.6)
Mean Plasma Glucose: 111 mg/dL

## 2023-04-27 LAB — CBC WITH DIFFERENTIAL/PLATELET
Abs Immature Granulocytes: 0.05 10*3/uL (ref 0.00–0.07)
Basophils Absolute: 0.1 10*3/uL (ref 0.0–0.1)
Basophils Relative: 1 %
Eosinophils Absolute: 0.2 10*3/uL (ref 0.0–0.5)
Eosinophils Relative: 4 %
HCT: 38.5 % — ABNORMAL LOW (ref 39.0–52.0)
Hemoglobin: 12.5 g/dL — ABNORMAL LOW (ref 13.0–17.0)
Immature Granulocytes: 1 %
Lymphocytes Relative: 19 %
Lymphs Abs: 1.2 10*3/uL (ref 0.7–4.0)
MCH: 29.8 pg (ref 26.0–34.0)
MCHC: 32.5 g/dL (ref 30.0–36.0)
MCV: 91.7 fL (ref 80.0–100.0)
Monocytes Absolute: 0.6 10*3/uL (ref 0.1–1.0)
Monocytes Relative: 11 %
Neutro Abs: 3.9 10*3/uL (ref 1.7–7.7)
Neutrophils Relative %: 64 %
Platelets: 341 10*3/uL (ref 150–400)
RBC: 4.2 MIL/uL — ABNORMAL LOW (ref 4.22–5.81)
RDW: 12.9 % (ref 11.5–15.5)
WBC: 6 10*3/uL (ref 4.0–10.5)
nRBC: 0 % (ref 0.0–0.2)

## 2023-04-27 LAB — BASIC METABOLIC PANEL
Anion gap: 13 (ref 5–15)
BUN: 22 mg/dL (ref 8–23)
CO2: 23 mmol/L (ref 22–32)
Calcium: 8.1 mg/dL — ABNORMAL LOW (ref 8.9–10.3)
Chloride: 100 mmol/L (ref 98–111)
Creatinine, Ser: 1.38 mg/dL — ABNORMAL HIGH (ref 0.61–1.24)
GFR, Estimated: 48 mL/min — ABNORMAL LOW (ref >60)
Glucose, Bld: 193 mg/dL — ABNORMAL HIGH (ref 70–99)
Potassium: 3.2 mmol/L — ABNORMAL LOW (ref 3.5–5.1)
Sodium: 136 mmol/L (ref 135–145)

## 2023-04-27 LAB — CBG MONITORING, ED: Glucose-Capillary: 68 mg/dL — ABNORMAL LOW (ref 70–99)

## 2023-04-27 MED ORDER — POTASSIUM CHLORIDE CRYS ER 20 MEQ PO TBCR
40.0000 meq | EXTENDED_RELEASE_TABLET | ORAL | Status: AC
  Administered 2023-04-27 (×2): 40 meq via ORAL
  Filled 2023-04-27 (×2): qty 2

## 2023-04-27 MED ORDER — POTASSIUM CHLORIDE CRYS ER 20 MEQ PO TBCR: 40.0000 meq | EXTENDED_RELEASE_TABLET | Freq: Once | ORAL | Status: DC

## 2023-04-27 NOTE — Progress Notes (Signed)
 Progress Note   Patient: Brandon Dillon. FMW:[id number] DOB: [birth year] DOA: 04/26/2023     1 DOS: the patient was seen and examined on 04/27/2023   Brief hospital course: 89yo with h/o DM, HTN, HLD, and prostate CA who presented on 12/31 with SOB and LE edema. Likely new onset CHF. O2 sats into the 80s, started on 2L [place] O2. He was started on Lasix  with plan for cardiology consult and echocardiogram.  Echo with EF 25-30%, c/w new onset systolic CHF.  Assessment and Plan:  New onset CHF Patient without known h/o CHF presenting with worsening SOB and hypoxia CXR consistent with moderate to severe pulmonary edema Mildly elevated BNP without known baseline With elevated BNP and abnl CXR, acute decompensated CHF seems probable as diagnosis Will admit, as per the Emergency HF Mortality Risk Grade.  The patient has:  severe pulmonary edema requiring new O2 therapy with reported O2 saturations in the 80s Echocardiogram ordered and shows severely decreased LV function with EF 25-30% Was given Lasix  80 mg x 1 in ER and will repeat with 40 mg IV BID Continue [place] O2 for now, weaning down without difficulty Edema is improving Cardiology consulted for new-onset systolic CHF Continue ASA Possibly dc to home in the next 1-2 days   HTN Continue valsartan  (irbesartan  per formulary) Will also add prn hydralazine    HLD Continue simvastatin    DM Last A1c was 5.6 He is diet controlled He does not need SSI at this time   Dementia Delirium precautions Continue Aricept          Consultants: Cardiology   Procedures: Echocardiogram 1/1   Antibiotics: None    Subjective: Feeling some better, no specific complaints.  Physical Exam: Vitals:   04/27/23 0330 04/27/23 0600 04/27/23 0700 04/27/23 0730  BP: (!) 128/95 134/75 133/79 135/78  Pulse: 71 86 88 64  Resp: 12 10 17 13   Temp: 98 F (36.7 C) 98.2 F (36.8 C)    TempSrc: Oral Oral    SpO2: 100% 100% 100% 100%  Weight:       Height:         Intake/Output Summary (Last 24 hours) at 04/27/2023 0832 Last data filed at 04/26/2023 2018 Gross per 24 hour  Intake --  Output 1600 ml  Net -1600 ml   Filed Weights   04/25/23 1722 04/25/23 2316  Weight: 111 kg 98 kg    Exam:  General:  Appears calm and comfortable and is in NAD, on [place] O2 Eyes:  EOMI, normal lids, iris ENT:  grossly normal hearing, lips & tongue, mmm Neck:  no LAD, masses or thyromegaly Cardiovascular:  RRR. 2+ LE edema.  Respiratory:   CTA bilaterally with no wheezes/rales/rhonchi.  Normal to mildly increased respiratory effort. Abdomen:  soft, NT, ND Skin:  no rash or induration seen on limited exam Musculoskeletal:  grossly normal tone BUE/BLE, good ROM, no bony abnormality Psychiatric:  pleasant mood and affect, speech fluent and appropriate, less oriented when his son was not present Neurologic:  CN 2-12 grossly intact, moves all extremities in coordinated fashion  Data Reviewed: I have reviewed the patient's lab results since admission.  Pertinent labs for today include:  K+ 3.2 BUN 22/Creatinine 1.38/GFR 48 WBC 6 Hgb 12.5     Family Communication: None present; I spoke with his son when I went to see his wife (also admitted)  Disposition: Status is: Inpatient Remains inpatient appropriate because: ongoing evaluation and management  Planned Discharge Destination: Home  Time spent: 35 minutes  Author: Delon Herald, MD 04/27/2023 8:31 AM  For on call review [email or web address].

## 2023-04-27 NOTE — Progress Notes (Signed)
 Patient ID: Brandon Vicci Raddle., male   DOB: 1927-06-21, 89 y.o.   MRN: [id number] [facility] Cardiology    SUBJECTIVE: Patient states shortness of breath leg swelling much improved he is resting comfortably can lay flat in the bed without being dyspneic no chest pain   Vitals:   04/27/23 0100 04/27/23 0230 04/27/23 0330 04/27/23 0600  BP: 131/87  (!) 128/95 134/75  Pulse: 83 88 71 86  Resp: 20 17 12 10   Temp:   98 F (36.7 C) 98.2 F (36.8 C)  TempSrc:   Oral Oral  SpO2: 100% 100% 100% 100%  Weight:      Height:         Intake/Output Summary (Last 24 hours) at 04/27/2023 9275 Last data filed at 04/26/2023 2018 Gross per 24 hour  Intake --  Output 1650 ml  Net -1650 ml      PHYSICAL EXAM  General: Well developed, well nourished, in no acute distress HEENT:  Normocephalic and atramatic Neck:  No JVD.  Lungs: Clear bilaterally to auscultation and percussion. Heart: HRRR . Normal S1 and S2 without gallops or murmurs.  Abdomen: Bowel sounds are positive, abdomen soft and non-tender  Msk:  Back normal, normal gait. Normal strength and tone for age. Extremities: No clubbing, cyanosis or edema.   Neuro: Alert and oriented X 3. Psych:  Good affect, responds appropriately   LABS: Basic Metabolic Panel: Recent Labs    04/25/23 1727 04/26/23 0547  NA 138 139  K 4.0 3.6  CL 107 108  CO2 22 19*  GLUCOSE 113* 123*  BUN 18 18  CREATININE 1.22 1.09  CALCIUM 8.9 7.9*   Liver Function Tests: No results for input(s): AST, ALT, ALKPHOS, BILITOT, PROT, ALBUMIN in the last 72 hours. No results for input(s): LIPASE, AMYLASE in the last 72 hours. CBC: Recent Labs    04/25/23 1820 04/26/23 0547  WBC 7.4 7.5  HGB 12.1* 12.0*  HCT 37.4* 37.2*  MCV 91.9 93.7  PLT 351 307   Cardiac Enzymes: No results for input(s): CKTOTAL, CKMB, CKMBINDEX, TROPONINI in the last 72 hours. BNP: Invalid input(s): POCBNP D-Dimer: No results for input(s): DDIMER in the  last 72 hours. Hemoglobin A1C: Recent Labs    04/26/23 0547  HGBA1C 5.5   Fasting Lipid Panel: No results for input(s): CHOL, HDL, LDLCALC, TRIG, CHOLHDL, LDLDIRECT in the last 72 hours. Thyroid Function Tests: No results for input(s): TSH, T4TOTAL, T3FREE, THYROIDAB in the last 72 hours.  Invalid input(s): FREET3 Anemia Panel: No results for input(s): VITAMINB12, FOLATE, FERRITIN, TIBC, IRON, RETICCTPCT in the last 72 hours.  ECHOCARDIOGRAM COMPLETE Result Date: 04/26/2023    ECHOCARDIOGRAM REPORT   Patient Name:   Brandon Dillon. Date of Exam: 04/26/2023 Medical Rec #:  [id number]         Height:       74.0 in Accession #:    [id number]        Weight:       216.0 lb Date of Birth:  [birth year]        BSA:          2.246 m Patient Age:    89 years          BP:           136/67 mmHg Patient Gender: M                 HR:           96 bpm. Exam  Location:  [facility] Procedure: 2D Echo, 3D Echo, Cardiac Doppler, Color Doppler and Intracardiac            Opacification Agent Indications:     CHF  History:         Patient has no prior history of Echocardiogram examinations.                  CHF, PAD and Stroke; Risk Factors:Diabetes, Dyslipidemia and                  Hypertension.  Sonographer:     Tillman Nora RVT RCS Referring Phys:  [id number] [facility] [employer] [facility] Diagnosing Phys: Vici Novick D Berkeley Vanaken MD IMPRESSIONS  1. Left ventricular ejection fraction, by estimation, is 25 to 30%. The left ventricle has severely decreased function. The left ventricle demonstrates global hypokinesis. The left ventricular internal cavity size was mildly dilated. Left ventricular diastolic parameters were normal.  2. Right ventricular systolic function is low normal. The right ventricular size is normal.  3. The mitral valve is normal in structure. Mild to moderate mitral valve regurgitation.  4. The aortic valve is normal in structure. Aortic valve regurgitation is not visualized. Aortic valve  sclerosis/calcification is present, without any evidence of aortic stenosis. FINDINGS  Left Ventricle: Left ventricular ejection fraction, by estimation, is 25 to 30%. The left ventricle has severely decreased function. The left ventricle demonstrates global hypokinesis. Definity  contrast agent was given IV to delineate the left ventricular endocardial borders. The left ventricular internal cavity size was mildly dilated. There is borderline left ventricular hypertrophy. Left ventricular diastolic parameters were normal. Right Ventricle: The right ventricular size is normal. No increase in right ventricular wall thickness. Right ventricular systolic function is low normal. Left Atrium: Left atrial size was normal in size. Right Atrium: Right atrial size was normal in size. Pericardium: There is no evidence of pericardial effusion. Mitral Valve: The mitral valve is normal in structure. Mild to moderate mitral valve regurgitation. Tricuspid Valve: The tricuspid valve is normal in structure. Tricuspid valve regurgitation is trivial. Aortic Valve: The aortic valve is normal in structure. Aortic valve regurgitation is not visualized. Aortic regurgitation PHT measures 404 msec. Aortic valve sclerosis/calcification is present, without any evidence of aortic stenosis. Aortic valve mean gradient measures 2.7 mmHg. Aortic valve peak gradient measures 5.1 mmHg. Aortic valve area, by VTI measures 2.70 cm. Pulmonic Valve: The pulmonic valve was normal in structure. Pulmonic valve regurgitation is not visualized. Aorta: The ascending aorta was not well visualized. IAS/Shunts: No atrial level shunt detected by color flow Doppler.  LEFT VENTRICLE PLAX 2D LVIDd:         5.10 cm   Diastology LVIDs:         4.53 cm   LV e' medial:    5.03 cm/s LV PW:         1.20 cm   LV E/e' medial:  18.8 LV IVS:        1.07 cm   LV e' lateral:   8.93 cm/s LVOT diam:     1.90 cm   LV E/e' lateral: 10.6 LV SV:         46 LV SV Index:   21 LVOT Area:      2.84 cm  RIGHT VENTRICLE RV Basal diam:  4.20 cm RV S prime:     10.20 cm/s TAPSE (M-mode): 1.7 cm LEFT ATRIUM             Index  RIGHT ATRIUM           Index LA diam:        4.10 cm 1.83 cm/m   RA Area:     17.10 cm LA Vol (A2C):   73.4 ml 32.68 ml/m  RA Volume:   50.00 ml  22.26 ml/m LA Vol (A4C):   59.3 ml 26.41 ml/m LA Biplane Vol: 66.8 ml 29.75 ml/m  AORTIC VALVE                    PULMONIC VALVE AV Area (Vmax):    2.27 cm     PV Vmax:          0.77 m/s AV Area (Vmean):   2.17 cm     PV Peak grad:     2.4 mmHg AV Area (VTI):     2.70 cm     PR End Diast Vel: 4.24 msec AV Vmax:           112.40 cm/s AV Vmean:          76.367 cm/s AV VTI:            0.171 m AV Peak Grad:      5.1 mmHg AV Mean Grad:      2.7 mmHg LVOT Vmax:         89.80 cm/s LVOT Vmean:        58.450 cm/s LVOT VTI:          0.163 m LVOT/AV VTI ratio: 0.95 AI PHT:            404 msec  AORTA Ao Root diam: 3.20 cm MITRAL VALVE                  TRICUSPID VALVE MV Area (PHT): 6.40 cm       TR Peak grad:   43.3 mmHg MV Decel Time: 119 msec       TR Vmax:        329.00 cm/s MR Peak grad:    98.8 mmHg MR Mean grad:    70.0 mmHg    SHUNTS MR Vmax:         497.00 cm/s  Systemic VTI:  0.16 m MR Vmean:        405.0 cm/s   Systemic Diam: 1.90 cm MR PISA:         3.08 cm MR PISA Eff ROA: 18 mm MR PISA Radius:  0.70 cm MV E velocity: 94.40 cm/s MV A velocity: 82.85 cm/s MV E/A ratio:  1.14 Lanisa Ishler D Ankita Newcomer MD Electronically signed by Cara JONETTA Lovelace MD Signature Date/Time: 04/26/2023/4:08:58 PM    Final    DG Chest 2 View Result Date: 04/25/2023 CLINICAL DATA:  Short of breath EXAM: CHEST - 2 VIEW COMPARISON:  11/02/2010 FINDINGS: Frontal and lateral views of the chest demonstrate an enlarged cardiac silhouette. There are diffuse interstitial and ground-glass opacities throughout the lungs, with denser areas of consolidation at the lung bases. There are small bilateral pleural effusions. No pneumothorax. IMPRESSION: 1. Findings most  consistent with moderate to severe congestive heart failure. Electronically Signed   By: Ozell Daring M.D.   On: 04/25/2023 20:18     Echo severely depressed left ventricular function EF 25 to 30%   TELEMETRY: Normal sinus rhythm PACs nonspecific ST-T wave changes rate of 90:  ASSESSMENT AND PLAN:  Principal Problem:   Acute CHF (congestive heart failure) (HCC) Active Problems:   Essential hypertension   Type  2 diabetes mellitus with peripheral neuropathy (HCC)   Dyslipidemia   Dementia without behavioral disturbance (HCC) Cardiomyopathy EF 25 to 30% Hypoxemia  Obesity  Plan Agree with admit to telemetry follow-up EKGs troponins and telemetry Continue diuretic therapy for heart failure and fluid overload Advance hypertension management with ARB consider escalating to Entresto Hyperlipidemia continue simvastatin  therapy for lipid management Edema lower extremity markedly improved with diuresis Diabetes diet-controlled continue to follow diabetic diet and increase exercise Mild dementia continue Aricept  therapy Once patient discharged home follow-up with heart failure clinic   Cara JONETTA Lovelace, MD 04/27/2023 7:24 AM

## 2023-04-27 NOTE — ED Notes (Signed)
 Provider at bedside

## 2023-04-27 NOTE — Progress Notes (Signed)
 EKG resulted. MD Callwood made aware. Will continue to monitor.

## 2023-04-27 NOTE — Consult Note (Signed)
 PHARMACY CONSULT NOTE - ELECTROLYTES  Pharmacy Consult for Electrolyte Monitoring and Replacement   Recent Labs: Height: 6' 2 (188 cm) Weight: 98 kg (216 lb) IBW/kg (Calculated) : 82.2 Estimated Creatinine Clearance: 38.9 mL/min (A) (by C-G formula based on SCr of 1.38 mg/dL (H)). Potassium (mmol/L)  Date Value  04/27/2023 3.2 (L)   Calcium (mg/dL)  Date Value  98/97/7974 8.1 (L)   Albumin (g/dL)  Date Value  90/69/7975 4.2  04/12/2021 4.5   Phosphorus (mg/dL)  Date Value  98/77/7978 4.0   Sodium (mmol/L)  Date Value  04/27/2023 136  04/12/2021 140   Assessment  Brandon Dillon. is a 89 y.o. male presenting with worsening SOB and hypoxia. PMH significant for DM, HTN, HLD, and prostate. Likely new onset CHF. ECHO 25-30%. Pharmacy has been consulted to monitor and replace electrolytes.  Diet: Heart MIVF: none Pertinent medications:  Furosemide  40 mg IV BID Spironolactone  12.5 mg daily  Irbesartan  37.5 mg daily   Goal of Therapy: Electrolytes WNL, ideally keep K above 4 and Mg above 2 due to CHF K = 3.2 Mg = NA   Plan:  Give patient Kcl 40 mEq PO x 2 Check Mg tomorrow with AM labs Check BMP, Phos with AM labs  Thank you for allowing pharmacy to be a part of this patient's care.  Alfonso MARLA Buys, PharmD Pharmacy Resident  04/27/2023 3:59 PM

## 2023-04-28 ENCOUNTER — Other Ambulatory Visit: Payer: 59

## 2023-04-28 ENCOUNTER — Telehealth (HOSPITAL_COMMUNITY): Payer: Self-pay | Admitting: Pharmacy Technician

## 2023-04-28 ENCOUNTER — Encounter: Payer: Self-pay | Admitting: Internal Medicine

## 2023-04-28 ENCOUNTER — Other Ambulatory Visit (HOSPITAL_COMMUNITY): Payer: Self-pay

## 2023-04-28 DIAGNOSIS — I5021 Acute systolic (congestive) heart failure: Secondary | ICD-10-CM | POA: Diagnosis not present

## 2023-04-28 LAB — BASIC METABOLIC PANEL
Anion gap: 12 (ref 5–15)
BUN: 21 mg/dL (ref 8–23)
CO2: 24 mmol/L (ref 22–32)
Calcium: 8.5 mg/dL — ABNORMAL LOW (ref 8.9–10.3)
Chloride: 101 mmol/L (ref 98–111)
Creatinine, Ser: 1.23 mg/dL (ref 0.61–1.24)
GFR, Estimated: 55 mL/min — ABNORMAL LOW (ref >60)
Glucose, Bld: 107 mg/dL — ABNORMAL HIGH (ref 70–99)
Potassium: 3.7 mmol/L (ref 3.5–5.1)
Sodium: 137 mmol/L (ref 135–145)

## 2023-04-28 LAB — CBC WITH DIFFERENTIAL/PLATELET
Abs Immature Granulocytes: 0.04 10*3/uL (ref 0.00–0.07)
Basophils Absolute: 0.1 10*3/uL (ref 0.0–0.1)
Basophils Relative: 1 %
Eosinophils Absolute: 0.3 10*3/uL (ref 0.0–0.5)
Eosinophils Relative: 4 %
HCT: 36.8 % — ABNORMAL LOW (ref 39.0–52.0)
Hemoglobin: 12 g/dL — ABNORMAL LOW (ref 13.0–17.0)
Immature Granulocytes: 1 %
Lymphocytes Relative: 32 %
Lymphs Abs: 2.1 10*3/uL (ref 0.7–4.0)
MCH: 29.8 pg (ref 26.0–34.0)
MCHC: 32.6 g/dL (ref 30.0–36.0)
MCV: 91.3 fL (ref 80.0–100.0)
Monocytes Absolute: 0.7 10*3/uL (ref 0.1–1.0)
Monocytes Relative: 11 %
Neutro Abs: 3.3 10*3/uL (ref 1.7–7.7)
Neutrophils Relative %: 51 %
Platelets: 366 10*3/uL (ref 150–400)
RBC: 4.03 MIL/uL — ABNORMAL LOW (ref 4.22–5.81)
RDW: 12.8 % (ref 11.5–15.5)
WBC: 6.5 10*3/uL (ref 4.0–10.5)
nRBC: 0 % (ref 0.0–0.2)

## 2023-04-28 LAB — PHOSPHORUS: Phosphorus: 3.2 mg/dL (ref 2.5–4.6)

## 2023-04-28 LAB — MAGNESIUM: Magnesium: 2.3 mg/dL (ref 1.7–2.4)

## 2023-04-28 MED ORDER — FUROSEMIDE 20 MG PO TABS: 20.0000 mg | ORAL_TABLET | Freq: Every day | ORAL | 1 refills | Status: DC

## 2023-04-28 MED ORDER — SPIRONOLACTONE 25 MG PO TABS: 12.5000 mg | ORAL_TABLET | Freq: Every day | ORAL | 1 refills | Status: DC

## 2023-04-28 MED ORDER — POTASSIUM CHLORIDE CRYS ER 20 MEQ PO TBCR
40.0000 meq | EXTENDED_RELEASE_TABLET | Freq: Once | ORAL | Status: AC
  Administered 2023-04-28: 40 meq via ORAL
  Filled 2023-04-28: qty 2

## 2023-04-28 MED ORDER — DAPAGLIFLOZIN PROPANEDIOL 5 MG PO TABS: 5.0000 mg | ORAL_TABLET | Freq: Every day | ORAL | 1 refills | Status: DC

## 2023-04-28 MED ORDER — CARVEDILOL 3.125 MG PO TABS: 3.1250 mg | ORAL_TABLET | Freq: Two times a day (BID) | ORAL | 1 refills | Status: DC

## 2023-04-28 MED ORDER — DAPAGLIFLOZIN PROPANEDIOL 10 MG PO TABS
10.0000 mg | ORAL_TABLET | Freq: Every day | ORAL | Status: DC
  Filled 2023-04-28: qty 1

## 2023-04-28 NOTE — Consult Note (Signed)
 PHARMACY CONSULT NOTE - ELECTROLYTES  Pharmacy Consult for Electrolyte Monitoring and Replacement   Recent Labs: Height: 6' 2 (188 cm) Weight: 97.1 kg (214 lb 1.1 oz) IBW/kg (Calculated) : 82.2 Estimated Creatinine Clearance: 43.6 mL/min (by C-G formula based on SCr of 1.23 mg/dL).  Potassium (mmol/L)  Date Value  04/28/2023 3.7   Magnesium  (mg/dL)  Date Value  98/96/7974 2.3   Calcium (mg/dL)  Date Value  98/96/7974 8.5 (L)   Albumin (g/dL)  Date Value  90/69/7975 4.2  04/12/2021 4.5   Phosphorus (mg/dL)  Date Value  98/96/7974 3.2   Sodium (mmol/L)  Date Value  04/28/2023 137  04/12/2021 140   Assessment  Brandon Dillon. is a 89 y.o. male presenting with worsening SOB and hypoxia. PMH significant for DM, HTN, HLD, and prostate. Likely new onset CHF. ECHO 25-30%. Pharmacy has been consulted to monitor and replace electrolytes.  Diet: PO MIVF: None Pertinent medications:  Furosemide  40 mg IV BID Spironolactone  12.5 mg PO daily  Irbesartan  37.5 mg PO daily   Goal of Therapy:  Electrolytes WNL Ideally keep K+ above 4 and Mg above 2 due to CHF   Plan:  K+ 3.2 > 3.7 s/p KCl 40 mEq PO x 2 given yesterday  Goal K+ > 4, will order another KCl 40 mEq PO x 1 for today  No other electrolyte replacement indicated at this time  Check BMP, Mg, Phos with AM labs  Thank you for allowing pharmacy to be a part of this patient's care.  Ladamien Rammel, PharmD Pharmacy Resident  04/28/2023 6:59 AM

## 2023-04-28 NOTE — Progress Notes (Signed)
 patient had a 4 beat run of nonsustained v-tach. He states he has no SOB, chest pain, and no other signs and symptoms. His vitals are T-97.9, BP-139/66, MAP-86, RR-20, HR-92, and 98% on RA. Manuela Schwartz NP notified.

## 2023-04-28 NOTE — Progress Notes (Signed)
 Patient ID: Brandon Dillon., male   DOB: 08-16-1927, 89 y.o.   MRN: [id number] [facility] Cardiology    SUBJECTIVE: Patient states to be feeling better less shortness of breath no chest pain improved lower extremity edema.   Vitals:   04/27/23 2334 04/28/23 0101 04/28/23 0444 04/28/23 0627  BP: 123/83 139/66 119/85   Pulse: 81 92 71   Resp: 18 20 17    Temp: 98.3 F (36.8 C) 97.9 F (36.6 C) 97.9 F (36.6 C)   TempSrc:      SpO2: 95% 98% 92%   Weight:    97.1 kg  Height:         Intake/Output Summary (Last 24 hours) at 04/28/2023 9257 Last data filed at 04/28/2023 0300 Gross per 24 hour  Intake 240 ml  Output 725 ml  Net -485 ml      PHYSICAL EXAM  General: Well developed, well nourished, in no acute distress HEENT:  Normocephalic and atramatic Neck:  No JVD.  Lungs: Clear bilaterally to auscultation and percussion. Heart: HRRR . Normal S1 and S2 without gallops or murmurs.  Abdomen: Bowel sounds are positive, abdomen soft and non-tender  Msk:  Back normal, normal gait. Normal strength and tone for age. Extremities: No clubbing, cyanosis or 2+edema.   Neuro: Alert and oriented X 3. Psych:  Good affect, responds appropriately   LABS: Basic Metabolic Panel: Recent Labs    04/27/23 1154 04/28/23 0406  NA 136 137  K 3.2* 3.7  CL 100 101  CO2 23 24  GLUCOSE 193* 107*  BUN 22 21  CREATININE 1.38* 1.23  CALCIUM 8.1* 8.5*  MG  --  2.3  PHOS  --  3.2   Liver Function Tests: No results for input(s): AST, ALT, ALKPHOS, BILITOT, PROT, ALBUMIN in the last 72 hours. No results for input(s): LIPASE, AMYLASE in the last 72 hours. CBC: Recent Labs    04/27/23 1154 04/28/23 0406  WBC 6.0 6.5  NEUTROABS 3.9 3.3  HGB 12.5* 12.0*  HCT 38.5* 36.8*  MCV 91.7 91.3  PLT 341 366   Cardiac Enzymes: No results for input(s): CKTOTAL, CKMB, CKMBINDEX, TROPONINI in the last 72 hours. BNP: Invalid input(s): POCBNP D-Dimer: No results for input(s):  DDIMER in the last 72 hours. Hemoglobin A1C: Recent Labs    04/26/23 0547  HGBA1C 5.5   Fasting Lipid Panel: No results for input(s): CHOL, HDL, LDLCALC, TRIG, CHOLHDL, LDLDIRECT in the last 72 hours. Thyroid Function Tests: No results for input(s): TSH, T4TOTAL, T3FREE, THYROIDAB in the last 72 hours.  Invalid input(s): FREET3 Anemia Panel: No results for input(s): VITAMINB12, FOLATE, FERRITIN, TIBC, IRON, RETICCTPCT in the last 72 hours.  ECHOCARDIOGRAM COMPLETE Result Date: 04/26/2023    ECHOCARDIOGRAM REPORT   Patient Name:   Brandon Dillon. Date of Exam: 04/26/2023 Medical Rec #:  [id number]         Height:       74.0 in Accession #:    [id number]        Weight:       216.0 lb Date of Birth:  Sep 29, 1927        BSA:          2.246 m Patient Age:    89 years          BP:           136/67 mmHg Patient Gender: M                 HR:  96 bpm. Exam Location:  [facility] Procedure: 2D Echo, 3D Echo, Cardiac Doppler, Color Doppler and Intracardiac            Opacification Agent Indications:     CHF  History:         Patient has no prior history of Echocardiogram examinations.                  CHF, PAD and Stroke; Risk Factors:Diabetes, Dyslipidemia and                  Hypertension.  Sonographer:     Tillman Nora RVT RCS Referring Phys:  [id number] [facility] [employer] [facility] Diagnosing Phys: Kenyia Wambolt D Anahy Esh MD IMPRESSIONS  1. Left ventricular ejection fraction, by estimation, is 25 to 30%. The left ventricle has severely decreased function. The left ventricle demonstrates global hypokinesis. The left ventricular internal cavity size was mildly dilated. Left ventricular diastolic parameters were normal.  2. Right ventricular systolic function is low normal. The right ventricular size is normal.  3. The mitral valve is normal in structure. Mild to moderate mitral valve regurgitation.  4. The aortic valve is normal in structure. Aortic valve regurgitation is not visualized. Aortic  valve sclerosis/calcification is present, without any evidence of aortic stenosis. FINDINGS  Left Ventricle: Left ventricular ejection fraction, by estimation, is 25 to 30%. The left ventricle has severely decreased function. The left ventricle demonstrates global hypokinesis. Definity  contrast agent was given IV to delineate the left ventricular endocardial borders. The left ventricular internal cavity size was mildly dilated. There is borderline left ventricular hypertrophy. Left ventricular diastolic parameters were normal. Right Ventricle: The right ventricular size is normal. No increase in right ventricular wall thickness. Right ventricular systolic function is low normal. Left Atrium: Left atrial size was normal in size. Right Atrium: Right atrial size was normal in size. Pericardium: There is no evidence of pericardial effusion. Mitral Valve: The mitral valve is normal in structure. Mild to moderate mitral valve regurgitation. Tricuspid Valve: The tricuspid valve is normal in structure. Tricuspid valve regurgitation is trivial. Aortic Valve: The aortic valve is normal in structure. Aortic valve regurgitation is not visualized. Aortic regurgitation PHT measures 404 msec. Aortic valve sclerosis/calcification is present, without any evidence of aortic stenosis. Aortic valve mean gradient measures 2.7 mmHg. Aortic valve peak gradient measures 5.1 mmHg. Aortic valve area, by VTI measures 2.70 cm. Pulmonic Valve: The pulmonic valve was normal in structure. Pulmonic valve regurgitation is not visualized. Aorta: The ascending aorta was not well visualized. IAS/Shunts: No atrial level shunt detected by color flow Doppler.  LEFT VENTRICLE PLAX 2D LVIDd:         5.10 cm   Diastology LVIDs:         4.53 cm   LV e' medial:    5.03 cm/s LV PW:         1.20 cm   LV E/e' medial:  18.8 LV IVS:        1.07 cm   LV e' lateral:   8.93 cm/s LVOT diam:     1.90 cm   LV E/e' lateral: 10.6 LV SV:         46 LV SV Index:   21 LVOT  Area:     2.84 cm  RIGHT VENTRICLE RV Basal diam:  4.20 cm RV S prime:     10.20 cm/s TAPSE (M-mode): 1.7 cm LEFT ATRIUM             Index  RIGHT ATRIUM           Index LA diam:        4.10 cm 1.83 cm/m   RA Area:     17.10 cm LA Vol (A2C):   73.4 ml 32.68 ml/m  RA Volume:   50.00 ml  22.26 ml/m LA Vol (A4C):   59.3 ml 26.41 ml/m LA Biplane Vol: 66.8 ml 29.75 ml/m  AORTIC VALVE                    PULMONIC VALVE AV Area (Vmax):    2.27 cm     PV Vmax:          0.77 m/s AV Area (Vmean):   2.17 cm     PV Peak grad:     2.4 mmHg AV Area (VTI):     2.70 cm     PR End Diast Vel: 4.24 msec AV Vmax:           112.40 cm/s AV Vmean:          76.367 cm/s AV VTI:            0.171 m AV Peak Grad:      5.1 mmHg AV Mean Grad:      2.7 mmHg LVOT Vmax:         89.80 cm/s LVOT Vmean:        58.450 cm/s LVOT VTI:          0.163 m LVOT/AV VTI ratio: 0.95 AI PHT:            404 msec  AORTA Ao Root diam: 3.20 cm MITRAL VALVE                  TRICUSPID VALVE MV Area (PHT): 6.40 cm       TR Peak grad:   43.3 mmHg MV Decel Time: 119 msec       TR Vmax:        329.00 cm/s MR Peak grad:    98.8 mmHg MR Mean grad:    70.0 mmHg    SHUNTS MR Vmax:         497.00 cm/s  Systemic VTI:  0.16 m MR Vmean:        405.0 cm/s   Systemic Diam: 1.90 cm MR PISA:         3.08 cm MR PISA Eff ROA: 18 mm MR PISA Radius:  0.70 cm MV E velocity: 94.40 cm/s MV A velocity: 82.85 cm/s MV E/A ratio:  1.14 Neeko Pharo D Asaf Elmquist MD Electronically signed by Cara JONETTA Lovelace MD Signature Date/Time: 04/26/2023/4:08:58 PM    Final      Echo failure depressed left ventricular function EF of 25 to 30%  TELEMETRY: Normal sinus rhythm LVH nonspecific ST-T wave changes:  ASSESSMENT AND PLAN:  Principal Problem:   Acute systolic (congestive) heart failure (HCC) Active Problems:   Essential hypertension   Type 2 diabetes mellitus with peripheral neuropathy (HCC)   Dyslipidemia   Dementia without behavioral disturbance (HCC)    Plan Acute on  chronic systolic congestive heart failure continue current therapy supplemental oxygen inhalers diuresis with IV Lasix  Extremity edema improving recommend support stockings elevation diuretics Severely depressed left ventricular function EF of 25 to 30% continue IV Lasix  Hypertension reasonably controlled currently on valsartan  hydralazine  beta-blocker Hyperlipidemia continue simvastatin  therapy for lipid management Diabetes type 2 uncomplicated diet control Continue current therapy Follow-up with heart failure clinic   Prestyn Mahn D  Mario Coronado, MD 04/28/2023 7:42 AM

## 2023-04-28 NOTE — Plan of Care (Signed)
  Problem: Coping: Goal: Ability to adjust to condition or change in health will improve Outcome: Progressing   Problem: Fluid Volume: Goal: Ability to maintain a balanced intake and output will improve Outcome: Progressing   Problem: Health Behavior/Discharge Planning: Goal: Ability to manage health-related needs will improve Outcome: Progressing   Problem: Nutritional: Goal: Maintenance of adequate nutrition will improve Outcome: Progressing   Problem: Skin Integrity: Goal: Risk for impaired skin integrity will decrease Outcome: Progressing

## 2023-04-28 NOTE — Evaluation (Signed)
 Physical Therapy Evaluation Patient Details Name: Brandon Dillon. MRN: [id number] DOB: 07/12/1927 Today's Date: 04/28/2023  History of Present Illness  89yo with h/o DM, HTN, HLD, and prostate CA who presented on 12/31 with SOB and LE edema. Likely new onset CHF. O2 sats into the 80s, started on 2L [place] O2. He was started on Lasix  with plan for cardiology consult and echocardiogram.  Echo with EF 25-30%, c/w new onset systolic CHF.  Clinical Impression  Patient received in recliner. He is agreeable to PT assessment. Patient is HOH, pleasant. Patient is able to stand with cga from recliner. He ambulated 150 feet to room 141 where his wife is, visited a while then ambulated back to his room with RW and cga. Patient doing well. No sob. He will continue to benefit from skilled PT to improve strength and endurance.         If plan is discharge home, recommend the following: A little help with walking and/or transfers;A little help with bathing/dressing/bathroom;Assist for transportation;Help with stairs or ramp for entrance   Can travel by private vehicle    yes    Equipment Recommendations Rolling walker (2 wheels)  Recommendations for Other Services       Functional Status Assessment Patient has had a recent decline in their functional status and demonstrates the ability to make significant improvements in function in a reasonable and predictable amount of time.     Precautions / Restrictions Precautions Precautions: Fall Restrictions Weight Bearing Restrictions Per Provider Order: No      Mobility  Bed Mobility               General bed mobility comments: patient received in recliner    Transfers Overall transfer level: Modified independent Equipment used: Rolling walker (2 wheels)                    Ambulation/Gait Ambulation/Gait assistance: Contact guard assist Gait Distance (Feet): 300 Feet Assistive device: Rolling walker (2 wheels) Gait  Pattern/deviations: Step-through pattern, Decreased step length - right, Decreased step length - left, Trunk flexed Gait velocity: decr     General Gait Details: steady with mobility, cues to get closer to AD  Stairs            Wheelchair Mobility     Tilt Bed    Modified Rankin (Stroke Patients Only)       Balance Overall balance assessment: Needs assistance, Modified Independent Sitting-balance support: Feet supported Sitting balance-Leahy Scale: Good     Standing balance support: Bilateral upper extremity supported, During functional activity, Reliant on assistive device for balance Standing balance-Leahy Scale: Good                               Pertinent Vitals/Pain Pain Assessment Pain Assessment: No/denies pain    Home Living Family/patient expects to be discharged to:: Private residence Living Arrangements: Spouse/significant other;Children Available Help at Discharge: Family Type of Home: House Home Access: Stairs to enter Entrance Stairs-Rails: Doctor, General Practice of Steps: 5-6   Home Layout: One level Home Equipment: Cane - single point      Prior Function Prior Level of Function : Independent/Modified Independent             Mobility Comments: patient does not use AD in home, he uses cane when out of home ADLs Comments: independent     Extremity/Trunk Assessment   Upper Extremity Assessment Upper Extremity  Assessment: Overall WFL for tasks assessed    Lower Extremity Assessment Lower Extremity Assessment: Overall WFL for tasks assessed    Cervical / Trunk Assessment Cervical / Trunk Assessment: Normal  Communication   Communication Communication: Hearing impairment Cueing Techniques: Verbal cues  Cognition Arousal: Alert Behavior During Therapy: WFL for tasks assessed/performed Overall Cognitive Status: Within Functional Limits for tasks assessed                                           General Comments      Exercises     Assessment/Plan    PT Assessment Patient needs continued PT services  PT Problem List Decreased strength;Decreased activity tolerance;Decreased balance;Decreased mobility;Decreased knowledge of use of DME       PT Treatment Interventions DME instruction;Gait training;Stair training;Functional mobility training;Therapeutic activities;Therapeutic exercise;Balance training;Patient/family education    PT Goals (Current goals can be found in the Care Plan section)  Acute Rehab PT Goals Patient Stated Goal: to return home PT Goal Formulation: With patient Time For Goal Achievement: 05/12/23 Potential to Achieve Goals: Good    Frequency Min 1X/week     Co-evaluation               AM-PAC PT 6 Clicks Mobility  Outcome Measure Help needed turning from your back to your side while in a flat bed without using bedrails?: A Little Help needed moving from lying on your back to sitting on the side of a flat bed without using bedrails?: A Little Help needed moving to and from a bed to a chair (including a wheelchair)?: A Little Help needed standing up from a chair using your arms (e.g., wheelchair or bedside chair)?: A Little Help needed to walk in hospital room?: A Little Help needed climbing 3-5 steps with a railing? : A Little 6 Click Score: 18    End of Session   Activity Tolerance: Patient tolerated treatment well Patient left: in bed Nurse Communication: Mobility status PT Visit Diagnosis: Muscle weakness (generalized) (M62.81)    Time: 8751-8688 PT Time Calculation (min) (ACUTE ONLY): 23 min   Charges:   PT Evaluation $PT Eval Moderate Complexity: 1 Mod PT Treatments $Gait Training: 8-22 mins PT General Charges $$ ACUTE PT VISIT: 1 Visit         Cleola Perryman, PT, GCS 04/28/23,1:45 PM

## 2023-04-28 NOTE — Hospital Course (Signed)
 89yo with h/o DM, HTN, HLD, and prostate CA who presented on 12/31 with SOB and LE edema. Likely new onset CHF. O2 sats into the 80s, started on 2L [place] O2. He was started on Lasix  with plan for cardiology consult and echocardiogram.  Echo with EF 25-30%, c/w new onset systolic CHF.

## 2023-04-28 NOTE — Discharge Summary (Signed)
 Physician Discharge Summary   Patient: Brandon Dillon. MRN: [id number] DOB: 1927-08-08  Admit date:     04/26/2023  Discharge date: 04/28/23  Discharge Physician: Delon Herald   PCP: Joshua Cathryne BROCKS, MD   Recommendations at discharge:   You are being discharged with home health services and a rolling walker Take Lasix , Farxiga , and spironolactone  daily and carvedilol  twice daily Follow up in the outpatient CHF clinic in 1-2 weeks Follow up with Dr. Joshua in 1-2 weeks  Discharge Diagnoses: Principal Problem:   Acute systolic (congestive) heart failure (HCC) Active Problems:   Essential hypertension   Type 2 diabetes mellitus with peripheral neuropathy (HCC)   Dyslipidemia   Dementia without behavioral disturbance [facility])    Hospital Course: 89yo with h/o DM, HTN, HLD, and prostate CA who presented on 12/31 with SOB and LE edema. Likely new onset CHF. O2 sats into the 80s, started on 2L [place] O2. He was started on Lasix  with plan for cardiology consult and echocardiogram.  Echo with EF 25-30%, c/w new onset systolic CHF.   Assessment and Plan:  New onset CHF Patient without known h/o CHF presenting with worsening SOB and hypoxia CXR consistent with moderate to severe pulmonary edema Mildly elevated BNP without known baseline With elevated BNP and abnl CXR, acute decompensated CHF seems probable as diagnosis Admitted due to severe pulmonary edema requiring new O2 therapy with reported O2 saturations in the 80s Echocardiogram ordered and shows severely decreased LV function with EF 25-30% Was given Lasix  80 mg x 1 in ER and will repeated with 40 mg IV BID Weaned off [place] O2 Edema is improving Cardiology consulted for new-onset systolic CHF Continue ASA DC to home today with outpatient CHF clinic f/u   HTN Continue valsartan  (irbesartan  per formulary) - consider escalation to Entresto as an outpatient Will also add prn hydralazine    HLD Continue simvastatin    DM Last A1c  was 5.6 He is diet controlled He does not need SSI at this time   Dementia Delirium precautions Continue Aricept          Consultants: Cardiology   Procedures: Echocardiogram 1/1   Antibiotics: None      Pain control - [place]  Controlled Substance Reporting System database was reviewed. and patient was instructed, not to drive, operate heavy machinery, perform activities at heights, swimming or participation in water activities or provide baby-sitting services while on Pain, Sleep and Anxiety Medications; until their outpatient Physician has advised to do so again. Also recommended to not to take more than prescribed Pain, Sleep and Anxiety Medications.   Disposition: Home Diet recommendation:  Cardiac diet DISCHARGE MEDICATION: Allergies as of 04/28/2023       Reactions   Lisinopril     coughing Other Reaction(s): Unknown coughing coughing    coughing        Medication List     TAKE these medications    aspirin  81 MG tablet Take 81 mg by mouth daily.   carvedilol  3.125 MG tablet Commonly known as: COREG  Take 1 tablet (3.125 mg total) by mouth 2 (two) times daily with a meal.   dapagliflozin  propanediol 5 MG Tabs tablet Commonly known as: Farxiga  Take 1 tablet (5 mg total) by mouth daily.   donepezil  5 MG tablet Commonly known as: ARICEPT  Take 1 tablet (5 mg total) by mouth at bedtime.   freestyle lancets AS DIRECTED   furosemide  20 MG tablet Commonly known as: Lasix  Take 1 tablet (20 mg total) by mouth daily.  gabapentin  100 MG capsule Commonly known as: NEURONTIN  Take 1 capsule (100 mg total) by mouth at bedtime.   GE100 Blood Glucose Test test strip Generic drug: glucose blood CHECK BLOOD SUGAR ONCE DAILY OR AS DIRECTED   simvastatin  20 MG tablet Commonly known as: ZOCOR  Take 1 tablet (20 mg total) by mouth every morning.   spironolactone  25 MG tablet Commonly known as: ALDACTONE  Take 0.5 tablets (12.5 mg total) by mouth  daily. Start taking on: April 29, 2023   valsartan  40 MG tablet Commonly known as: DIOVAN  Take 1 tablet (40 mg total) by mouth daily.               Durable Medical Equipment  (From admission, onward)           Start     Ordered   04/28/23 1325  For home use only DME 4 wheeled rolling walker with seat  Once       Question:  Patient needs a walker to treat with the following condition  Answer:  Acute systolic (congestive) heart failure (HCC)   04/28/23 1324            Discharge Exam:   Subjective: Feeling better, off O2, feels good about dc to home today.   Objective: Vitals:   04/28/23 0832 04/28/23 1245  BP: 123/83 (!) 114/102  Pulse: 80 83  Resp:    Temp: 97.7 F (36.5 C) (!) 97.5 F (36.4 C)  SpO2: 98% 100%    Intake/Output Summary (Last 24 hours) at 04/28/2023 1352 Last data filed at 04/28/2023 1052 Gross per 24 hour  Intake 240 ml  Output 900 ml  Net -660 ml   Filed Weights   04/25/23 2316 04/28/23 0627 04/28/23 0900  Weight: 98 kg 97.1 kg 89.9 kg    Exam:  General:  Appears calm and comfortable and is in NAD, on RA Eyes:  EOMI, normal lids, iris ENT:  grossly normal hearing, lips & tongue, mmm Neck:  no LAD, masses or thyromegaly Cardiovascular:  RRR. 1+ LE edema.  Respiratory:   CTA bilaterally with no wheezes/rales/rhonchi.  Normal respiratory effort. Abdomen:  soft, NT, ND Skin:  no rash or induration seen on limited exam Musculoskeletal:  grossly normal tone BUE/BLE, good ROM, no bony abnormality Psychiatric:  pleasant mood and affect, speech sparse but appropriate Neurologic:  CN 2-12 grossly intact, moves all extremities in coordinated fashion  Data Reviewed: I have reviewed the patient's lab results since admission.  Pertinent labs for today include:   Unremarkable BMP Stable CBC    Condition at discharge: improving  The results of significant diagnostics from this hospitalization (including imaging, microbiology,  ancillary and laboratory) are listed below for reference.   Imaging Studies: ECHOCARDIOGRAM COMPLETE Result Date: 04/26/2023    ECHOCARDIOGRAM REPORT   Patient Name:   Brandon Dillon. Date of Exam: 04/26/2023 Medical Rec #:  [id number]         Height:       74.0 in Accession #:    [id number]        Weight:       216.0 lb Date of Birth:  27-Sep-1927        BSA:          2.246 m Patient Age:    89 years          BP:           136/67 mmHg Patient Gender: M  HR:           96 bpm. Exam Location:  [facility] Procedure: 2D Echo, 3D Echo, Cardiac Doppler, Color Doppler and Intracardiac            Opacification Agent Indications:     CHF  History:         Patient has no prior history of Echocardiogram examinations.                  CHF, PAD and Stroke; Risk Factors:Diabetes, Dyslipidemia and                  Hypertension.  Sonographer:     Tillman Nora RVT RCS Referring Phys:  [id number] [facility] Diagnosing Phys: Dwayne D Callwood MD IMPRESSIONS  1. Left ventricular ejection fraction, by estimation, is 25 to 30%. The left ventricle has severely decreased function. The left ventricle demonstrates global hypokinesis. The left ventricular internal cavity size was mildly dilated. Left ventricular diastolic parameters were normal.  2. Right ventricular systolic function is low normal. The right ventricular size is normal.  3. The mitral valve is normal in structure. Mild to moderate mitral valve regurgitation.  4. The aortic valve is normal in structure. Aortic valve regurgitation is not visualized. Aortic valve sclerosis/calcification is present, without any evidence of aortic stenosis. FINDINGS  Left Ventricle: Left ventricular ejection fraction, by estimation, is 25 to 30%. The left ventricle has severely decreased function. The left ventricle demonstrates global hypokinesis. Definity  contrast agent was given IV to delineate the left ventricular endocardial borders. The left ventricular internal cavity size was  mildly dilated. There is borderline left ventricular hypertrophy. Left ventricular diastolic parameters were normal. Right Ventricle: The right ventricular size is normal. No increase in right ventricular wall thickness. Right ventricular systolic function is low normal. Left Atrium: Left atrial size was normal in size. Right Atrium: Right atrial size was normal in size. Pericardium: There is no evidence of pericardial effusion. Mitral Valve: The mitral valve is normal in structure. Mild to moderate mitral valve regurgitation. Tricuspid Valve: The tricuspid valve is normal in structure. Tricuspid valve regurgitation is trivial. Aortic Valve: The aortic valve is normal in structure. Aortic valve regurgitation is not visualized. Aortic regurgitation PHT measures 404 msec. Aortic valve sclerosis/calcification is present, without any evidence of aortic stenosis. Aortic valve mean gradient measures 2.7 mmHg. Aortic valve peak gradient measures 5.1 mmHg. Aortic valve area, by VTI measures 2.70 cm. Pulmonic Valve: The pulmonic valve was normal in structure. Pulmonic valve regurgitation is not visualized. Aorta: The ascending aorta was not well visualized. IAS/Shunts: No atrial level shunt detected by color flow Doppler.  LEFT VENTRICLE PLAX 2D LVIDd:         5.10 cm   Diastology LVIDs:         4.53 cm   LV e' medial:    5.03 cm/s LV PW:         1.20 cm   LV E/e' medial:  18.8 LV IVS:        1.07 cm   LV e' lateral:   8.93 cm/s LVOT diam:     1.90 cm   LV E/e' lateral: 10.6 LV SV:         46 LV SV Index:   21 LVOT Area:     2.84 cm  RIGHT VENTRICLE RV Basal diam:  4.20 cm RV S prime:     10.20 cm/s TAPSE (M-mode): 1.7 cm LEFT ATRIUM  Index        RIGHT ATRIUM           Index LA diam:        4.10 cm 1.83 cm/m   RA Area:     17.10 cm LA Vol (A2C):   73.4 ml 32.68 ml/m  RA Volume:   50.00 ml  22.26 ml/m LA Vol (A4C):   59.3 ml 26.41 ml/m LA Biplane Vol: 66.8 ml 29.75 ml/m  AORTIC VALVE                     PULMONIC VALVE AV Area (Vmax):    2.27 cm     PV Vmax:          0.77 m/s AV Area (Vmean):   2.17 cm     PV Peak grad:     2.4 mmHg AV Area (VTI):     2.70 cm     PR End Diast Vel: 4.24 msec AV Vmax:           112.40 cm/s AV Vmean:          76.367 cm/s AV VTI:            0.171 m AV Peak Grad:      5.1 mmHg AV Mean Grad:      2.7 mmHg LVOT Vmax:         89.80 cm/s LVOT Vmean:        58.450 cm/s LVOT VTI:          0.163 m LVOT/AV VTI ratio: 0.95 AI PHT:            404 msec  AORTA Ao Root diam: 3.20 cm MITRAL VALVE                  TRICUSPID VALVE MV Area (PHT): 6.40 cm       TR Peak grad:   43.3 mmHg MV Decel Time: 119 msec       TR Vmax:        329.00 cm/s MR Peak grad:    98.8 mmHg MR Mean grad:    70.0 mmHg    SHUNTS MR Vmax:         497.00 cm/s  Systemic VTI:  0.16 m MR Vmean:        405.0 cm/s   Systemic Diam: 1.90 cm MR PISA:         3.08 cm MR PISA Eff ROA: 18 mm MR PISA Radius:  0.70 cm MV E velocity: 94.40 cm/s MV A velocity: 82.85 cm/s MV E/A ratio:  1.14 Dwayne D Callwood MD Electronically signed by Cara JONETTA Lovelace MD Signature Date/Time: 04/26/2023/4:08:58 PM    Final    DG Chest 2 View Result Date: 04/25/2023 CLINICAL DATA:  Short of breath EXAM: CHEST - 2 VIEW COMPARISON:  11/02/2010 FINDINGS: Frontal and lateral views of the chest demonstrate an enlarged cardiac silhouette. There are diffuse interstitial and ground-glass opacities throughout the lungs, with denser areas of consolidation at the lung bases. There are small bilateral pleural effusions. No pneumothorax. IMPRESSION: 1. Findings most consistent with moderate to severe congestive heart failure. Electronically Signed   By: Ozell Daring M.D.   On: 04/25/2023 20:18    Microbiology: Results for orders placed or performed during the hospital encounter of 04/26/23  Resp panel by RT-PCR (RSV, Flu A&B, Covid) Anterior Nasal Swab     Status: None   Collection Time: 04/25/23  5:27 PM  Specimen: Anterior Nasal Swab  Result Value Ref  Range Status   SARS Coronavirus 2 by RT PCR NEGATIVE NEGATIVE Final    Comment: (NOTE) SARS-CoV-2 target nucleic acids are NOT DETECTED.  The SARS-CoV-2 RNA is generally detectable in upper respiratory specimens during the acute phase of infection. The lowest concentration of SARS-CoV-2 viral copies this assay can detect is 138 copies/mL. A negative result does not preclude SARS-Cov-2 infection and should not be used as the sole basis for treatment or other patient management decisions. A negative result may occur with  improper specimen collection/handling, submission of specimen other than nasopharyngeal swab, presence of viral mutation(s) within the areas targeted by this assay, and inadequate number of viral copies(<138 copies/mL). A negative result must be combined with clinical observations, patient history, and epidemiological information. The expected result is Negative.  Fact Sheet for Patients:  bloggercourse.com  Fact Sheet for Healthcare Providers:  seriousbroker.it  This test is no t yet approved or cleared by the [place]  FDA and  has been authorized for detection and/or diagnosis of SARS-CoV-2 by FDA under an Emergency Use Authorization (EUA). This EUA will remain  in effect (meaning this test can be used) for the duration of the COVID-19 declaration under Section 564(b)(1) of the Act, 21 U.S.C.section 360bbb-3(b)(1), unless the authorization is terminated  or revoked sooner.       Influenza A by PCR NEGATIVE NEGATIVE Final   Influenza B by PCR NEGATIVE NEGATIVE Final    Comment: (NOTE) The Xpert Xpress SARS-CoV-2/FLU/RSV plus assay is intended as an aid in the diagnosis of influenza from Nasopharyngeal swab specimens and should not be used as a sole basis for treatment. Nasal washings and aspirates are unacceptable for Xpert Xpress SARS-CoV-2/FLU/RSV testing.  Fact Sheet for  Patients: bloggercourse.com  Fact Sheet for Healthcare Providers: seriousbroker.it  This test is not yet approved or cleared by the [place]  FDA and has been authorized for detection and/or diagnosis of SARS-CoV-2 by FDA under an Emergency Use Authorization (EUA). This EUA will remain in effect (meaning this test can be used) for the duration of the COVID-19 declaration under Section 564(b)(1) of the Act, 21 U.S.C. section 360bbb-3(b)(1), unless the authorization is terminated or revoked.     Resp Syncytial Virus by PCR NEGATIVE NEGATIVE Final    Comment: (NOTE) Fact Sheet for Patients: bloggercourse.com  Fact Sheet for Healthcare Providers: seriousbroker.it  This test is not yet approved or cleared by the [place]  FDA and has been authorized for detection and/or diagnosis of SARS-CoV-2 by FDA under an Emergency Use Authorization (EUA). This EUA will remain in effect (meaning this test can be used) for the duration of the COVID-19 declaration under Section 564(b)(1) of the Act, 21 U.S.C. section 360bbb-3(b)(1), unless the authorization is terminated or revoked.  Performed at [facility], [address] [place], [place] [address]     Labs: CBC: Recent Labs  Lab 04/25/23 1820 04/26/23 0547 04/27/23 1154 04/28/23 0406  WBC 7.4 7.5 6.0 6.5  NEUTROABS  --   --  3.9 3.3  HGB 12.1* 12.0* 12.5* 12.0*  HCT 37.4* 37.2* 38.5* 36.8*  MCV 91.9 93.7 91.7 91.3  PLT 351 307 341 366   Basic Metabolic Panel: Recent Labs  Lab 04/25/23 1727 04/26/23 0547 04/27/23 1154 04/28/23 0406  NA 138 139 136 137  K 4.0 3.6 3.2* 3.7  CL 107 108 100 101  CO2 22 19* 23 24  GLUCOSE 113* 123* 193* 107*  BUN 18 18  22 21  CREATININE 1.22 1.09 1.38* 1.23  CALCIUM 8.9 7.9* 8.1* 8.5*  MG  --   --   --  2.3  PHOS  --   --   --  3.2   Liver Function Tests: No results for  input(s): AST, ALT, ALKPHOS, BILITOT, PROT, ALBUMIN in the last 168 hours. CBG: Recent Labs  Lab 04/26/23 0753 04/26/23 1152 04/26/23 1557 04/27/23 0800  GLUCAP 121* 85 152* 68*    Discharge time spent: greater than 30 minutes.  Signed: Delon Herald, MD [facility] 04/28/2023

## 2023-04-28 NOTE — Evaluation (Signed)
 Occupational Therapy Evaluation Patient Details Name: Brandon Dillon. MRN: [id number] DOB: [birth year] Today's Date: 04/28/2023   History of Present Illness 89yo with h/o DM, HTN, HLD, and prostate CA who presented on 12/31 with SOB and LE edema. Likely new onset CHF. O2 sats into the 80s, started on 2L [place] O2. He was started on Lasix  with plan for cardiology consult and echocardiogram.  Echo with EF 25-30%, c/w new onset systolic CHF.   Clinical Impression   Pt was seen for OT evaluation this date. Pt up in the recliner, son present. Pt denies complaints. Son indicating that pt has been fairly independent with ADL, light IADL including driving, and assisting pt's wife with some ADL. Pt enjoys doing yard work as well. Pt presents to acute OT demonstrating impaired ADL performance and functional mobility 2/2 mild cognitive deficits at baseline and mild decreased activity tolerance (See OT problem list for additional functional deficits). Pt currently requires mod indep with ADL and mobility using RW. Pt/caregiver education in home/routines modifications, AE/DME, and safety strategies to support pt and pt's wife remaining as independent/safe as possible in their home. Son verbalizes understanding and reports he will look into recs as well as getting a ramp installed at the pt's home.  Do not anticipate the need for additional follow up OT services upon acute hospital DC at this time.     If plan is discharge home, recommend the following: Assistance with cooking/housework;Assist for transportation;Help with stairs or ramp for entrance    Functional Status Assessment  Patient has not had a recent decline in their functional status  Equipment Recommendations  None recommended by OT    Recommendations for Other Services       Precautions / Restrictions Precautions Precautions: Fall Restrictions Weight Bearing Restrictions Per Provider Order: No      Mobility Bed Mobility                General bed mobility comments: patient received in recliner    Transfers Overall transfer level: Modified independent Equipment used: Rolling walker (2 wheels)               General transfer comment: per PT      Balance Overall balance assessment: No apparent balance deficits (not formally assessed)                                         ADL either performed or assessed with clinical judgement   ADL                                         General ADL Comments: Pt appears to perform basic ADL near baseline.     Vision         Perception         Praxis         Pertinent Vitals/Pain Pain Assessment Pain Assessment: No/denies pain     Extremity/Trunk Assessment Upper Extremity Assessment Upper Extremity Assessment: Overall WFL for tasks assessed   Lower Extremity Assessment Lower Extremity Assessment: Overall WFL for tasks assessed   Cervical / Trunk Assessment Cervical / Trunk Assessment: Normal   Communication Communication Communication: Hearing impairment Cueing Techniques: Verbal cues   Cognition Arousal: Alert Behavior During Therapy: WFL for tasks assessed/performed Overall Cognitive Status: History of  cognitive impairments - at baseline                                 General Comments: Son reports hx of mild dementia     General Comments       Exercises Other Exercises Other Exercises: Pt/caregiver education in home/routines modifications, AE/DME, and safety strategies to support pt and pt's wife remaining as independent/safe as possible in their home.   Shoulder Instructions      Home Living Family/patient expects to be discharged to:: Private residence Living Arrangements: Spouse/significant other;Children Available Help at Discharge: Family Type of Home: House Home Access: Stairs to enter Secretary/administrator of Steps: 5-6 Entrance Stairs-Rails: Right;Left Home Layout: One  level               Home Equipment: Cane - single point;Hand held shower head;Grab bars - tub/shower;Shower seat          Prior Functioning/Environment Prior Level of Function : Independent/Modified Independent;Driving             Mobility Comments: patient does not use AD in home, he uses cane when out of home ADLs Comments: Independent, helps wife complete ADL. Son endorses concern pt may forget medications. Son reports pt still drives short familiar distances to church, etc.        OT Problem List: Decreased safety awareness      OT Treatment/Interventions:      OT Goals(Current goals can be found in the care plan section) Acute Rehab OT Goals Patient Stated Goal: son wishes for pt/pt's wife to be as safe as possible OT Goal Formulation: All assessment and education complete, DC therapy  OT Frequency:      Co-evaluation              AM-PAC OT 6 Clicks Daily Activity     Outcome Measure Help from another person eating meals?: None Help from another person taking care of personal grooming?: None Help from another person toileting, which includes using toliet, bedpan, or urinal?: None Help from another person bathing (including washing, rinsing, drying)?: None Help from another person to put on and taking off regular upper body clothing?: None Help from another person to put on and taking off regular lower body clothing?: None 6 Click Score: 24   End of Session    Activity Tolerance: Patient tolerated treatment well Patient left: in chair;with call bell/phone within reach;with family/visitor present  OT Visit Diagnosis: Other abnormalities of gait and mobility (R26.89)                Time: 1346-1401 OT Time Calculation (min): 15 min Charges:  OT General Charges $OT Visit: 1 Visit OT Evaluation $OT Eval Low Complexity: 1 Low  Warren SAUNDERS., MPH, MS, OTR/L ascom [phone] 04/28/23, 2:15 PM

## 2023-04-28 NOTE — Telephone Encounter (Signed)
 Patient Product/process development scientist completed.    The patient is insured through [facility]. Patient has Medicare and is not eligible for a copay card, but may be able to apply for patient assistance or Medicare RX Payment Plan (Patient Must reach out to their plan, if eligible for payment plan), if available.    Ran test claim for Entresto 24-26 mg and the current 30 day co-pay is $0.00.  Ran test claim for Farxiga 10 mg and the current 30 day co-pay is $0.00.  Ran test claim for Jardiance 10 mg and the current 30 day co-pay is $0.00.    This test claim was processed through [facility]- copay amounts may vary at other pharmacies due to pharmacy/plan contracts, or as the patient moves through the different stages of their insurance plan.     Roland Earl, CPHT Pharmacy Technician III Certified Patient Advocate [facility] Patient Advocate Team Direct Number: [phone]  Fax: [phone]

## 2023-04-28 NOTE — TOC Transition Note (Signed)
 Transition of Care [facility]) - Discharge Note   Patient Details  Name: Brandon Dillon. MRN: [id number] Date of Birth: May 13, 1927  Transition of [facility]) CM/SW Contact:  Sabriyah Wilcher C Pippa Hanif, RN Phone Number: 04/28/2023, 3:39 PM   Clinical Narrative:    Spoke with patient and his son at the bedside regarding therapy's recommendation for [facility]. Patient and son is agreeable and do not have a choice. They have been advised the accepting agency will call them to schedule the [facility] with in 48 hours. Patient son will pick his 4 wheeled RW up from [facility].  Referral sent to [facility].   TOC signing off.           Patient Goals and CMS Choice            Discharge Placement                       Discharge Plan and Services Additional resources added to the After Visit Summary for                                       Social Drivers of Health (SDOH) Interventions SDOH Screenings   Food Insecurity: No Food Insecurity (04/28/2023)  Housing: Low Risk  (04/28/2023)  Transportation Needs: No Transportation Needs (04/28/2023)  Utilities: Not At Risk (04/28/2023)  Alcohol Screen: Low Risk  (12/07/2022)  Depression (PHQ2-9): Low Risk  (12/07/2022)  Financial Resource Strain: Low Risk  (04/25/2023)   Received from [facility]  Physical Activity: Insufficiently Active (12/07/2022)  Social Connections: Unknown (04/28/2023)  Stress: No Stress Concern Present (12/07/2022)  Tobacco Use: Low Risk  (04/25/2023)     Readmission Risk Interventions     No data to display

## 2023-05-01 ENCOUNTER — Telehealth: Payer: Self-pay | Admitting: Family Medicine

## 2023-05-01 ENCOUNTER — Telehealth: Payer: Self-pay

## 2023-05-01 DIAGNOSIS — I5021 Acute systolic (congestive) heart failure: Secondary | ICD-10-CM

## 2023-05-01 NOTE — Telephone Encounter (Signed)
 Copied from CRM [id number]. Topic: Referral - Request for Referral >> May 01, 2023  2:35 PM Yvone Marda Blow wrote: Bari who is a [facility] Case Manager is asking Dr Joshua to do a referral for Pt Calica to: [facility]: [phone].  She is requesting that patient get a referral for Home health Nursing . Pt. And OT.SABRA   Please advise

## 2023-05-01 NOTE — Addendum Note (Signed)
 Addended by: Johnnette Barrios on: 05/01/2023 01:26 PM   Modules accepted: Orders

## 2023-05-01 NOTE — Transitions of Care (Post Inpatient/ED Visit) (Addendum)
 05/01/2023  Name: Brandon Dillon. MRN: [id number] DOB: 10/25/1927  Today's TOC FU Call Status: Today's TOC FU Call Status:: Successful TOC FU Call Completed TOC FU Call Complete Date: 05/01/23 Patient's Name and Date of Birth confirmed.  Transition Care Management Follow-up Telephone Call Date of Discharge: 04/28/23 Discharge Facility: [facility]) Type of Discharge: Inpatient Admission Primary Inpatient Discharge Diagnosis:: Acute systolic (congestive) heart failure How have you been since you were released from the hospital?: Better Any questions or concerns?: No  Items Reviewed: Did you receive and understand the discharge instructions provided?: Yes Medications obtained,verified, and reconciled?: Yes (Medications Reviewed) Any new allergies since your discharge?: No Dietary orders reviewed?: Yes Type of Diet Ordered:: Reg Heart Healthy, NAS Carb Modified Do you have support at home?: Yes People in Home: spouse Name of Support/Comfort Primary Source: Wife [name], Gilmer Brasil and friends and other family members  Medications Reviewed Today: Medications Reviewed Today     Reviewed by Willma Camelia CROME, RN (Registered Nurse) on 05/01/23 at 1244  Med List Status: <None>   Medication Order Taking? Sig Documenting Provider Last Dose Status Informant  aspirin  81 MG tablet [id number] Yes Take 81 mg by mouth daily. [provider] Taking Active Child  carvedilol  (COREG ) 3.125 MG tablet [id number] Yes Take 1 tablet (3.125 mg total) by mouth 2 (two) times daily with a meal. Barbarann Nest, MD Taking Active   dapagliflozin  propanediol (FARXIGA ) 5 MG TABS tablet [id number] Yes Take 1 tablet (5 mg total) by mouth daily. Barbarann Nest, MD Taking Active   donepezil  (ARICEPT ) 5 MG tablet [id number] Yes Take 1 tablet (5 mg total) by mouth at bedtime. Joshua Cathryne BROCKS, MD Taking Active Child  furosemide  (LASIX ) 20 MG tablet [id number] Yes Take 1 tablet (20 mg  total) by mouth daily. Barbarann Nest, MD Taking Active   gabapentin  (NEURONTIN ) 100 MG capsule [id number] Yes Take 1 capsule (100 mg total) by mouth at bedtime. Joshua Cathryne BROCKS, MD Taking Active Child  GE100 BLOOD GLUCOSE TEST test strip [id number] Yes CHECK BLOOD SUGAR ONCE DAILY OR AS DIRECTED Joshua Cathryne BROCKS, MD Taking Active Child  Lancets (FREESTYLE) lancets [id number] Yes AS DIRECTED Joshua Cathryne BROCKS, MD Taking Active Child  simvastatin  (ZOCOR ) 20 MG tablet [id number] Yes Take 1 tablet (20 mg total) by mouth every morning. Joshua Cathryne BROCKS, MD Taking Active Child  spironolactone  (ALDACTONE ) 25 MG tablet [id number] Yes Take 0.5 tablets (12.5 mg total) by mouth daily. Barbarann Nest, MD Taking Active   valsartan  (DIOVAN ) 40 MG tablet [id number] Yes Take 1 tablet (40 mg total) by mouth daily. Joshua Cathryne BROCKS, MD Taking Active Child          Medication reconciliation / review completed based on most recent discharge summary and EHR medication list. Confirmed patient is taking all newly prescribed medications as instructed and is aware of any changes to and / or dosage adjustments to medication regimen. Patient denies questions at this time and reports no barriers to medication adherence.   Home Care and Equipment/Supplies: Were Home Health Services Ordered?: Yes Name of Home Health Agency:: [facility] [phone] Has Agency set up a time to come to your home?: No EMR reviewed for Home Health Orders: Orders present/patient has not received call (refer to CM for follow-up) Any new equipment or medical supplies ordered?: No  Functional Questionnaire: Do you need assistance with bathing/showering or dressing?: No Do you need assistance with meal preparation?: Yes (spouse prepares) Do you need assistance with eating?:  No Do you have difficulty maintaining continence: No Do you need assistance with getting out of bed/getting out of a chair/moving?: No (uses walker , they have a BSC)  Follow up  appointments reviewed: PCP Follow-up appointment confirmed?: Yes Date of PCP follow-up appointment?: 05/16/23 Follow-up Provider: Cathryne Molt Specialist [facility] Follow-up appointment confirmed?: Yes Date of Specialist follow-up appointment?: 05/02/23 Follow-Up Specialty Provider:: Per son he cancelled and is reschedung labs , and Dexa . Multiple  appointments Do you need transportation to your follow-up appointment?: No (per son he drives but feels thuey may need help with transport) Do you understand care options if your condition(s) worsen?: Yes-patient verbalized understanding  SDOH Interventions Today    Flowsheet Row Most Recent Value  SDOH Interventions   Food Insecurity Interventions Intervention Not Indicated  Housing Interventions Intervention Not Indicated  Transportation Interventions Intervention Not Indicated, Patient Resources (Friends/Family)  Utilities Interventions Intervention Not Indicated  Social Connections Interventions Intervention Not Indicated       Call placed to [facility]( 3 locations) They report no referral for this patient  Per EHR   Referral sent to [place] [facility] [email or web address]  Transition of Care [facility]) CM/SW Keona C Alston, RN  04/28/2023, 3:39 PM  Patient is at high risk for readmission and/or has history of  high utilization  Discussed VBCI  TOC program and weekly calls to patient to assess condition/status, medication management  and provide support/education as indicated . Patient/ Caregiver voiced understanding and is  agreeable to 30 day program   Routine follow-up and on-going assessment evaluation and education of disease processes, recommended interventions for both chronic and acute medical conditions , will occur during each weekly visit along with ongoing review of symptoms ,medication reviews and reconciliation. Any updates , inconsistencies, discrepancies or acute care concerns will be addressed and routed to the correct Practitioner if  indicated   Based on current information and Insurance plan -Reviewed benefits available to patient, including details about eligibility options for care if any area of needs were identified.  Reviewed patients ability to access and / or navigating the benefits system..Amb Referral made, refer to orders section of note for details   Please refer to Care Plan for goals and interventions -Effectiveness of interventions, symptom management and outcomes will be evaluated  weekly during [facility] 30-day Program Outreach calls  . Any necessary  changes and updates to Care Plan will be completed episodically    Reviewed goals for care Patient verbalizes understanding of instructions and care plan provided. Patient was encouraged to make informed decisions about their care, actively participate in managing their health condition, and implement lifestyle changes as needed to promote independence and self-management of health care    The patient has been provided with contact information for the care management team and has been advised to call with any health-related questions or concerns.    Bari Mayans , BSN, RN Care Management Coordinator [place]   [facility] [email or web address] Direct Dial: [phone]

## 2023-05-01 NOTE — Patient Instructions (Signed)
 Visit Information  Thank you for taking time to visit with me today. Please don't hesitate to contact me if I can be of assistance to you before our next scheduled telephone appointment.  Our next appointment is by telephone on 05/10/23 at 1:00pm  Following is a copy of your care plan:   Goals Addressed             This Visit's Progress    TOC Care Plan       Current Barriers:  Chronic Disease Management support and education needs related to CHF  Cognitive Deficits  RNCM Clinical Goal(s):  Patient will work with the Care Management team over the next 30 days to address Transition of Care Barriers: Medication Management Support at home Provider appointments Home Health services Functional/Safety Transportation take all medications exactly as prescribed and will call provider for medication related questions as evidenced by no missed medication doses attend all scheduled medical appointments: with PCP, Cardiology, Pulmonology,and HH  as evidenced by no missed follow -up appointments  work with child psychotherapist to address  related to the management of Transportation, Level of care concerns, ADL IADL limitations, and Cognitive Deficits related to the management of CHF as evidenced by review of EMR and patient or child psychotherapist report through collaboration with Medical Illustrator, provider, and care team.   Interventions: Evaluation of current treatment plan related to  self management and patient's adherence to plan as established by provider  Transitions of Care:  New goal. Community Resource Referral Made to address Transportation Physical Activity Doctor Visits  - discussed the importance of doctor visits  Heart Failure Interventions:  (Status:  New goal.) Short Term Goal Provided education on low sodium diet Provided education about placing scale on hard, flat surface Discussed importance of daily weight and advised patient to weigh and record daily Reviewed role of diuretics in  prevention of fluid overload and management of heart failure; Discussed the importance of keeping all appointments with provider Provided patient with education about the role of exercise in the management of heart failure Referral made to community resources care guide team for assistance with hekp at home, transportation, possibly needing a ramp built  Advised patient to discuss medicatins and Blood glucose testing  with provider Assessed social determinant of health barriers   Patient Goals/Self-Care Activities: Participate in Transition of Care Program/Attend TOC scheduled calls Take all medications as prescribed Attend all scheduled provider appointments Call pharmacy for medication refills 3-7 days in advance of running out of medications Attend church or other social activities Perform all self care activities independently  Perform IADL's (shopping, preparing meals, housekeeping, managing finances) independently Call provider office for new concerns or questions   Follow Up Plan:  Telephone follow up appointment with care management team member scheduled for:  05/10/23 1 00pm The patient has been provided with contact information for the care management team and has been advised to call with any health related questions or concerns.          Reviewed goals for care Patient/ Caregiver verbalizes understanding of instructions with the plan of care . The  Patient / Caregiver was encouraged to make informed decisions about care, actively participate in managing health conditions, and implement lifestyle changes as needed to promote independence and self-management of healthcare. SDOH screenings have been completed and addressed if indicted There are no reported barriers to care.    Follow-up Plan VBCI Case Management Nurse will provide follow-up and on-going assessment ,evaluation and education  of disease processes, recommended interventions for both chronic and acute medical  conditions ,  along with ongoing review of symptoms ,medication reviews / reconciliation during each weekly call . Any updates , inconsistencies, discrepancies or acute care concerns will be addressed and routed to the correct Practitioner if indicated   [facility]  Please call the care guide team at [phone]  if you need to cancel or reschedule your appointment . For scheduled calls -Three attempts will be made to reach you -if the scheduled call is missed or  we are unable to reach the you after 3 attempts no additional outreach attempts will be made and the TOC follow-up will be closed .   If you need to speak to a Nurse you may  call me directly at the number below or if I am unavailable,and  your need is urgent  please call the main VBCI number at [phone] and ask to speak with one of the [facility] ( Transition of Care )  Nurses  .                                                                               Additionally, If you experience worsening of your symptoms, develop shortness of breath, If you are experiencing a medical emergency,  develop suicidal or homicidal thoughts you must seek medical attention immediately by calling 911 or report to your local emergency department or urgent care.   If you have a non-emergency medical problem during routine business hours, please contact your provider's office and ask to speak with a nurse.       Please take the time to read instructions/literature along with the possible adverse reactions/side effects for all the Medicines that have been prescribed to you. Only take newly prescribed  Medications after you have completely understood and accept all the possible adverse reactions/side effects.   Do not take more than prescribed Medications for  Pain, Sleep and Anxiety. Do not drive when taking Pain medications or sleep aid/ insomnia  medications It is not advisable to combine anxiety, sleep and pain medications without talking  with your primary care practitioner    If you are experiencing a Mental Health or Behavioral Health Crisis or need someone to talk to Please call the Suicide and Crisis Lifeline: 46 You may also call the [place]  National Suicide Prevention Lifeline: (437) 741-4419 or TTY: 754-715-6880 TTY (986)695-5386) to talk to a trained counselor.  You may call the Behavioral Health Crisis Line at 508-794-2965, at any time, 24 hours a day, 7 days a week- however If you are in danger or need immediate medical attention, call 911.   If you would like help to quit smoking, call 1-800-QUIT-NOW ( (514)016-3977) OR Espaol: 1-855-Djelo-Ya (8-144-664-6430) o para ms informacin haga clic aqu or Text READY to 799-599 to register via text.   Bari Mayans , BSN, RN Care Management Coordinator [place]   [facility] [email or web address] Direct Dial: [phone]

## 2023-05-01 NOTE — Consult Note (Signed)
 [facility] Main Liaison Note  05/01/2023  Brandon Dillon [birth year] [id number]  Location: RN Hospital Liaison screened the patient remotely at [facility].  Insurance: [employer]   [employer] Brandon Febo. is a 89 y.o. male who is a Primary Care Patient of Joshua Cathryne BROCKS, MD [facility] [place]. The patient was screened for  readmission hospitalization with noted medium risk score for unplanned readmission risk with 1 IP in 6 months.  The patient was assessed for potential Care Management service needs for post hospital transition for care coordination. Review of patient's electronic medical record reveals patient was admitted with Acute Systolic heart failure. Liaison spoke with the son Memory) concerning VBCI care management services and offered services for post hospital discharge prevention readmission and care management services for nurse care coordinator and social worker for community resources. Son receptive to this services for both parents. Pt requested all calls directed to him due to the pt's having some memory issues.   PLAN: Liaison will make a referral for VBCI services RNCM and SW to completed follow up call. This office has VBCI listed for TOC for anticipated outreach.    VBCI Care Management/Population Health does not replace or interfere with any arrangements made by the Inpatient Transition of Care team.   For questions contact:   Olam Ku, RN, [facility] Liaison [place]   [facility], Population Health Office Hours MTWF  8:00 am-6:00 pm Direct Dial: [phone] mobile [phone] [Office toll free line] Office Hours are M-F 8:30 - 5 pm [email or web address]

## 2023-05-02 ENCOUNTER — Inpatient Hospital Stay: Payer: 59 | Attending: Oncology

## 2023-05-02 ENCOUNTER — Telehealth: Payer: Self-pay | Admitting: *Deleted

## 2023-05-02 ENCOUNTER — Other Ambulatory Visit: Payer: 59

## 2023-05-02 ENCOUNTER — Other Ambulatory Visit: Payer: Self-pay

## 2023-05-02 DIAGNOSIS — R413 Other amnesia: Secondary | ICD-10-CM

## 2023-05-02 DIAGNOSIS — R7303 Prediabetes: Secondary | ICD-10-CM

## 2023-05-02 DIAGNOSIS — G2581 Restless legs syndrome: Secondary | ICD-10-CM

## 2023-05-02 NOTE — Progress Notes (Signed)
 Complex Care Management Note  Care Guide Note 05/02/2023 Name: Brandon Dillon. MRN: [id number] DOB: 01-20-1928  Brandon Dillon. is a 89 y.o. year old male who sees Joshua Cathryne BROCKS, MD for primary care. I reached out to [employer]. by phone today to offer complex care management services.  Mr. Kotarski was given information about Complex Care Management services today including:   The Complex Care Management services include support from the care team which includes your Nurse Coordinator, Clinical Social Worker, or Pharmacist.  The Complex Care Management team is here to help remove barriers to the health concerns and goals most important to you. Complex Care Management services are voluntary, and the patient may decline or stop services at any time by request to their care team member.   Complex Care Management Consent Status: Patient agreed to services and verbal consent obtained.   Follow up plan:  Telephone appointment with complex care management team member scheduled for:  05/09/2023  Encounter Outcome:  Patient Scheduled  Thedford Franks, [facility] Care Coordination Care Guide Direct Dial: [phone]

## 2023-05-03 NOTE — Telephone Encounter (Signed)
 Referral was placed yesterday by Angelica.

## 2023-05-04 DIAGNOSIS — E1140 Type 2 diabetes mellitus with diabetic neuropathy, unspecified: Secondary | ICD-10-CM | POA: Diagnosis not present

## 2023-05-04 DIAGNOSIS — M1990 Unspecified osteoarthritis, unspecified site: Secondary | ICD-10-CM | POA: Diagnosis not present

## 2023-05-04 DIAGNOSIS — E1151 Type 2 diabetes mellitus with diabetic peripheral angiopathy without gangrene: Secondary | ICD-10-CM | POA: Diagnosis not present

## 2023-05-04 DIAGNOSIS — I5021 Acute systolic (congestive) heart failure: Secondary | ICD-10-CM | POA: Diagnosis not present

## 2023-05-04 DIAGNOSIS — E785 Hyperlipidemia, unspecified: Secondary | ICD-10-CM | POA: Diagnosis not present

## 2023-05-04 DIAGNOSIS — I110 Hypertensive heart disease with heart failure: Secondary | ICD-10-CM | POA: Diagnosis not present

## 2023-05-09 ENCOUNTER — Ambulatory Visit: Payer: Self-pay

## 2023-05-09 DIAGNOSIS — E1140 Type 2 diabetes mellitus with diabetic neuropathy, unspecified: Secondary | ICD-10-CM | POA: Diagnosis not present

## 2023-05-09 DIAGNOSIS — M1990 Unspecified osteoarthritis, unspecified site: Secondary | ICD-10-CM | POA: Diagnosis not present

## 2023-05-09 DIAGNOSIS — I5021 Acute systolic (congestive) heart failure: Secondary | ICD-10-CM | POA: Diagnosis not present

## 2023-05-09 DIAGNOSIS — E1151 Type 2 diabetes mellitus with diabetic peripheral angiopathy without gangrene: Secondary | ICD-10-CM | POA: Diagnosis not present

## 2023-05-09 DIAGNOSIS — E785 Hyperlipidemia, unspecified: Secondary | ICD-10-CM | POA: Diagnosis not present

## 2023-05-09 DIAGNOSIS — I110 Hypertensive heart disease with heart failure: Secondary | ICD-10-CM | POA: Diagnosis not present

## 2023-05-09 NOTE — Patient Outreach (Signed)
  Care Coordination   Initial Visit Note   05/09/2023 Name: Brandon Dillon. MRN: [id number] DOB: [birth year]  Brandon Dillon. is a 89 y.o. year old male who sees Brandon Cathryne BROCKS, MD for primary care. I spoke with  Brandon Dillon. And caregiver Brandon Dillon by phone today.  What matters to the patients health and wellness today?  Patient has a family member providing care, but needs additional support. SW is not able to complete assessment as patient is confused.    Goals Addressed             This Visit's Progress    PCS       Interventions Today    Flowsheet Row Most Recent Value  General Interventions   General Interventions Discussed/Reviewed General Interventions Discussed, General Interventions Reviewed, Community Resources, Level of Care  [Pt struggles to hear understand SW. Brandon Dillon is caregiver/family speaks to SW.  The family cant afford to pay for PCS and the insurance does not cover. Ms. Dillon agreed to continue to provide care.]  Level of Care Personal Care Services  [SW offered agencies in the community for personal care but Ms. Clark reports they can't afford to pay anything.]              SDOH assessments and interventions completed:  No     Care Coordination Interventions:  Yes, provided   Follow up plan: No further intervention required.   Encounter Outcome:  Patient Visit Completed

## 2023-05-09 NOTE — Patient Instructions (Signed)
 Visit Information  Thank you for taking time to visit with me today. Please don't hesitate to contact me if I can be of assistance to you.   Following are the goals we discussed today:  Patient caregiver Ms. Gretta will continue to provide care.    If you are experiencing a Mental Health or Behavioral Health Crisis or need someone to talk to, please call 911  Patient verbalizes understanding of instructions and care plan provided today and agrees to view in MyChart. Active MyChart status and patient understanding of how to access instructions and care plan via MyChart confirmed with patient.     No further follow up required: Patient does not request a follow up visit.  Tillman Gardener, BSW Social Worker [phone]

## 2023-05-10 ENCOUNTER — Telehealth: Payer: Self-pay | Admitting: Family Medicine

## 2023-05-10 ENCOUNTER — Other Ambulatory Visit: Payer: Self-pay

## 2023-05-10 NOTE — Patient Outreach (Signed)
  Care Management  Transitions of Care Program Transitions of Care Post-discharge week 2  05/10/2023 Name: Brandon Dillon. MRN: [id number] DOB: 05-Sep-1927  Subjective: Brandon Dillon. is a 89 y.o. year old male who is a primary care patient of Clarise Crooks, MD. The Care Management team was unable to reach the patient by phone to assess and address transitions of care needs. Patient was called in an Outreach attempt to offer VBCI  30-day TOC program. Pt is eligible for program due to potential risk for readmission and/or high utilization. Unfortunately, I was not able to speak with the patient in regards to recent hospital discharge  Unable to Lm due to generic voice mail   Plan: Additional outreach attempts will be made to reach the patient enrolled in the [facility] Program (Post Inpatient/ED Visit).  James Mcardle , BSN, RN [facility]   [facility] RN Care Manager Direct Dial [phone] Fax [phone] Website: [email or web address]

## 2023-05-10 NOTE — Telephone Encounter (Signed)
 Called gave verbal.  KP

## 2023-05-10 NOTE — Telephone Encounter (Signed)
 Home Health Verbal Orders - Caller/Agency: Zacharia OT - [facility]  Callback Number: [phone] Service Requested: Occupational Therapy Frequency: 1x 7weeks  Any new concerns about the patient? No

## 2023-05-11 ENCOUNTER — Telehealth: Payer: Self-pay

## 2023-05-11 NOTE — Patient Outreach (Signed)
  Care Management  Transitions of Care Program Transitions of Care Post-discharge week 2  05/11/2023 Name: [name]. MRN: [id number] DOB: Jul 30, 1927  Subjective: Brandon Dillon. is a 89 y.o. year old male who is a primary care patient of Duanne Limerick, MD. The Care Management team was unable to reach the patient by phone to assess and address transitions of care needs.Patient was called in an Outreach attempt for week 2 of VBCI  30-day TOC program.  Unfortunately, I was not able to speak with the patient in regards to recent hospital discharge  The person who answered the phone hung up after I gave contact information     Plan: Additional outreach attempts will be made to reach the patient enrolled in the [facility] Program (Post Inpatient/ED Visit).  Susa Loffler , BSN, RN [facility]   [facility] RN Care Manager Direct Dial [phone] Fax [phone] Website: [email or web address]

## 2023-05-12 ENCOUNTER — Telehealth: Payer: Self-pay

## 2023-05-12 DIAGNOSIS — I5021 Acute systolic (congestive) heart failure: Secondary | ICD-10-CM | POA: Diagnosis not present

## 2023-05-12 DIAGNOSIS — E785 Hyperlipidemia, unspecified: Secondary | ICD-10-CM | POA: Diagnosis not present

## 2023-05-12 DIAGNOSIS — I110 Hypertensive heart disease with heart failure: Secondary | ICD-10-CM | POA: Diagnosis not present

## 2023-05-12 DIAGNOSIS — M1990 Unspecified osteoarthritis, unspecified site: Secondary | ICD-10-CM | POA: Diagnosis not present

## 2023-05-12 DIAGNOSIS — E1140 Type 2 diabetes mellitus with diabetic neuropathy, unspecified: Secondary | ICD-10-CM | POA: Diagnosis not present

## 2023-05-12 DIAGNOSIS — E1151 Type 2 diabetes mellitus with diabetic peripheral angiopathy without gangrene: Secondary | ICD-10-CM | POA: Diagnosis not present

## 2023-05-12 NOTE — Patient Outreach (Signed)
  Care Management  Transitions of Care Program Transitions of Care Post-discharge week 2  05/12/2023 Name: Brandon Dillon. MRN: [id number] DOB: 1927/05/29  Subjective: Brandon Dillon. is a 89 y.o. year old male who is a primary care patient of Duanne Limerick, MD. The Care Management team was unable to reach the patient by phone to assess and address transitions of care needs. Patient was called in an Outreach attempt to continue VBCI  30-day TOC program week 2  Call 1 05/10/23 Call 2 05/11/23 Call 3 05/12/23     Left message for patient including VBCI CM contact information with request for a call back in regard to recent hospital discharge     Plan: No further outreach attempts will be made at this time.  We have been unable to reach the patient. Susa Loffler , BSN, RN [facility]   [facility] RN Care Manager Direct Dial [phone] Fax [phone] Website: [email or web address]

## 2023-05-16 ENCOUNTER — Ambulatory Visit (INDEPENDENT_AMBULATORY_CARE_PROVIDER_SITE_OTHER): Payer: 59 | Admitting: Family Medicine

## 2023-05-16 ENCOUNTER — Encounter: Payer: Self-pay | Admitting: Family Medicine

## 2023-05-16 VITALS — BP 132/64 | HR 66 | Ht 74.0 in | Wt 201.0 lb

## 2023-05-16 DIAGNOSIS — R7303 Prediabetes: Secondary | ICD-10-CM | POA: Diagnosis not present

## 2023-05-16 DIAGNOSIS — I10 Essential (primary) hypertension: Secondary | ICD-10-CM | POA: Diagnosis not present

## 2023-05-16 DIAGNOSIS — G2581 Restless legs syndrome: Secondary | ICD-10-CM

## 2023-05-16 DIAGNOSIS — E782 Mixed hyperlipidemia: Secondary | ICD-10-CM

## 2023-05-16 DIAGNOSIS — R413 Other amnesia: Secondary | ICD-10-CM | POA: Diagnosis not present

## 2023-05-16 DIAGNOSIS — I5022 Chronic systolic (congestive) heart failure: Secondary | ICD-10-CM | POA: Diagnosis not present

## 2023-05-16 MED ORDER — CARVEDILOL 3.125 MG PO TABS: 3.1250 mg | ORAL_TABLET | Freq: Two times a day (BID) | ORAL | 1 refills | Status: DC

## 2023-05-16 MED ORDER — SPIRONOLACTONE 25 MG PO TABS: 12.5000 mg | ORAL_TABLET | Freq: Every day | ORAL | 1 refills | Status: DC

## 2023-05-16 MED ORDER — DONEPEZIL HCL 5 MG PO TABS: 5.0000 mg | ORAL_TABLET | Freq: Every day | ORAL | 0 refills | Status: AC

## 2023-05-16 MED ORDER — FUROSEMIDE 20 MG PO TABS: 20.0000 mg | ORAL_TABLET | Freq: Every day | ORAL | 1 refills | Status: DC

## 2023-05-16 MED ORDER — SIMVASTATIN 20 MG PO TABS: 20.0000 mg | ORAL_TABLET | Freq: Every morning | ORAL | 0 refills | Status: DC

## 2023-05-16 MED ORDER — DAPAGLIFLOZIN PROPANEDIOL 5 MG PO TABS: 5.0000 mg | ORAL_TABLET | Freq: Every day | ORAL | 1 refills | Status: DC

## 2023-05-16 MED ORDER — GABAPENTIN 100 MG PO CAPS: 100.0000 mg | ORAL_CAPSULE | Freq: Every day | ORAL | 5 refills | Status: AC

## 2023-05-16 MED ORDER — VALSARTAN 40 MG PO TABS: 40.0000 mg | ORAL_TABLET | Freq: Every day | ORAL | 1 refills | Status: AC

## 2023-05-16 NOTE — Progress Notes (Signed)
Date:  05/16/2023   Name:  Brandon Dillon.   DOB:  [birth year]   MRN:  [id number]   Chief Complaint: Hypertension and Hyperlipidemia  Hypertension This is a chronic problem. The current episode started more than 1 year ago. The problem has been gradually improving since onset. The problem is controlled. Pertinent negatives include no anxiety, chest pain, malaise/fatigue, neck pain, orthopnea, palpitations, peripheral edema, PND or shortness of breath. There are no associated agents to hypertension. Risk factors for coronary artery disease include diabetes mellitus and dyslipidemia. Past treatments include beta blockers, alpha 1 blockers and angiotensin blockers. The current treatment provides moderate improvement. There are no compliance problems.   Hyperlipidemia This is a chronic problem. The current episode started more than 1 year ago. The problem is controlled. Recent lipid tests were reviewed and are normal. There are no known factors aggravating his hyperlipidemia. Associated symptoms include a focal sensory loss. Pertinent negatives include no chest pain, focal weakness, leg pain, myalgias or shortness of breath. (Numbnesas if sit) The current treatment provides moderate improvement of lipids. There are no compliance problems.   Neurologic Problem The patient's primary symptoms include focal sensory loss. The patient's pertinent negatives include no focal weakness or syncope. Primary symptoms comment: cognitive/restless leg. The current episode started more than 1 year ago. The problem has been gradually improving since onset. Pertinent negatives include no abdominal pain, back pain, bladder incontinence, bowel incontinence, chest pain, dizziness, fatigue, fever, neck pain, palpitations, shortness of breath or vomiting. Past treatments include nothing. The treatment provided moderate relief.    Lab Results  Component Value Date   NA 137 04/28/2023   K 3.7 04/28/2023   CO2 24  04/28/2023   GLUCOSE 107 (H) 04/28/2023   BUN 21 04/28/2023   CREATININE 1.23 04/28/2023   CALCIUM 8.5 (L) 04/28/2023   EGFR 44 (L) 04/12/2021   GFRNONAA 55 (L) 04/28/2023   Lab Results  Component Value Date   CHOL 188 02/22/2022   HDL 58 02/22/2022   LDLCALC 115 (H) 02/22/2022   TRIG 79 02/22/2022   CHOLHDL 3.2 04/04/2018   No results found for: "TSH" Lab Results  Component Value Date   HGBA1C 5.5 04/26/2023   Lab Results  Component Value Date   WBC 6.5 04/28/2023   HGB 12.0 (L) 04/28/2023   HCT 36.8 (L) 04/28/2023   MCV 91.3 04/28/2023   PLT 366 04/28/2023   Lab Results  Component Value Date   ALT 9 01/23/2023   AST 13 (L) 01/23/2023   ALKPHOS 106 01/23/2023   BILITOT 0.9 01/23/2023   No results found for: "25OHVITD2", "25OHVITD3", "VD25OH"   Review of Systems  Constitutional:  Negative for fatigue, fever, malaise/fatigue and unexpected weight change.  HENT:  Negative for congestion.   Eyes:  Negative for redness and visual disturbance.  Respiratory:  Negative for choking, chest tightness, shortness of breath and wheezing.   Cardiovascular:  Negative for chest pain, palpitations, orthopnea, leg swelling and PND.  Gastrointestinal:  Negative for abdominal pain, bowel incontinence and vomiting.  Endocrine: Negative for polydipsia and polyuria.  Genitourinary:  Negative for bladder incontinence, difficulty urinating and dysuria.  Musculoskeletal:  Negative for arthralgias, back pain, joint swelling, myalgias and neck pain.  Neurological:  Negative for dizziness, focal weakness and syncope.    Patient Active Problem List   Diagnosis Date Noted   Acute systolic (congestive) heart failure (HCC) 04/26/2023   Type 2 diabetes mellitus with peripheral neuropathy (HCC) 04/26/2023  Dyslipidemia 04/26/2023   Dementia without behavioral disturbance (HCC) 04/26/2023   Benign neoplasm of eyelid, left 07/06/2021   Conjunctival lesion 07/06/2021   Exposure keratopathy,  bilateral 07/06/2021   Punctal stenosis, acquired, left 07/06/2021   Senile ectropion of both lower eyelids 07/06/2021   Idiopathic peripheral neuropathy 08/12/2020   Prediabetes 08/12/2020   Goals of care, counseling/discussion 08/21/2019   Leg pain 08/16/2018   PAD (peripheral artery disease) (HCC) 08/16/2018   DJD (degenerative joint disease) 08/16/2018   Essential hypertension 05/11/2015   Primary prostate adenocarcinoma (HCC) 05/11/2015   Hyperlipidemia 05/11/2015   Prostate hypertrophy 05/11/2015   Taking multiple medications for chronic disease 05/11/2015   Benign prostatic hyperplasia with urinary obstruction 12/07/2014   Elevated prostate specific antigen (PSA) 12/04/2014    Allergies  Allergen Reactions   Lisinopril     coughing  Other Reaction(s): Unknown  coughing coughing    coughing    Past Surgical History:  Procedure Laterality Date   CATARACT EXTRACTION W/PHACO Right 09/27/2017   Procedure: CATARACT EXTRACTION PHACO AND INTRAOCULAR LENS PLACEMENT (IOC) RIGHT;  Surgeon: Lockie Mola, MD;  Location: [facility];  Service: Ophthalmology;  Laterality: Right;  IVA TOPICAL DIABETES   CATARACT EXTRACTION W/PHACO Left 10/18/2017   Procedure: CATARACT EXTRACTION PHACO AND INTRAOCULAR LENS PLACEMENT (IOC)  LEFT DIABETIC;  Surgeon: Lockie Mola, MD;  Location: [facility];  Service: Ophthalmology;  Laterality: Left;  DIABETIC-ORAL MED   COLONOSCOPY  2011   normal- Dr?    Social History   Tobacco Use   Smoking status: Never   Smokeless tobacco: Never  Vaping Use   Vaping status: Never Used  Substance Use Topics   Alcohol use: No    Alcohol/week: 0.0 standard drinks of alcohol   Drug use: No     Medication list has been reviewed and updated.  Current Meds  Medication Sig   aspirin 81 MG tablet Take 81 mg by mouth daily.   carvedilol (COREG) 3.125 MG tablet Take 1 tablet (3.125 mg total) by mouth 2 (two) times daily with a  meal.   dapagliflozin propanediol (FARXIGA) 5 MG TABS tablet Take 1 tablet (5 mg total) by mouth daily.   donepezil (ARICEPT) 5 MG tablet Take 1 tablet (5 mg total) by mouth at bedtime.   furosemide (LASIX) 20 MG tablet Take 1 tablet (20 mg total) by mouth daily.   gabapentin (NEURONTIN) 100 MG capsule Take 1 capsule (100 mg total) by mouth at bedtime.   GE100 BLOOD GLUCOSE TEST test strip CHECK BLOOD SUGAR ONCE DAILY OR AS DIRECTED   Lancets (FREESTYLE) lancets AS DIRECTED   simvastatin (ZOCOR) 20 MG tablet Take 1 tablet (20 mg total) by mouth every morning.   spironolactone (ALDACTONE) 25 MG tablet Take 0.5 tablets (12.5 mg total) by mouth daily.   valsartan (DIOVAN) 40 MG tablet Take 1 tablet (40 mg total) by mouth daily.       05/16/2023   10:15 AM 11/10/2022    2:04 PM 08/23/2022   11:06 AM 02/22/2022    9:57 AM  GAD 7 : Generalized Anxiety Score  Nervous, Anxious, on Edge 0 0 0 0  Control/stop worrying 0 0 0 0  Worry too much - different things 0 0 0 0  Trouble relaxing 0 0 0 0  Restless 0 0 0 0  Easily annoyed or irritable 0 0 0 0  Afraid - awful might happen 0 0 0 0  Total GAD 7 Score 0 0 0 0  Anxiety Difficulty Not difficult at all Not difficult at all Not difficult at all Not difficult at all       05/16/2023   10:15 AM 12/07/2022    8:39 AM 11/10/2022    2:04 PM  Depression screen PHQ 2/9  Decreased Interest 0 0 0  Down, Depressed, Hopeless 0 0 0  PHQ - 2 Score 0 0 0  Altered sleeping 0 0 0  Tired, decreased energy 0 0 0  Change in appetite 0 0 0  Feeling bad or failure about yourself  0 0 0  Trouble concentrating 0 0 0  Moving slowly or fidgety/restless 0 0 0  Suicidal thoughts 0 0 0  PHQ-9 Score 0 0 0  Difficult doing work/chores Not difficult at all Not difficult at all Not difficult at all    BP Readings from Last 3 Encounters:  05/16/23 132/64  04/28/23 (!) 114/102  02/07/23 (!) 186/77    Physical Exam Vitals and nursing note reviewed.   Constitutional:      Appearance: He is well-developed.  HENT:     Head: Normocephalic and atraumatic.     Right Ear: Tympanic membrane, ear canal and external ear normal.     Left Ear: Tympanic membrane, ear canal and external ear normal.     Nose: Nose normal. No congestion or rhinorrhea.     Mouth/Throat:     Dentition: Normal dentition.  Eyes:     General: Lids are normal. No scleral icterus.    Conjunctiva/sclera: Conjunctivae normal.     Pupils: Pupils are equal, round, and reactive to light.  Neck:     Thyroid: No thyromegaly.     Vascular: No carotid bruit, hepatojugular reflux or JVD.     Trachea: No tracheal deviation.  Cardiovascular:     Rate and Rhythm: Normal rate and regular rhythm.     Heart sounds: Normal heart sounds, S1 normal and S2 normal. No murmur heard.    No systolic murmur is present.     No diastolic murmur is present.     No friction rub. No gallop. No S3 or S4 sounds.  Pulmonary:     Effort: Pulmonary effort is normal.     Breath sounds: Normal breath sounds. No wheezing, rhonchi or rales.  Abdominal:     General: Bowel sounds are normal.     Palpations: Abdomen is soft. There is no hepatomegaly, splenomegaly or mass.     Tenderness: There is no abdominal tenderness. There is no right CVA tenderness.     Hernia: There is no hernia in the left inguinal area.  Musculoskeletal:        General: Normal range of motion.     Cervical back: Normal range of motion and neck supple.  Lymphadenopathy:     Cervical: No cervical adenopathy.  Skin:    General: Skin is warm and dry.     Findings: No rash.  Neurological:     Mental Status: He is alert and oriented to person, place, and time.     Sensory: No sensory deficit.     Deep Tendon Reflexes: Reflexes are normal and symmetric.  Psychiatric:        Mood and Affect: Mood is not anxious or depressed.     Wt Readings from Last 3 Encounters:  05/16/23 201 lb (91.2 kg)  04/28/23 198 lb 3.2 oz (89.9 kg)   02/07/23 245 lb (111.1 kg)    BP 132/64   Pulse 66  Ht 6\' 2"  (1.88 m)   Wt 201 lb (91.2 kg)   BMI 25.81 kg/m   Assessment and Plan:  1. Essential hypertension (Primary) Chronic.  Controlled.  Stable.  Blood pressure today is 132/64.  Asymptomatic.  Tolerating medication well.  Recently had other medications added on given his recent mission for this acute systolic congestive heart failure.  Considering that he probably has some baseline chronic patient is currently on valsartan 40 mg once a day, carvedilol 3.1251 twice a day, furosemide 20 mg once a day.  And spironolactone 25 mg 1/2 tablet daily. - valsartan (DIOVAN) 40 MG tablet; Take 1 tablet (40 mg total) by mouth daily.  Dispense: 90 tablet; Refill: 1 - carvedilol (COREG) 3.125 MG tablet; Take 1 tablet (3.125 mg total) by mouth 2 (two) times daily with a meal.  Dispense: 60 tablet; Refill: 1 - furosemide (LASIX) 20 MG tablet; Take 1 tablet (20 mg total) by mouth daily.  Dispense: 30 tablet; Refill: 1 - spironolactone (ALDACTONE) 25 MG tablet; Take 0.5 tablets (12.5 mg total) by mouth daily.  Dispense: 15 tablet; Refill: 1  2. Mixed hyperlipidemia Chronic.  Controlled.  Stable.  Asymptomatic.  Tolerating current statin simvastatin 20 mg once a day and we will continue at this present dosing and that he has no symptomatology including muscle weakness or myalgias. - simvastatin (ZOCOR) 20 MG tablet; Take 1 tablet (20 mg total) by mouth every morning.  Dispense: 90 tablet; Refill: 0  3. Mild memory disturbance Chronic.  Controlled.  Stable.  Mild memory deficits which we are countering with Aricept 5 mg daily. - donepezil (ARICEPT) 5 MG tablet; Take 1 tablet (5 mg total) by mouth at bedtime.  Dispense: 90 tablet; Refill: 0  4. Restless leg Chronic.  Controlled.  Stable.  Currently is on gabapentin for restless leg 100 mg nightly and we will continue at current dosing. - gabapentin (NEURONTIN) 100 MG capsule; Take 1 capsule (100 mg  total) by mouth at bedtime.  Dispense: 30 capsule; Refill: 5  5. Prediabetes Prediabetes is controlled with dietary approach in the meantime we will continue valsartan to reduce risk of diabetic nephropathy. - valsartan (DIOVAN) 40 MG tablet; Take 1 tablet (40 mg total) by mouth daily.  Dispense: 90 tablet; Refill: 1  6. Chronic systolic heart failure (HCC) Status post recent admission as noted above for acute on chronic systolic congestive heart failure.  In addition to the above medications of valsartan and carvedilol spironolactone and furosemide patient has also been placed on Farxiga 5 mg once a day.  In the meantime we have placed a referral to cardiology for formal evaluation and follow-up of most recently diagnosed acute on chronic systolic congestive heart failure. - valsartan (DIOVAN) 40 MG tablet; Take 1 tablet (40 mg total) by mouth daily.  Dispense: 90 tablet; Refill: 1 - carvedilol (COREG) 3.125 MG tablet; Take 1 tablet (3.125 mg total) by mouth 2 (two) times daily with a meal.  Dispense: 60 tablet; Refill: 1 - dapagliflozin propanediol (FARXIGA) 5 MG TABS tablet; Take 1 tablet (5 mg total) by mouth daily.  Dispense: 30 tablet; Refill: 1 - furosemide (LASIX) 20 MG tablet; Take 1 tablet (20 mg total) by mouth daily.  Dispense: 30 tablet; Refill: 1 - spironolactone (ALDACTONE) 25 MG tablet; Take 0.5 tablets (12.5 mg total) by mouth daily.  Dispense: 15 tablet; Refill: 1 - Ambulatory referral to Cardiology    Elizabeth Sauer, MD

## 2023-05-18 DIAGNOSIS — E1140 Type 2 diabetes mellitus with diabetic neuropathy, unspecified: Secondary | ICD-10-CM | POA: Diagnosis not present

## 2023-05-18 DIAGNOSIS — I5021 Acute systolic (congestive) heart failure: Secondary | ICD-10-CM | POA: Diagnosis not present

## 2023-05-18 DIAGNOSIS — M1990 Unspecified osteoarthritis, unspecified site: Secondary | ICD-10-CM | POA: Diagnosis not present

## 2023-05-18 DIAGNOSIS — E785 Hyperlipidemia, unspecified: Secondary | ICD-10-CM | POA: Diagnosis not present

## 2023-05-18 DIAGNOSIS — E1151 Type 2 diabetes mellitus with diabetic peripheral angiopathy without gangrene: Secondary | ICD-10-CM | POA: Diagnosis not present

## 2023-05-18 DIAGNOSIS — I110 Hypertensive heart disease with heart failure: Secondary | ICD-10-CM | POA: Diagnosis not present

## 2023-05-20 DIAGNOSIS — I110 Hypertensive heart disease with heart failure: Secondary | ICD-10-CM | POA: Diagnosis not present

## 2023-05-20 DIAGNOSIS — E1140 Type 2 diabetes mellitus with diabetic neuropathy, unspecified: Secondary | ICD-10-CM | POA: Diagnosis not present

## 2023-05-20 DIAGNOSIS — E785 Hyperlipidemia, unspecified: Secondary | ICD-10-CM | POA: Diagnosis not present

## 2023-05-20 DIAGNOSIS — E1151 Type 2 diabetes mellitus with diabetic peripheral angiopathy without gangrene: Secondary | ICD-10-CM | POA: Diagnosis not present

## 2023-05-20 DIAGNOSIS — I5021 Acute systolic (congestive) heart failure: Secondary | ICD-10-CM | POA: Diagnosis not present

## 2023-05-20 DIAGNOSIS — M1990 Unspecified osteoarthritis, unspecified site: Secondary | ICD-10-CM | POA: Diagnosis not present

## 2023-05-25 DIAGNOSIS — I110 Hypertensive heart disease with heart failure: Secondary | ICD-10-CM | POA: Diagnosis not present

## 2023-05-25 DIAGNOSIS — E1140 Type 2 diabetes mellitus with diabetic neuropathy, unspecified: Secondary | ICD-10-CM | POA: Diagnosis not present

## 2023-05-25 DIAGNOSIS — E1151 Type 2 diabetes mellitus with diabetic peripheral angiopathy without gangrene: Secondary | ICD-10-CM | POA: Diagnosis not present

## 2023-05-25 DIAGNOSIS — M1990 Unspecified osteoarthritis, unspecified site: Secondary | ICD-10-CM | POA: Diagnosis not present

## 2023-05-25 DIAGNOSIS — E785 Hyperlipidemia, unspecified: Secondary | ICD-10-CM | POA: Diagnosis not present

## 2023-05-25 DIAGNOSIS — I5021 Acute systolic (congestive) heart failure: Secondary | ICD-10-CM | POA: Diagnosis not present

## 2023-06-01 DIAGNOSIS — I110 Hypertensive heart disease with heart failure: Secondary | ICD-10-CM | POA: Diagnosis not present

## 2023-06-01 DIAGNOSIS — E1151 Type 2 diabetes mellitus with diabetic peripheral angiopathy without gangrene: Secondary | ICD-10-CM | POA: Diagnosis not present

## 2023-06-01 DIAGNOSIS — M1990 Unspecified osteoarthritis, unspecified site: Secondary | ICD-10-CM | POA: Diagnosis not present

## 2023-06-01 DIAGNOSIS — I5021 Acute systolic (congestive) heart failure: Secondary | ICD-10-CM | POA: Diagnosis not present

## 2023-06-01 DIAGNOSIS — E1140 Type 2 diabetes mellitus with diabetic neuropathy, unspecified: Secondary | ICD-10-CM | POA: Diagnosis not present

## 2023-06-01 DIAGNOSIS — E785 Hyperlipidemia, unspecified: Secondary | ICD-10-CM | POA: Diagnosis not present

## 2023-06-03 ENCOUNTER — Other Ambulatory Visit: Payer: Self-pay | Admitting: Family Medicine

## 2023-06-03 DIAGNOSIS — M1990 Unspecified osteoarthritis, unspecified site: Secondary | ICD-10-CM | POA: Diagnosis not present

## 2023-06-03 DIAGNOSIS — E785 Hyperlipidemia, unspecified: Secondary | ICD-10-CM | POA: Diagnosis not present

## 2023-06-03 DIAGNOSIS — E1151 Type 2 diabetes mellitus with diabetic peripheral angiopathy without gangrene: Secondary | ICD-10-CM | POA: Diagnosis not present

## 2023-06-03 DIAGNOSIS — I5021 Acute systolic (congestive) heart failure: Secondary | ICD-10-CM | POA: Diagnosis not present

## 2023-06-03 DIAGNOSIS — I110 Hypertensive heart disease with heart failure: Secondary | ICD-10-CM | POA: Diagnosis not present

## 2023-06-03 DIAGNOSIS — E1140 Type 2 diabetes mellitus with diabetic neuropathy, unspecified: Secondary | ICD-10-CM | POA: Diagnosis not present

## 2023-06-03 DIAGNOSIS — E782 Mixed hyperlipidemia: Secondary | ICD-10-CM

## 2023-06-08 DIAGNOSIS — I110 Hypertensive heart disease with heart failure: Secondary | ICD-10-CM | POA: Diagnosis not present

## 2023-06-08 DIAGNOSIS — E785 Hyperlipidemia, unspecified: Secondary | ICD-10-CM | POA: Diagnosis not present

## 2023-06-08 DIAGNOSIS — E1151 Type 2 diabetes mellitus with diabetic peripheral angiopathy without gangrene: Secondary | ICD-10-CM | POA: Diagnosis not present

## 2023-06-08 DIAGNOSIS — E1140 Type 2 diabetes mellitus with diabetic neuropathy, unspecified: Secondary | ICD-10-CM | POA: Diagnosis not present

## 2023-06-08 DIAGNOSIS — M1990 Unspecified osteoarthritis, unspecified site: Secondary | ICD-10-CM | POA: Diagnosis not present

## 2023-06-08 DIAGNOSIS — I5021 Acute systolic (congestive) heart failure: Secondary | ICD-10-CM | POA: Diagnosis not present

## 2023-06-15 DIAGNOSIS — E1151 Type 2 diabetes mellitus with diabetic peripheral angiopathy without gangrene: Secondary | ICD-10-CM | POA: Diagnosis not present

## 2023-06-15 DIAGNOSIS — M1990 Unspecified osteoarthritis, unspecified site: Secondary | ICD-10-CM | POA: Diagnosis not present

## 2023-06-15 DIAGNOSIS — E785 Hyperlipidemia, unspecified: Secondary | ICD-10-CM | POA: Diagnosis not present

## 2023-06-15 DIAGNOSIS — E1140 Type 2 diabetes mellitus with diabetic neuropathy, unspecified: Secondary | ICD-10-CM | POA: Diagnosis not present

## 2023-06-15 DIAGNOSIS — I5021 Acute systolic (congestive) heart failure: Secondary | ICD-10-CM | POA: Diagnosis not present

## 2023-06-15 DIAGNOSIS — I110 Hypertensive heart disease with heart failure: Secondary | ICD-10-CM | POA: Diagnosis not present

## 2023-06-22 DIAGNOSIS — E1151 Type 2 diabetes mellitus with diabetic peripheral angiopathy without gangrene: Secondary | ICD-10-CM | POA: Diagnosis not present

## 2023-06-22 DIAGNOSIS — I5021 Acute systolic (congestive) heart failure: Secondary | ICD-10-CM | POA: Diagnosis not present

## 2023-06-22 DIAGNOSIS — E1140 Type 2 diabetes mellitus with diabetic neuropathy, unspecified: Secondary | ICD-10-CM | POA: Diagnosis not present

## 2023-06-22 DIAGNOSIS — I110 Hypertensive heart disease with heart failure: Secondary | ICD-10-CM | POA: Diagnosis not present

## 2023-06-22 DIAGNOSIS — M1990 Unspecified osteoarthritis, unspecified site: Secondary | ICD-10-CM | POA: Diagnosis not present

## 2023-06-22 DIAGNOSIS — E785 Hyperlipidemia, unspecified: Secondary | ICD-10-CM | POA: Diagnosis not present

## 2023-06-26 DIAGNOSIS — E785 Hyperlipidemia, unspecified: Secondary | ICD-10-CM | POA: Diagnosis not present

## 2023-06-26 DIAGNOSIS — I110 Hypertensive heart disease with heart failure: Secondary | ICD-10-CM | POA: Diagnosis not present

## 2023-06-26 DIAGNOSIS — E1151 Type 2 diabetes mellitus with diabetic peripheral angiopathy without gangrene: Secondary | ICD-10-CM | POA: Diagnosis not present

## 2023-06-26 DIAGNOSIS — M1990 Unspecified osteoarthritis, unspecified site: Secondary | ICD-10-CM | POA: Diagnosis not present

## 2023-06-26 DIAGNOSIS — I5021 Acute systolic (congestive) heart failure: Secondary | ICD-10-CM | POA: Diagnosis not present

## 2023-06-26 DIAGNOSIS — E1140 Type 2 diabetes mellitus with diabetic neuropathy, unspecified: Secondary | ICD-10-CM | POA: Diagnosis not present

## 2023-06-30 DIAGNOSIS — I110 Hypertensive heart disease with heart failure: Secondary | ICD-10-CM | POA: Diagnosis not present

## 2023-06-30 DIAGNOSIS — Z23 Encounter for immunization: Secondary | ICD-10-CM | POA: Diagnosis not present

## 2023-06-30 DIAGNOSIS — I5021 Acute systolic (congestive) heart failure: Secondary | ICD-10-CM | POA: Diagnosis not present

## 2023-06-30 DIAGNOSIS — Z0000 Encounter for general adult medical examination without abnormal findings: Secondary | ICD-10-CM | POA: Diagnosis not present

## 2023-06-30 DIAGNOSIS — E119 Type 2 diabetes mellitus without complications: Secondary | ICD-10-CM | POA: Diagnosis not present

## 2023-06-30 DIAGNOSIS — E785 Hyperlipidemia, unspecified: Secondary | ICD-10-CM | POA: Diagnosis not present

## 2023-08-02 DIAGNOSIS — H6123 Impacted cerumen, bilateral: Secondary | ICD-10-CM | POA: Diagnosis not present

## 2023-08-02 DIAGNOSIS — H903 Sensorineural hearing loss, bilateral: Secondary | ICD-10-CM | POA: Diagnosis not present

## 2023-08-09 DIAGNOSIS — F5101 Primary insomnia: Secondary | ICD-10-CM | POA: Diagnosis not present

## 2023-08-09 DIAGNOSIS — I5021 Acute systolic (congestive) heart failure: Secondary | ICD-10-CM | POA: Diagnosis not present

## 2023-08-11 ENCOUNTER — Ambulatory Visit: Payer: 59 | Admitting: Urology

## 2023-08-14 ENCOUNTER — Ambulatory Visit: Payer: 59 | Admitting: Urology

## 2023-08-15 ENCOUNTER — Other Ambulatory Visit: Payer: Self-pay | Admitting: Family Medicine

## 2023-08-15 DIAGNOSIS — I10 Essential (primary) hypertension: Secondary | ICD-10-CM

## 2023-08-15 DIAGNOSIS — I5022 Chronic systolic (congestive) heart failure: Secondary | ICD-10-CM

## 2023-08-16 ENCOUNTER — Ambulatory Visit: Payer: 59 | Admitting: Urology

## 2023-08-16 NOTE — Telephone Encounter (Signed)
 Last OV 05/16/23, within protocol.  Requested Prescriptions  Pending Prescriptions Disp Refills   carvedilol  (COREG ) 3.125 MG tablet [Pharmacy Med Name: CARVEDILOL  3.125 MG TABLET] 60 tablet 0    Sig: Take 1 tablet (3.125 mg total) by mouth 2 (two) times daily with a meal.     Cardiovascular: Beta Blockers 3 Failed - 08/16/2023  8:53 AM      Failed - AST in normal range and within 360 days    AST  Date Value Ref Range Status  01/23/2023 13 (L) 15 - 41 U/L Final         Failed - Valid encounter within last 6 months    Recent Outpatient Visits   None     Future Appointments             In 5 days Stoioff, Kizzie Perks, MD [facility] [place]            Passed - Cr in normal range and within 360 days    Creatinine  Date Value Ref Range Status  01/23/2023 1.19 0.61 - 1.24 mg/dL Final   Creatinine, Ser  Date Value Ref Range Status  04/28/2023 1.23 0.61 - 1.24 mg/dL Final         Passed - ALT in normal range and within 360 days    ALT  Date Value Ref Range Status  01/23/2023 9 0 - 44 U/L Final         Passed - Last BP in normal range    BP Readings from Last 1 Encounters:  05/16/23 132/64         Passed - Last Heart Rate in normal range    Pulse Readings from Last 1 Encounters:  05/16/23 66          FARXIGA  5 MG TABS tablet [Pharmacy Med Name: FARXIGA  5 MG TABLET] 30 tablet 0    Sig: Take 1 tablet (5 mg total) by mouth daily.     Endocrinology:  Diabetes - SGLT2 Inhibitors Failed - 08/16/2023  8:53 AM      Failed - eGFR in normal range and within 360 days    GFR calc Af Amer  Date Value Ref Range Status  01/21/2020 55 (L) >60 mL/min Final   GFR, Estimated  Date Value Ref Range Status  04/28/2023 55 (L) >60 mL/min Final    Comment:    (NOTE) Calculated using the CKD-EPI Creatinine Equation (2021)   01/23/2023 57 (L) >60 mL/min Final    Comment:    (NOTE) Calculated using the CKD-EPI Creatinine Equation (2021)    eGFR  Date Value Ref Range  Status  04/12/2021 44 (L) >59 mL/min/1.73 Final         Failed - Valid encounter within last 6 months    Recent Outpatient Visits   None     Future Appointments             In 5 days Stoioff, Kizzie Perks, MD [facility] [place]            Passed - Cr in normal range and within 360 days    Creatinine  Date Value Ref Range Status  01/23/2023 1.19 0.61 - 1.24 mg/dL Final   Creatinine, Ser  Date Value Ref Range Status  04/28/2023 1.23 0.61 - 1.24 mg/dL Final         Passed - HBA1C is between 0 and 7.9 and within 180 days    Hgb A1c MFr Bld  Date  Value Ref Range Status  04/26/2023 5.5 4.8 - 5.6 % Final    Comment:    (NOTE)         Prediabetes: 5.7 - 6.4         Diabetes: >6.4         Glycemic control for adults with diabetes: <7.0           furosemide  (LASIX ) 20 MG tablet [Pharmacy Med Name: FUROSEMIDE  20 MG TABLET] 30 tablet 0    Sig: Take 1 tablet (20 mg total) by mouth daily.     Cardiovascular:  Diuretics - Loop Failed - 08/16/2023  8:53 AM      Failed - Ca in normal range and within 180 days    Calcium  Date Value Ref Range Status  04/28/2023 8.5 (L) 8.9 - 10.3 mg/dL Final         Failed - Valid encounter within last 6 months    Recent Outpatient Visits   None     Future Appointments             In 5 days Stoioff, Kizzie Perks, MD [facility] [place]            Passed - K in normal range and within 180 days    Potassium  Date Value Ref Range Status  04/28/2023 3.7 3.5 - 5.1 mmol/L Final         Passed - Na in normal range and within 180 days    Sodium  Date Value Ref Range Status  04/28/2023 137 135 - 145 mmol/L Final  04/12/2021 140 134 - 144 mmol/L Final         Passed - Cr in normal range and within 180 days    Creatinine  Date Value Ref Range Status  01/23/2023 1.19 0.61 - 1.24 mg/dL Final   Creatinine, Ser  Date Value Ref Range Status  04/28/2023 1.23 0.61 - 1.24 mg/dL Final         Passed - Cl in normal range  and within 180 days    Chloride  Date Value Ref Range Status  04/28/2023 101 98 - 111 mmol/L Final         Passed - Mg Level in normal range and within 180 days    Magnesium   Date Value Ref Range Status  04/28/2023 2.3 1.7 - 2.4 mg/dL Final    Comment:    Performed at [facility], [address] [place], [place] [address]         Passed - Last BP in normal range    BP Readings from Last 1 Encounters:  05/16/23 132/64          spironolactone  (ALDACTONE ) 25 MG tablet [Pharmacy Med Name: SPIRONOLACTONE  25 MG TABLET] 15 tablet 0    Sig: Take 0.5 tablets (12.5 mg total) by mouth daily.     Cardiovascular: Diuretics - Aldosterone Antagonist Failed - 08/16/2023  8:53 AM      Failed - Valid encounter within last 6 months    Recent Outpatient Visits   None     Future Appointments             In 5 days Stoioff, Kizzie Perks, MD [facility] [place]            Passed - Cr in normal range and within 180 days    Creatinine  Date Value Ref Range Status  01/23/2023 1.19 0.61 - 1.24 mg/dL Final   Creatinine,  Ser  Date Value Ref Range Status  04/28/2023 1.23 0.61 - 1.24 mg/dL Final         Passed - K in normal range and within 180 days    Potassium  Date Value Ref Range Status  04/28/2023 3.7 3.5 - 5.1 mmol/L Final         Passed - Na in normal range and within 180 days    Sodium  Date Value Ref Range Status  04/28/2023 137 135 - 145 mmol/L Final  04/12/2021 140 134 - 144 mmol/L Final         Passed - eGFR is 30 or above and within 180 days    GFR calc Af Amer  Date Value Ref Range Status  01/21/2020 55 (L) >60 mL/min Final   GFR, Estimated  Date Value Ref Range Status  04/28/2023 55 (L) >60 mL/min Final    Comment:    (NOTE) Calculated using the CKD-EPI Creatinine Equation (2021)   01/23/2023 57 (L) >60 mL/min Final    Comment:    (NOTE) Calculated using the CKD-EPI Creatinine Equation (2021)    eGFR  Date Value Ref Range Status   04/12/2021 44 (L) >59 mL/min/1.73 Final         Passed - Last BP in normal range    BP Readings from Last 1 Encounters:  05/16/23 132/64

## 2023-08-21 ENCOUNTER — Ambulatory Visit: Payer: 59 | Admitting: Urology

## 2023-08-21 ENCOUNTER — Telehealth: Payer: Self-pay | Admitting: Physician Assistant

## 2023-08-21 NOTE — Telephone Encounter (Signed)
 Pt's information was added to the prior auth excel sheet. Appt was rescheduled to 08/31/2023

## 2023-08-21 NOTE — Telephone Encounter (Signed)
 I r/s pt's appt for Eliguard because there was no PA.  I wasn't sure who does this.  He was on Stoioff's schedule, but Camilo Cella told me to add him to [facility] because she had seen him and treated him before.

## 2023-08-23 ENCOUNTER — Inpatient Hospital Stay: Payer: 59 | Admitting: Oncology

## 2023-08-23 ENCOUNTER — Inpatient Hospital Stay: Payer: 59 | Attending: Oncology

## 2023-08-23 ENCOUNTER — Encounter: Payer: Self-pay | Admitting: Oncology

## 2023-08-23 DIAGNOSIS — Z79818 Long term (current) use of other agents affecting estrogen receptors and estrogen levels: Secondary | ICD-10-CM

## 2023-08-23 DIAGNOSIS — C7951 Secondary malignant neoplasm of bone: Secondary | ICD-10-CM | POA: Insufficient documentation

## 2023-08-23 DIAGNOSIS — Z79890 Hormone replacement therapy (postmenopausal): Secondary | ICD-10-CM

## 2023-08-23 DIAGNOSIS — C61 Malignant neoplasm of prostate: Secondary | ICD-10-CM

## 2023-08-23 LAB — CBC WITH DIFFERENTIAL/PLATELET
Abs Immature Granulocytes: 0.02 10*3/uL (ref 0.00–0.07)
Basophils Absolute: 0.1 10*3/uL (ref 0.0–0.1)
Basophils Relative: 2 %
Eosinophils Absolute: 0.2 10*3/uL (ref 0.0–0.5)
Eosinophils Relative: 4 %
HCT: 37.4 % — ABNORMAL LOW (ref 39.0–52.0)
Hemoglobin: 11.7 g/dL — ABNORMAL LOW (ref 13.0–17.0)
Immature Granulocytes: 0 %
Lymphocytes Relative: 39 %
Lymphs Abs: 1.9 10*3/uL (ref 0.7–4.0)
MCH: 30.2 pg (ref 26.0–34.0)
MCHC: 31.3 g/dL (ref 30.0–36.0)
MCV: 96.4 fL (ref 80.0–100.0)
Monocytes Absolute: 0.5 10*3/uL (ref 0.1–1.0)
Monocytes Relative: 10 %
Neutro Abs: 2.2 10*3/uL (ref 1.7–7.7)
Neutrophils Relative %: 45 %
Platelets: 284 10*3/uL (ref 150–400)
RBC: 3.88 MIL/uL — ABNORMAL LOW (ref 4.22–5.81)
RDW: 12.9 % (ref 11.5–15.5)
WBC: 4.9 10*3/uL (ref 4.0–10.5)
nRBC: 0 % (ref 0.0–0.2)

## 2023-08-23 LAB — COMPREHENSIVE METABOLIC PANEL WITH GFR
ALT: 10 U/L (ref 0–44)
AST: 16 U/L (ref 15–41)
Albumin: 4 g/dL (ref 3.5–5.0)
Alkaline Phosphatase: 91 U/L (ref 38–126)
Anion gap: 9 (ref 5–15)
BUN: 23 mg/dL (ref 8–23)
CO2: 24 mmol/L (ref 22–32)
Calcium: 8.8 mg/dL — ABNORMAL LOW (ref 8.9–10.3)
Chloride: 103 mmol/L (ref 98–111)
Creatinine, Ser: 1.46 mg/dL — ABNORMAL HIGH (ref 0.61–1.24)
GFR, Estimated: 45 mL/min — ABNORMAL LOW (ref >60)
Glucose, Bld: 123 mg/dL — ABNORMAL HIGH (ref 70–99)
Potassium: 4.2 mmol/L (ref 3.5–5.1)
Sodium: 136 mmol/L (ref 135–145)
Total Bilirubin: 0.9 mg/dL (ref 0.0–1.2)
Total Protein: 7.2 g/dL (ref 6.5–8.1)

## 2023-08-23 LAB — PSA: Prostatic Specific Antigen: 4.1 ng/mL — ABNORMAL HIGH (ref 0.00–4.00)

## 2023-08-23 NOTE — Telephone Encounter (Signed)
 Your Authorization Request Has Been Approved  Your authorization request number is [id number]. If you need to add a new chemotherapy drug, supportive care drug, or a new chemotherapy regimen, you will need to submit a new authorization request.  Authorization Status Approved Authorization Number [id number] Authorization Start Date 08-23-2023 Authorization End Date 08-22-2024

## 2023-08-23 NOTE — Progress Notes (Signed)
 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  At that time he had staging evaluation consistent with metastatic disease to the sacrum and pelvis and was started on Lupron  every 6 months.  Bone scan done on March 2019 again showed metastatic involvement of the right acetabulum and iliac wing and right sacrum.  Patient has been responding to Lupron  well and his PSA went down from 6.6 in 20 16-0.5 in 2019.  It went up to 1.2 on 08/13/2019.  Patient has therefore been referred to us  for consideration of additional treatments for prostate cancer patient is doing well for his age and remains independent of his ADLs.    He gets Lupron  through urology.  He has not required any additional antiandrogen treatment so far  Interval history-patients wife recently died about a week ago.  Patient lives alone and remains independent of his ADLs.  He is here with his son today who lives in [place]  and oversees his care now and then.  Patient reports occasional hot flashes but is otherwise doing well and tolerating ADT well so far  ECOG PS- 2 Pain scale- 0   Review of systems- Review of Systems  Constitutional:  Positive for malaise/fatigue. Negative for chills, fever and weight loss.   HENT:  Negative for congestion, ear discharge and nosebleeds.   Eyes:  Negative for blurred vision.  Respiratory:  Negative for cough, hemoptysis, sputum production, shortness of breath and wheezing.   Cardiovascular:  Negative for chest pain, palpitations, orthopnea and claudication.  Gastrointestinal:  Negative for abdominal pain, blood in stool, constipation, diarrhea, heartburn, melena, nausea and vomiting.  Genitourinary:  Negative for dysuria, flank pain, frequency, hematuria and urgency.  Musculoskeletal:  Negative for back pain, joint pain and myalgias.  Skin:  Negative for rash.  Neurological:  Negative for dizziness, tingling, focal weakness, seizures, weakness and headaches.  Endo/Heme/Allergies:  Does not bruise/bleed easily.  Psychiatric/Behavioral:  Negative for depression and suicidal ideas. The patient does not have insomnia.       Allergies  Allergen Reactions   Lisinopril      coughing  Other Reaction(s): Unknown  coughing coughing    coughing     Past Medical History:  Diagnosis Date   Diabetes mellitus without complication (HCC)    Hearing loss    Hx of fracture of wrist 07/20/2017   >30 years ago. <1989   Hyperlipidemia    Hypertension    Prostate cancer [facility])      Past Surgical History:  Procedure Laterality Date   CATARACT EXTRACTION W/PHACO Right 09/27/2017   Procedure: CATARACT EXTRACTION PHACO AND INTRAOCULAR LENS PLACEMENT (IOC) RIGHT;  Surgeon: Annell Kidney, MD;  Location: [facility];  Service: Ophthalmology;  Laterality: Right;  IVA TOPICAL DIABETES   CATARACT EXTRACTION W/PHACO Left 10/18/2017   Procedure: CATARACT EXTRACTION PHACO AND INTRAOCULAR LENS PLACEMENT (  IOC)  LEFT DIABETIC;  Surgeon: Annell Kidney, MD;  Location: [facility];  Service: Ophthalmology;  Laterality: Left;  DIABETIC-ORAL MED   COLONOSCOPY  2011   normal- Dr?    Social History   Socioeconomic History   Marital status: Married     Spouse name: Not on file   Number of children: Not on file   Years of education: Not on file   Highest education level: Not on file  Occupational History   Not on file  Tobacco Use   Smoking status: Never   Smokeless tobacco: Never  Vaping Use   Vaping status: Never Used  Substance and Sexual Activity   Alcohol use: No    Alcohol/week: 0.0 standard drinks of alcohol   Drug use: No   Sexual activity: Yes  Other Topics Concern   Not on file  Social History Narrative   Pt lives with wife.    Social Drivers of Corporate investment banker Strain: Low Risk  (06/30/2023)   Received from [facility]   Overall Financial Resource Strain (CARDIA)    Difficulty of Paying Living Expenses: Not hard at all  Food Insecurity: No Food Insecurity (06/30/2023)   Received from [facility]   Hunger Vital Sign    Worried About Running Out of Food in the Last Year: Never true    Ran Out of Food in the Last Year: Never true  Transportation Needs: No Transportation Needs (06/30/2023)   Received from [facility] - Transportation    In the past 12 months, has lack of transportation kept you from medical appointments or from getting medications?: No    Lack of Transportation (Non-Medical): No  Physical Activity: Insufficiently Active (12/07/2022)   Exercise Vital Sign    Days of Exercise per Week: 3 days    Minutes of Exercise per Session: 30 min  Stress: No Stress Concern Present (12/07/2022)   [employer] of Occupational Health - Occupational Stress Questionnaire    Feeling of Stress : Not at all  Social Connections: Socially Integrated (05/01/2023)   Social Connection and Isolation Panel [NHANES]    Frequency of Communication with Friends and Family: More than three times a week    Frequency of Social Gatherings with Friends and Family: More than three times a week    Attends Religious Services: More than 4 times per year    Active  Member of [employer] or Organizations: Yes    Attends Engineer, structural: More than 4 times per year    Marital Status: Married  Catering manager Violence: Not At Risk (05/01/2023)   Humiliation, Afraid, Rape, and Kick questionnaire    Fear of Current or Ex-Partner: No    Emotionally Abused: No    Physically Abused: No    Sexually Abused: No    Family History  Family history unknown: Yes     Current Outpatient Medications:    aspirin  81 MG tablet, Take 81 mg by mouth daily., Disp: , Rfl:    carvedilol  (COREG ) 3.125 MG tablet, Take 1 tablet (3.125 mg total) by mouth 2 (two) times daily with a meal., Disp: 180 tablet, Rfl: 0   dapagliflozin  propanediol (FARXIGA ) 5 MG TABS tablet, Take 1 tablet (5 mg total) by mouth daily., Disp: 90 tablet, Rfl: 0   donepezil  (ARICEPT ) 5 MG tablet, Take 1 tablet (5 mg total) by mouth at bedtime., Disp: 90 tablet, Rfl: 0   furosemide  (LASIX )  40 MG tablet, Take 40 mg by mouth., Disp: , Rfl:    gabapentin  (NEURONTIN ) 100 MG capsule, Take 1 capsule (100 mg total) by mouth at bedtime., Disp: 30 capsule, Rfl: 5   GE100 BLOOD GLUCOSE TEST test strip, CHECK BLOOD SUGAR ONCE DAILY OR AS DIRECTED, Disp: 50 strip, Rfl: 0   Lancets (FREESTYLE) lancets, AS DIRECTED, Disp: 100 each, Rfl: 0   mirtazapine (REMERON) 15 MG tablet, Take 15 mg by mouth., Disp: , Rfl:    simvastatin  (ZOCOR ) 20 MG tablet, Take 1 tablet (20 mg total) by mouth every morning., Disp: 90 tablet, Rfl: 0   spironolactone  (ALDACTONE ) 25 MG tablet, Take 0.5 tablets (12.5 mg total) by mouth daily., Disp: 45 tablet, Rfl: 0   valsartan  (DIOVAN ) 40 MG tablet, Take 1 tablet (40 mg total) by mouth daily., Disp: 90 tablet, Rfl: 1   furosemide  (LASIX ) 20 MG tablet, Take 1 tablet (20 mg total) by mouth daily. (Patient not taking: Reported on 08/23/2023), Disp: 90 tablet, Rfl: 0  Physical exam:  Vitals:   08/23/23 1149  BP: (!) 121/54  Pulse: 71  Resp: 16  Temp: (!) 96.9 F (36.1 C)  TempSrc: Tympanic   SpO2: 100%  Weight: 209 lb (94.8 kg)   Physical Exam Cardiovascular:     Rate and Rhythm: Normal rate and regular rhythm.     Heart sounds: Normal heart sounds.  Pulmonary:     Effort: Pulmonary effort is normal.     Breath sounds: Normal breath sounds.  Abdominal:     General: Bowel sounds are normal.     Palpations: Abdomen is soft.  Skin:    General: Skin is warm and dry.  Neurological:     Mental Status: He is alert and oriented to person, place, and time.      I have personally reviewed labs listed below:    Latest Ref Rng & Units 08/23/2023   11:24 AM  CMP  Glucose 70 - 99 mg/dL 161   BUN 8 - 23 mg/dL 23   Creatinine 0.96 - 1.24 mg/dL 0.45   Sodium 409 - 811 mmol/L 136   Potassium 3.5 - 5.1 mmol/L 4.2   Chloride 98 - 111 mmol/L 103   CO2 22 - 32 mmol/L 24   Calcium 8.9 - 10.3 mg/dL 8.8   Total Protein 6.5 - 8.1 g/dL 7.2   Total Bilirubin 0.0 - 1.2 mg/dL 0.9   Alkaline Phos 38 - 126 U/L 91   AST 15 - 41 U/L 16   ALT 0 - 44 U/L 10       Latest Ref Rng & Units 08/23/2023   11:24 AM  CBC  WBC 4.0 - 10.5 K/uL 4.9   Hemoglobin 13.0 - 17.0 g/dL 91.4   Hematocrit 78.2 - 52.0 % 37.4   Platelets 150 - 400 K/uL 284      Assessment and plan- Patient is a 89 y.o. 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  Last PSA which was checked 7 months ago was elevated at 4.2 was compared to 3.5 before.  As per [facility] prostate doubling time calculator his PSA doubling time is about 19 months.  I am therefore holding off on starting any additional ADT at this time.  Last bone scan from April 2024 showed similar metastatic disease involving the right iliac bone and right sacrum which was found even back  in 2016.CBC with differential CMP and PSA in 3 months in 6 months and I will see him back in 6 months given that ADT can lead to worsening osteopenia I am  planning to get a bone density scan in the next 1 to 2 months   Visit Diagnosis 1. Primary prostate adenocarcinoma (HCC)   2. Encounter for monitoring Lupron  therapy      Dr. Seretha Dance, MD, MPH [facility] [phone] 08/23/2023 4:06 PM

## 2023-08-30 ENCOUNTER — Telehealth: Payer: Self-pay

## 2023-08-30 NOTE — Telephone Encounter (Signed)
 Eligard  Prior authorization is Approved.  [employer] [facility] ph #[phone]  Case # [id number]  Approval dates: 08/30/23 - 08/24/24

## 2023-08-31 ENCOUNTER — Ambulatory Visit: Admitting: Physician Assistant

## 2023-09-05 ENCOUNTER — Ambulatory Visit (INDEPENDENT_AMBULATORY_CARE_PROVIDER_SITE_OTHER): Admitting: Physician Assistant

## 2023-09-05 VITALS — BP 130/80 | HR 74 | Ht 72.0 in | Wt 209.0 lb

## 2023-09-05 DIAGNOSIS — C61 Malignant neoplasm of prostate: Secondary | ICD-10-CM

## 2023-09-05 MED ORDER — LEUPROLIDE ACETATE (6 MONTH) 45 MG ~~LOC~~ KIT
45.0000 mg | PACK | Freq: Once | SUBCUTANEOUS | Status: AC
Start: 2023-09-05 — End: 2023-09-05
  Administered 2023-09-05: 45 mg via SUBCUTANEOUS

## 2023-09-05 NOTE — Patient Instructions (Signed)
 Please take the following dietary supplements for the duration of your hormone suppression therapy to reduce your risk for bone loss: -Calcium 1000-1200mg  daily -Vitamin D 800-1000IU daily

## 2023-09-05 NOTE — Progress Notes (Addendum)
 Eligard  SubQ Injection   Due to Prostate Cancer patient is present today for a Eligard  Injection.  Medication: Eligard  6 month Dose: 45 mg  Location: right  Lot: [id number] Exp: 09/2024  Patient tolerated well, no complications were noted  Performed by: Theodosia Fishman CMA   Per Sam patient is to continue therapy for 11 /10/2023. Lifetime  This appointment was scheduled using wheel and given to patient today along with reminder continue on Vitamin D 800-1000iu and Calcium 1000-1200mg  daily while on Androgen Deprivation Therapy.  PA approval dates: 08/30/23 - 08/24/24   He presents today with his granddaughter. Sadly, he lost his wife of 63 years 2 weeks ago. He feels well on ADT and notes only occasional hot flashes. Unclear if he is taking calcium and vitamin D supplements; dosing added to AVS today to resume if not already taking. PSA improved somewhat, great news. Will see him back in 6 months. Samantha Vaillancourt, PA-C

## 2023-09-11 DIAGNOSIS — I5020 Unspecified systolic (congestive) heart failure: Secondary | ICD-10-CM | POA: Diagnosis not present

## 2023-09-19 DIAGNOSIS — I10 Essential (primary) hypertension: Secondary | ICD-10-CM | POA: Diagnosis not present

## 2023-09-19 DIAGNOSIS — I5020 Unspecified systolic (congestive) heart failure: Secondary | ICD-10-CM | POA: Diagnosis not present

## 2023-09-19 NOTE — Progress Notes (Signed)
 New Patient Visit    Chief Complaint: Heart failure with reduced EF Chief Complaint  Patient presents with  . Establish Care  . Congestive Heart Failure   Date of Service: 09/19/2023 Date of Birth: 26-Sep-1927 PCP: Salli Doretta Large, MD  History of Present Illness: [name] is a 89 y.o.male patient who presented to our clinic for management of heart failure with reduced EF.  Past medical history significant for heart failure with reduced EF, hypertension, hyperlipidemia, peripheral vascular disease.  He was in the hospital in 04/2023 for decompensated heart failure with reduced EF, echo at that time showed reduced LVEF of 25 to 30%, normal RV systolic function, mild to moderate mitral regurgitation.  Patient is accompanied by home aide to visit today.  His son was over the phone during evaluation.  Patient does basic household activities like bathing, sometimes need help with dressing.  Sometimes uses cane to walk within the house.  Not very active.  At baseline NYHA class II exertional dyspnea.  No complaints of increased dyspnea or orthopnea.  No chest pain/pressure.  No palpitation, dizziness.  Past Medical and Surgical History  Past Medical History No past medical history on file.  Past Surgical History He has a past surgical history that includes Cataract extraction (Bilateral).   Medications and Allergies  Current Medications Current Outpatient Medications  Medication Sig Dispense Refill  . aspirin  81 MG EC tablet Take 81 mg by mouth    . carvediloL  (COREG ) 3.125 MG tablet Take 3.125 mg by mouth once daily    . cholecalciferol (VITAMIN D3) 400 unit tablet Take 400 Units by mouth once daily    . donepeziL  (ARICEPT ) 5 MG tablet Take 5 mg by mouth at bedtime    . FUROsemide  (LASIX ) 40 MG tablet Take 1 tablet (40 mg total) by mouth once daily 90 tablet 3  . gabapentin  (NEURONTIN ) 100 MG capsule Take 100 mg by mouth at bedtime    . mirtazapine (REMERON) 15 MG tablet Take 1  tablet (15 mg total) by mouth at bedtime for 90 days 90 tablet 0  . simvastatin  (ZOCOR ) 20 MG tablet Take 20 mg by mouth every morning    . spironolactone  (ALDACTONE ) 25 MG tablet Take 12.5 mg by mouth once daily    . valsartan  (DIOVAN ) 40 MG tablet Take 40 mg by mouth once daily    . dapagliflozin  propanediol (FARXIGA ) 10 mg tablet Take 1 tablet (10 mg total) by mouth once daily 30 tablet 11   No current facility-administered medications for this visit.    Allergies Lisinopril   Social and Family History  Social History  reports that he has never smoked. He has never used smokeless tobacco. He reports that he does not drink alcohol and does not use drugs.  Family History family history is not on file.   Review of Systems   Review of Systems: The patient denies chest pain, shortness of breath, orthopnea, paroxysmal nocturnal dyspnea, pedal edema, palpitations, heart racing, presyncope, syncope.    Physical Examination   Vitals:BP 130/68   Pulse 59   Resp 16   Ht 188 cm (6' 2)   Wt 93 kg (205 lb)   SpO2 96%   BMI 26.32 kg/m  Ht:188 cm (6' 2) Wt:93 kg (205 lb) ADJ:Anib surface area is 2.2 meters squared. Body mass index is 26.32 kg/m.  HEENT: Pupils equally reactive to light and accomodation  Neck: Supple, no significant JVD Lungs: clear to auscultation bilaterally; no wheezes, rales, rhonchi Heart:  Regular rate and rhythm. No murmur Extremities: Trace pedal edema  Assessment and Plan   89 y.o. male with  Heart failure with reduced EF, LVEF 25 to 30% 04/2023 Hypertension Hyperlipidemia Peripheral arterial disease Elderly with limited functional status  Stable from cardiac standpoint with baseline NYHA class II exertional dyspnea.  Euvolemic on exam. Discussed management in detail with patient/son and would continue medical therapy Continue p.o. Lasix . Continue goal-directed medical therapy with valsartan , spironolactone , Coreg .  Will start dapagliflozin  10 mg  daily. Continue low-dose aspirin , statin  No orders of the defined types were placed in this encounter.   Return in about 3 months (around 12/20/2023).  KRISHNA CHAITANYA ALLURI, MD  This dictation was prepared with dragon dictation. Any transcription errors that result from this process are unintentional.

## 2023-10-05 ENCOUNTER — Ambulatory Visit
Admission: RE | Admit: 2023-10-05 | Discharge: 2023-10-05 | Disposition: A | Discharge status: Home / Self Care | Source: Ambulatory Visit | Attending: Oncology | Admitting: Oncology

## 2023-10-05 ENCOUNTER — Ambulatory Visit: Payer: Self-pay | Admitting: Oncology

## 2023-10-05 DIAGNOSIS — Z5181 Encounter for therapeutic drug level monitoring: Secondary | ICD-10-CM | POA: Insufficient documentation

## 2023-10-05 DIAGNOSIS — C61 Malignant neoplasm of prostate: Secondary | ICD-10-CM | POA: Diagnosis present

## 2023-10-05 DIAGNOSIS — Z79818 Long term (current) use of other agents affecting estrogen receptors and estrogen levels: Secondary | ICD-10-CM | POA: Diagnosis present

## 2023-10-05 DIAGNOSIS — M810 Age-related osteoporosis without current pathological fracture: Secondary | ICD-10-CM | POA: Diagnosis not present

## 2023-10-19 DIAGNOSIS — Z0271 Encounter for disability determination: Secondary | ICD-10-CM | POA: Diagnosis not present

## 2023-10-26 ENCOUNTER — Ambulatory Visit: Admitting: Oncology

## 2023-10-31 ENCOUNTER — Encounter: Payer: Self-pay | Admitting: Oncology

## 2023-10-31 ENCOUNTER — Inpatient Hospital Stay: Attending: Oncology | Admitting: Oncology

## 2023-10-31 ENCOUNTER — Inpatient Hospital Stay

## 2023-10-31 VITALS — BP 118/54 | HR 58 | Temp 98.6°F | Resp 20 | Wt 198.5 lb

## 2023-10-31 DIAGNOSIS — Z8546 Personal history of malignant neoplasm of prostate: Secondary | ICD-10-CM

## 2023-10-31 DIAGNOSIS — M810 Age-related osteoporosis without current pathological fracture: Secondary | ICD-10-CM

## 2023-10-31 DIAGNOSIS — C7951 Secondary malignant neoplasm of bone: Secondary | ICD-10-CM | POA: Insufficient documentation

## 2023-10-31 DIAGNOSIS — Z08 Encounter for follow-up examination after completed treatment for malignant neoplasm: Secondary | ICD-10-CM

## 2023-10-31 DIAGNOSIS — C61 Malignant neoplasm of prostate: Secondary | ICD-10-CM | POA: Diagnosis present

## 2023-10-31 LAB — CMP (CANCER CENTER ONLY)
ALT: 9 U/L (ref 0–44)
AST: 11 U/L — ABNORMAL LOW (ref 15–41)
Albumin: 3.7 g/dL (ref 3.5–5.0)
Alkaline Phosphatase: 83 U/L (ref 38–126)
Anion gap: 7 (ref 5–15)
BUN: 28 mg/dL — ABNORMAL HIGH (ref 8–23)
CO2: 26 mmol/L (ref 22–32)
Calcium: 9.1 mg/dL (ref 8.9–10.3)
Chloride: 106 mmol/L (ref 98–111)
Creatinine: 1.7 mg/dL — ABNORMAL HIGH (ref 0.61–1.24)
GFR, Estimated: 37 mL/min — ABNORMAL LOW (ref >60)
Glucose, Bld: 120 mg/dL — ABNORMAL HIGH (ref 70–99)
Potassium: 4.5 mmol/L (ref 3.5–5.1)
Sodium: 139 mmol/L (ref 135–145)
Total Bilirubin: 0.8 mg/dL (ref 0.0–1.2)
Total Protein: 7.3 g/dL (ref 6.5–8.1)

## 2023-10-31 LAB — CBC WITH DIFFERENTIAL (CANCER CENTER ONLY)
Abs Immature Granulocytes: 0.14 K/uL — ABNORMAL HIGH (ref 0.00–0.07)
Basophils Absolute: 0.1 K/uL (ref 0.0–0.1)
Basophils Relative: 1 %
Eosinophils Absolute: 0.3 K/uL (ref 0.0–0.5)
Eosinophils Relative: 5 %
HCT: 33.4 % — ABNORMAL LOW (ref 39.0–52.0)
Hemoglobin: 10.5 g/dL — ABNORMAL LOW (ref 13.0–17.0)
Immature Granulocytes: 2 %
Lymphocytes Relative: 34 %
Lymphs Abs: 2.4 K/uL (ref 0.7–4.0)
MCH: 29.8 pg (ref 26.0–34.0)
MCHC: 31.4 g/dL (ref 30.0–36.0)
MCV: 94.9 fL (ref 80.0–100.0)
Monocytes Absolute: 0.7 K/uL (ref 0.1–1.0)
Monocytes Relative: 9 %
Neutro Abs: 3.4 K/uL (ref 1.7–7.7)
Neutrophils Relative %: 49 %
Platelet Count: 382 K/uL (ref 150–400)
RBC: 3.52 MIL/uL — ABNORMAL LOW (ref 4.22–5.81)
RDW: 12.4 % (ref 11.5–15.5)
WBC Count: 7.1 K/uL (ref 4.0–10.5)
nRBC: 0 % (ref 0.0–0.2)

## 2023-10-31 LAB — PSA: Prostatic Specific Antigen: 4.79 ng/mL — ABNORMAL HIGH (ref 0.00–4.00)

## 2023-10-31 MED ORDER — ALENDRONATE SODIUM 70 MG PO TABS: 70.0000 mg | ORAL_TABLET | ORAL | 2 refills | Status: DC

## 2023-10-31 NOTE — Progress Notes (Signed)
 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  At that time he had staging evaluation consistent with metastatic disease to the sacrum and pelvis and was started on Lupron  every 6 months.  Bone scan done on March 2019 again showed metastatic involvement of the right acetabulum and iliac wing and right sacrum.  Patient has been responding to Lupron  well and his PSA went down from 6.6 in 20 16-0.5 in 2019.  It went up to 1.2 on 08/13/2019.  Patient has therefore been referred to us  for consideration of additional treatments for prostate cancer patient is doing well for his age and remains independent of his ADLs.    He gets Lupron  through urology.  He has not required any additional antiandrogen treatment so far  Interval history-patient is doing well for his age.  No recent Hospitalizations.  He is here with his son today.  Remains independent of his ADLs.  ECOG PS- 2 Pain scale- 0   Review of systems- Review of Systems  Constitutional:  Positive for malaise/fatigue. Negative for chills, fever and weight loss.  HENT:  Negative for congestion, ear discharge and nosebleeds.   Eyes:  Negative for blurred vision.  Respiratory:  Negative for cough, hemoptysis, sputum production, shortness of breath and wheezing.   Cardiovascular:  Negative for  chest pain, palpitations, orthopnea and claudication.  Gastrointestinal:  Negative for abdominal pain, blood in stool, constipation, diarrhea, heartburn, melena, nausea and vomiting.  Genitourinary:  Negative for dysuria, flank pain, frequency, hematuria and urgency.  Musculoskeletal:  Negative for back pain, joint pain and myalgias.  Skin:  Negative for rash.  Neurological:  Negative for dizziness, tingling, focal weakness, seizures, weakness and headaches.  Endo/Heme/Allergies:  Does not bruise/bleed easily.  Psychiatric/Behavioral:  Negative for depression and suicidal ideas. The patient does not have insomnia.       Allergies  Allergen Reactions   Lisinopril      coughing  Other Reaction(s): Unknown  coughing coughing    coughing     Past Medical History:  Diagnosis Date   Diabetes mellitus without complication (HCC)    Hearing loss    Hx of fracture of wrist 07/20/2017   >30 years ago. <1989   Hyperlipidemia    Hypertension    Prostate cancer [facility])      Past Surgical History:  Procedure Laterality Date   CATARACT EXTRACTION W/PHACO Right 09/27/2017   Procedure: CATARACT EXTRACTION PHACO AND INTRAOCULAR LENS PLACEMENT (IOC) RIGHT;  Surgeon: Mittie Gaskin, MD;  Location: [facility];  Service: Ophthalmology;  Laterality: Right;  IVA TOPICAL DIABETES   CATARACT EXTRACTION W/PHACO Left 10/18/2017   Procedure: CATARACT EXTRACTION PHACO AND INTRAOCULAR LENS PLACEMENT (IOC)  LEFT DIABETIC;  Surgeon: Mittie Gaskin, MD;  Location: [facility];  Service: Ophthalmology;  Laterality: Left;  DIABETIC-ORAL MED   COLONOSCOPY  2011   normal- Dr?    Social History   Socioeconomic History  Marital status: Married    Spouse name: Not on file   Number of children: Not on file   Years of education: Not on file   Highest education level: Not on file  Occupational History   Not on file  Tobacco Use   Smoking status: Never   Smokeless tobacco: Never   Vaping Use   Vaping status: Never Used  Substance and Sexual Activity   Alcohol use: No    Alcohol/week: 0.0 standard drinks of alcohol   Drug use: No   Sexual activity: Yes  Other Topics Concern   Not on file  Social History Narrative   Pt lives with wife.    Social Drivers of Corporate investment banker Strain: Low Risk  (06/30/2023)   Received from [facility]   Overall Financial Resource Strain (CARDIA)    Difficulty of Paying Living Expenses: Not hard at all  Food Insecurity: No Food Insecurity (06/30/2023)   Received [facility]   Hunger Vital Sign    Within the past 12 months, you worried that your food would run out before you got the money to buy more.: Never true    Within the past 12 months, the food you bought just didn't last and you didn't have money to get more.: Never true  Transportation Needs: No Transportation Needs (06/30/2023)   Received from [facility] - Transportation    In the past 12 months, has lack of transportation kept you from medical appointments or from getting medications?: No    Lack of Transportation (Non-Medical): No  Physical Activity: Insufficiently Active (12/07/2022)   Exercise Vital Sign    Days of Exercise per Week: 3 days    Minutes of Exercise per Session: 30 min  Stress: No Stress Concern Present (12/07/2022)   [employer] of Occupational Health - Occupational Stress Questionnaire    Feeling of Stress : Not at all  Social Connections: Socially Integrated (05/01/2023)   Social Connection and Isolation Panel    Frequency of Communication with Friends and Family: More than three times a week    Frequency of Social Gatherings with Friends and Family: More than three times a week    Attends Religious Services: More than 4 times per year    Active Member of [employer] or Organizations: Yes    Attends Engineer, structural: More than 4 times per year    Marital  Status: Married  Catering manager Violence: Not At Risk (05/01/2023)   Humiliation, Afraid, Rape, and Kick questionnaire    Fear of Current or Ex-Partner: No    Emotionally Abused: No    Physically Abused: No    Sexually Abused: No    Family History  Family history unknown: Yes     Current Outpatient Medications:    aspirin  81 MG tablet, Take 81 mg by mouth daily., Disp: , Rfl:    carvedilol  (COREG ) 3.125 MG tablet, Take 1 tablet (3.125 mg total) by mouth 2 (two) times daily with a meal., Disp: 180 tablet, Rfl: 0   dapagliflozin  propanediol (FARXIGA ) 5 MG TABS tablet, Take 1 tablet (5 mg total) by mouth daily., Disp: 90 tablet, Rfl: 0   donepezil  (ARICEPT ) 5 MG tablet, Take 1 tablet (5 mg total) by mouth at bedtime., Disp: 90 tablet, Rfl: 0   furosemide  (LASIX ) 40 MG tablet, Take 40 mg by mouth., Disp: , Rfl:    gabapentin  (NEURONTIN ) 100 MG capsule, Take 1  capsule (100 mg total) by mouth at bedtime., Disp: 30 capsule, Rfl: 5   GE100 BLOOD GLUCOSE TEST test strip, CHECK BLOOD SUGAR ONCE DAILY OR AS DIRECTED, Disp: 50 strip, Rfl: 0   Lancets (FREESTYLE) lancets, AS DIRECTED, Disp: 100 each, Rfl: 0   mirtazapine (REMERON) 15 MG tablet, Take 15 mg by mouth., Disp: , Rfl:    simvastatin  (ZOCOR ) 20 MG tablet, Take 1 tablet (20 mg total) by mouth every morning., Disp: 90 tablet, Rfl: 0   spironolactone  (ALDACTONE ) 25 MG tablet, Take 0.5 tablets (12.5 mg total) by mouth daily., Disp: 45 tablet, Rfl: 0   valsartan  (DIOVAN ) 40 MG tablet, Take 1 tablet (40 mg total) by mouth daily., Disp: 90 tablet, Rfl: 1  Physical exam: There were no vitals filed for this visit. Physical Exam Constitutional:      Comments: Ambulates with a cane.  Appears in no acute distress  Cardiovascular:     Rate and Rhythm: Normal rate and regular rhythm.     Heart sounds: Normal heart sounds.  Pulmonary:     Effort: Pulmonary effort is normal.     Breath sounds: Normal breath sounds.  Abdominal:     General: Bowel  sounds are normal.     Palpations: Abdomen is soft.  Skin:    General: Skin is warm and dry.  Neurological:     Mental Status: He is alert and oriented to person, place, and time.      I have personally reviewed labs listed below:    Latest Ref Rng & Units 08/23/2023   11:24 AM  CMP  Glucose 70 - 99 mg/dL 876   BUN 8 - 23 mg/dL 23   Creatinine 9.38 - 1.24 mg/dL 8.53   Sodium 864 - 854 mmol/L 136   Potassium 3.5 - 5.1 mmol/L 4.2   Chloride 98 - 111 mmol/L 103   CO2 22 - 32 mmol/L 24   Calcium 8.9 - 10.3 mg/dL 8.8   Total Protein 6.5 - 8.1 g/dL 7.2   Total Bilirubin 0.0 - 1.2 mg/dL 0.9   Alkaline Phos 38 - 126 U/L 91   AST 15 - 41 U/L 16   ALT 0 - 44 U/L 10       Latest Ref Rng & Units 08/23/2023   11:24 AM  CBC  WBC 4.0 - 10.5 K/uL 4.9   Hemoglobin 13.0 - 17.0 g/dL 88.2   Hematocrit 60.9 - 52.0 % 37.4   Platelets 150 - 400 K/uL 284    I have personally reviewed Radiology images listed below: No images are attached to the encounter.  DG Bone Density Result Date: 10/05/2023 EXAM: DUAL X-RAY ABSORPTIOMETRY (DXA) FOR BONE MINERAL DENSITY 10/05/2023 1:29 pm CLINICAL DATA:  89 year old Male Prostate cancer with meds, high risk meds, needs bone density History of fragility fracture. TECHNIQUE: An axial (e.g., hips, spine) and/or appendicular (e.g., radius) exam was performed, as appropriate, using GE Secretary/administrator at [facility]. Images are obtained for bone mineral density measurement and are not obtained for diagnostic purposes. [id number] Exclusions: Lumbar spine due to advanced degenerative changes. COMPARISON:  None. FINDINGS: Scan quality: Good. LEFT FEMORAL NECK: BMD (in g/cm2): 0.858 T-score: -1.6 Z-score: -0.7 LEFT TOTAL HIP: BMD (in g/cm2): 0.932 T-score: -1.2 Z-score: -0.3 RIGHT FEMORAL NECK: BMD (in g/cm2): 0.735 T-score: -2.6 Z-score: -1.7 RIGHT TOTAL HIP: BMD (in g/cm2): 0.861 T-score: -1.7 Z-score: -0.8 LEFT FOREARM (RADIUS 33%): BMD (in  g/cm2): 0.694 T-score: -3.0 Z-score: -  1.9 FRAX 10-YEAR PROBABILITY OF FRACTURE: FRAX not reported as the lowest BMD is not in the osteopenia range. IMPRESSION: Osteoporosis based on BMD. Fracture risk is increased. Increased risk is based on low BMD and history of fragility fracture. RECOMMENDATIONS: 1. All patients should optimize calcium and vitamin D intake. 2. Consider FDA-approved medical therapies in postmenopausal women and men aged 16 years and older, based on the following: - A hip or vertebral (clinical or morphometric) fracture - T-score less than or equal to -2.5 and secondary causes have been excluded. - Low bone mass (T-score between -1.0 and -2.5) and a 10-year probability of a hip fracture greater than or equal to 3% or a 10-year probability of a major osteoporosis-related fracture greater than or equal to 20% based on the [place] -adapted WHO algorithm. - Clinician judgment and/or patient preferences may indicate treatment for people with 10-year fracture probabilities above or below these levels 3. Patients with diagnosis of osteoporosis or at high risk for fracture should have regular bone mineral density tests. For patients eligible for Medicare, routine testing is allowed once every 2 years. The testing frequency can be increased to one year for patients who have rapidly progressing disease, those who are receiving or discontinuing medical therapy to restore bone mass, or have additional risk factors. Electronically Signed   By: Alm Parkins M.D.   On: 10/05/2023 16:13     Assessment and plan- Patient is a 89 y.o. male with history of castrate resistant metastatic prostate cancer with bone only metastases currently on Lupron  here for routine follow-up  Patient last received Lupron  through urology on 09/05/2023.  PSA from today is pending.  Patient has been on Lupron  alone since October 2023.  PSA doubling time remains more than 10 months.  Given his Age have not started him on any additional ADT  at this time.  I discussed the results of bone density scan with the patient which shows a T-score of - 3 in his left forearm consistent with osteoporosis.  This can potentially worsen with his ongoing ADT.  Discussed options for oral Fosamax  once a week versus Prolia every 6 months.  Discussed possible side effects of  Fosamax  including all but not limited to nausea, dyspepsia.  Discussed possible side effects of Prolia including all but not limited to possible risk of osteonecrosis of the jaw.  Patient would like to try Fosamax  to begin with.  CBC with differential CMP PSA today and in 6 months and I will see him back in 6 months   Visit Diagnosis 1. Primary prostate adenocarcinoma (HCC)   2. Encounter for follow-up surveillance of prostate cancer   3. Osteoporosis of forearm      Dr. Annah Skene, MD, MPH [facility] [phone] 10/31/2023 12:50 PM

## 2023-10-31 NOTE — Progress Notes (Signed)
 Patient has no concerns

## 2023-11-22 ENCOUNTER — Other Ambulatory Visit

## 2023-12-13 ENCOUNTER — Ambulatory Visit

## 2024-02-04 ENCOUNTER — Other Ambulatory Visit: Payer: Self-pay | Admitting: Oncology

## 2024-02-05 ENCOUNTER — Encounter: Payer: Self-pay | Admitting: Internal Medicine

## 2024-02-09 ENCOUNTER — Ambulatory Visit: Admitting: Physician Assistant

## 2024-02-23 ENCOUNTER — Other Ambulatory Visit

## 2024-02-23 ENCOUNTER — Ambulatory Visit: Admitting: Oncology

## 2024-03-01 ENCOUNTER — Ambulatory Visit: Admitting: Physician Assistant

## 2024-03-04 ENCOUNTER — Ambulatory Visit (INDEPENDENT_AMBULATORY_CARE_PROVIDER_SITE_OTHER): Admitting: Physician Assistant

## 2024-03-04 VITALS — BP 124/69 | HR 76 | Ht 74.0 in | Wt 198.0 lb

## 2024-03-04 DIAGNOSIS — C61 Malignant neoplasm of prostate: Secondary | ICD-10-CM

## 2024-03-04 MED ORDER — LEUPROLIDE ACETATE (6 MONTH) 45 MG ~~LOC~~ KIT
45.0000 mg | PACK | Freq: Once | SUBCUTANEOUS | Status: AC
  Administered 2024-03-04: 45 mg via SUBCUTANEOUS

## 2024-03-07 NOTE — Progress Notes (Signed)
 03/04/2024 2:11 PM   Brandon Dillon. 01-Apr-1928 [id number]  CC: Chief Complaint  Patient presents with   Prostate Cancer   HPI: Brandon Dillon. is a 89 y.o. male with PMH castrate resistant metastatic prostate cancer on Eligard  followed by Drs. Stoioff and Melanee who presents today for follow-up and Eligard .   Today he reports no acute concerns.  He remains on calcium and vitamin D supplements and tolerates his ADT without concerning side effects.  He has no questions today.  Most recent PSA was 4.79 back in July.  PMH: Past Medical History:  Diagnosis Date   Diabetes mellitus without complication (HCC)    Hearing loss    Hx of fracture of wrist 07/20/2017   >30 years ago. <1989   Hyperlipidemia    Hypertension    Prostate cancer [facility])     Surgical History: Past Surgical History:  Procedure Laterality Date   CATARACT EXTRACTION W/PHACO Right 09/27/2017   Procedure: CATARACT EXTRACTION PHACO AND INTRAOCULAR LENS PLACEMENT (IOC) RIGHT;  Surgeon: Mittie Gaskin, MD;  Location: [facility];  Service: Ophthalmology;  Laterality: Right;  IVA TOPICAL DIABETES   CATARACT EXTRACTION W/PHACO Left 10/18/2017   Procedure: CATARACT EXTRACTION PHACO AND INTRAOCULAR LENS PLACEMENT (IOC)  LEFT DIABETIC;  Surgeon: Mittie Gaskin, MD;  Location: [facility];  Service: Ophthalmology;  Laterality: Left;  DIABETIC-ORAL MED   COLONOSCOPY  2011   normal- Dr?    Home Medications:  Allergies as of 03/04/2024       Reactions   Lisinopril     coughing Other Reaction(s): Unknown coughing coughing    coughing        Medication List        Accurate as of March 04, 2024 11:59 PM. If you have any questions, ask your nurse or doctor.          alendronate  70 MG tablet Commonly known as: FOSAMAX  Take 1 tablet (70 mg total) by mouth once a week. Take with a full glass of water on an empty stomach.   aspirin  81 MG tablet Take 81 mg by mouth daily.    carvedilol  3.125 MG tablet Commonly known as: COREG  Take 1 tablet (3.125 mg total) by mouth 2 (two) times daily with a meal.   donepezil  5 MG tablet Commonly known as: ARICEPT  Take 1 tablet (5 mg total) by mouth at bedtime.   Farxiga  5 MG Tabs tablet Generic drug: dapagliflozin  propanediol Take 1 tablet (5 mg total) by mouth daily. What changed: how much to take   freestyle lancets AS DIRECTED   furosemide  40 MG tablet Commonly known as: LASIX  Take 40 mg by mouth.   gabapentin  100 MG capsule Commonly known as: NEURONTIN  Take 1 capsule (100 mg total) by mouth at bedtime.   GE100 Blood Glucose Test test strip Generic drug: glucose blood CHECK BLOOD SUGAR ONCE DAILY OR AS DIRECTED   mirtazapine 15 MG tablet Commonly known as: REMERON Take 15 mg by mouth.   simvastatin  20 MG tablet Commonly known as: ZOCOR  Take 1 tablet (20 mg total) by mouth every morning.   spironolactone  25 MG tablet Commonly known as: ALDACTONE  Take 0.5 tablets (12.5 mg total) by mouth daily.   valsartan  40 MG tablet Commonly known as: DIOVAN  Take 1 tablet (40 mg total) by mouth daily.   Vitamin D3 10 MCG (400 UNIT) tablet Take 400 Units by mouth.        Allergies:  Allergies  Allergen Reactions   Lisinopril   coughing  Other Reaction(s): Unknown  coughing coughing    coughing    Family History: Family History  Family history unknown: Yes    Social History:   reports that he has never smoked. He has never used smokeless tobacco. He reports that he does not drink alcohol and does not use drugs.  Physical Exam: BP 124/69   Pulse 76   Ht 6' 2 (1.88 m)   Wt 198 lb (89.8 kg)   BMI 25.42 kg/m   Constitutional:  Alert and oriented, no acute distress, nontoxic appearing HEENT: [place], AT Cardiovascular: No clubbing, cyanosis, or edema Respiratory: Normal respiratory effort, no increased work of breathing Skin: No rashes, bruises or suspicious lesions Neurologic: Grossly  intact, no focal deficits, moving all 4 extremities Psychiatric: Normal mood and affect  Laboratory Data: Results for orders placed or performed in visit on 10/31/23  PSA   Collection Time: 10/31/23  2:47 PM  Result Value Ref Range   Prostatic Specific Antigen 4.79 (H) 0.00 - 4.00 ng/mL   Assessment & Plan:   1. Prostate cancer (HCC) (Primary) No acute concerns today.  Will continue indefinite ADT with ongoing follow-up in our office and with Dr. Melanee.  91-month ADT administered today, see separate note. - leuprolide  (6 Month) (ELIGARD ) injection 45 mg   Return in about 6 months (around 09/01/2024) for Eligard .  Lucie Hones, PA-C  [facility] [place] [address], [address] [place], [place] [address] [phone]

## 2024-03-11 NOTE — Progress Notes (Addendum)
 Eligard  SubQ Injection   Due to Prostate Cancer patient is present today for a Eligard  Injection.  Medication: Eligard  6 month Dose: 45 mg  Location: right  Lot: [id number] Exp: 01/22/2025  Patient tolerated well, no complications were noted  Performed by: Mathew Pinal, RN  Per Dr. Twylla patient is to continue therapy indefinitely. Patient's next follow up was scheduled for 08/22/2024. This appointment was scheduled using wheel and given to patient today along with reminder continue on Vitamin D 800-1000iu and Calcium 1000-1200mg  daily while on Androgen Deprivation Therapy.  PA approval dates:  08/30/23 - 08/24/24

## 2024-04-08 ENCOUNTER — Other Ambulatory Visit: Payer: Self-pay | Admitting: Oncology

## 2024-04-09 ENCOUNTER — Encounter: Payer: Self-pay | Admitting: Internal Medicine

## 2024-05-07 ENCOUNTER — Other Ambulatory Visit: Payer: Self-pay | Admitting: Oncology

## 2024-05-07 DIAGNOSIS — C61 Malignant neoplasm of prostate: Secondary | ICD-10-CM

## 2024-05-08 ENCOUNTER — Encounter: Payer: Self-pay | Admitting: Nurse Practitioner

## 2024-05-08 ENCOUNTER — Inpatient Hospital Stay: Admitting: Nurse Practitioner

## 2024-05-08 ENCOUNTER — Inpatient Hospital Stay: Attending: Oncology

## 2024-05-08 VITALS — BP 135/70 | HR 73 | Temp 97.6°F | Resp 16 | Wt 222.0 lb

## 2024-05-08 DIAGNOSIS — C61 Malignant neoplasm of prostate: Secondary | ICD-10-CM

## 2024-05-08 DIAGNOSIS — C7951 Secondary malignant neoplasm of bone: Secondary | ICD-10-CM

## 2024-05-08 DIAGNOSIS — Z5181 Encounter for therapeutic drug level monitoring: Secondary | ICD-10-CM

## 2024-05-08 LAB — CBC WITH DIFFERENTIAL (CANCER CENTER ONLY)
Abs Immature Granulocytes: 0.04 K/uL (ref 0.00–0.07)
Basophils Absolute: 0.1 K/uL (ref 0.0–0.1)
Basophils Relative: 1 %
Eosinophils Absolute: 0.2 K/uL (ref 0.0–0.5)
Eosinophils Relative: 3 %
HCT: 40.3 % (ref 39.0–52.0)
Hemoglobin: 12.8 g/dL — ABNORMAL LOW (ref 13.0–17.0)
Immature Granulocytes: 1 %
Lymphocytes Relative: 39 %
Lymphs Abs: 2.6 K/uL (ref 0.7–4.0)
MCH: 30.7 pg (ref 26.0–34.0)
MCHC: 31.8 g/dL (ref 30.0–36.0)
MCV: 96.6 fL (ref 80.0–100.0)
Monocytes Absolute: 0.6 K/uL (ref 0.1–1.0)
Monocytes Relative: 10 %
Neutro Abs: 3.1 K/uL (ref 1.7–7.7)
Neutrophils Relative %: 46 %
Platelet Count: 281 K/uL (ref 150–400)
RBC: 4.17 MIL/uL — ABNORMAL LOW (ref 4.22–5.81)
RDW: 12.5 % (ref 11.5–15.5)
WBC Count: 6.7 K/uL (ref 4.0–10.5)
nRBC: 0 % (ref 0.0–0.2)

## 2024-05-08 LAB — CMP (CANCER CENTER ONLY)
ALT: 7 U/L (ref 0–44)
AST: 14 U/L — ABNORMAL LOW (ref 15–41)
Albumin: 4.4 g/dL (ref 3.5–5.0)
Alkaline Phosphatase: 82 U/L (ref 38–126)
Anion gap: 10 (ref 5–15)
BUN: 24 mg/dL — ABNORMAL HIGH (ref 8–23)
CO2: 26 mmol/L (ref 22–32)
Calcium: 9.2 mg/dL (ref 8.9–10.3)
Chloride: 107 mmol/L (ref 98–111)
Creatinine: 1.53 mg/dL — ABNORMAL HIGH (ref 0.61–1.24)
GFR, Estimated: 42 mL/min — ABNORMAL LOW
Glucose, Bld: 158 mg/dL — ABNORMAL HIGH (ref 70–99)
Potassium: 4.6 mmol/L (ref 3.5–5.1)
Sodium: 143 mmol/L (ref 135–145)
Total Bilirubin: 0.5 mg/dL (ref 0.0–1.2)
Total Protein: 7.7 g/dL (ref 6.5–8.1)

## 2024-05-08 LAB — PSA: Prostatic Specific Antigen: 8.12 ng/mL — ABNORMAL HIGH (ref 0.00–4.00)

## 2024-05-08 NOTE — Progress Notes (Signed)
 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  At that time he had staging evaluation consistent with metastatic disease to the sacrum and pelvis and was started on Lupron  every 6 months.  Bone scan done on March 2019 again showed metastatic involvement of the right acetabulum and iliac wing and right sacrum.  Patient has been responding to Lupron  well and his PSA went down from 6.6 in 20 16-0.5 in 2019.  It went up to 1.2 on 08/13/2019.  Patient has therefore been referred to us  for consideration of additional treatments for prostate cancer patient is doing well for his age and remains independent of his ADLs.    He gets Lupron  through urology.  His next appointment with urology for lupron  is in May.  He has not required any additional antiandrogen treatment so far  Interval history-patient is doing well for his age.  No recent Hospitalizations.  Remains independent of his ADLs.  ECOG PS- 2 Pain scale- 0   Review of systems- Review of Systems  Constitutional:  Positive for malaise/fatigue. Negative for chills, fever and weight loss.  HENT:  Negative for congestion, ear discharge and nosebleeds.   Eyes:  Negative for blurred vision.  Respiratory:  Negative for cough, hemoptysis, sputum production,  shortness of breath and wheezing.   Cardiovascular:  Negative for chest pain, palpitations, orthopnea and claudication.  Gastrointestinal:  Negative for abdominal pain, blood in stool, constipation, diarrhea, heartburn, melena, nausea and vomiting.  Genitourinary:  Negative for dysuria, flank pain, frequency, hematuria and urgency.  Musculoskeletal:  Negative for back pain, joint pain and myalgias.  Skin:  Negative for rash.  Neurological:  Negative for dizziness, tingling, focal weakness, seizures, weakness and headaches.  Endo/Heme/Allergies:  Does not bruise/bleed easily.  Psychiatric/Behavioral:  Negative for depression and suicidal ideas. The patient does not have insomnia.       Allergies  Allergen Reactions   Lisinopril      coughing  Other Reaction(s): Unknown  coughing coughing    coughing     Past Medical History:  Diagnosis Date   Diabetes mellitus without complication (HCC)    Hearing loss    Hx of fracture of wrist 07/20/2017   >30 years ago. <1989   Hyperlipidemia    Hypertension    Prostate cancer [facility])      Past Surgical History:  Procedure Laterality Date   CATARACT EXTRACTION W/PHACO Right 09/27/2017   Procedure: CATARACT EXTRACTION PHACO AND INTRAOCULAR LENS PLACEMENT (IOC) RIGHT;  Surgeon: Mittie Gaskin, MD;  Location: [facility];  Service: Ophthalmology;  Laterality: Right;  IVA TOPICAL DIABETES   CATARACT EXTRACTION W/PHACO Left 10/18/2017   Procedure: CATARACT EXTRACTION PHACO AND INTRAOCULAR LENS PLACEMENT (IOC)  LEFT DIABETIC;  Surgeon: Mittie Gaskin, MD;  Location: [facility];  Service: Ophthalmology;  Laterality: Left;  DIABETIC-ORAL MED   COLONOSCOPY  2011   normal- Dr?  Social History   Socioeconomic History   Marital status: Married    Spouse name: Not on file   Number of children: Not on file   Years of education: Not on file   Highest education level: Not on file  Occupational History   Not on file   Tobacco Use   Smoking status: Never   Smokeless tobacco: Never  Vaping Use   Vaping status: Never Used  Substance and Sexual Activity   Alcohol use: No    Alcohol/week: 0.0 standard drinks of alcohol   Drug use: No   Sexual activity: Yes  Other Topics Concern   Not on file  Social History Narrative   Pt lives with wife.    Social Drivers of Health   Tobacco Use: Low Risk (05/08/2024)   Patient History    Smoking Tobacco Use: Never    Smokeless Tobacco Use: Never    Passive Exposure: Not on file  Financial Resource Strain: Low Risk  (03/14/2024)   Received [facility]   Overall Financial Resource Strain (CARDIA)    Difficulty of Paying Living Expenses: Not hard at all  Food Insecurity: No Food Insecurity (03/14/2024)   Received from [facility]   Epic    Within the past 12 months, you worried that your food would run out before you got the money to buy more.: Never true    Within the past 12 months, the food you bought just didn't last and you didn't have money to get more.: Never true  Transportation Needs: No Transportation Needs (03/14/2024)   Received from [facility] - Transportation    In the past 12 months, has lack of transportation kept you from medical appointments or from getting medications?: No    Lack of Transportation (Non-Medical): No  Physical Activity: Insufficiently Active (12/07/2022)   Exercise Vital Sign    Days of Exercise per Week: 3 days    Minutes of Exercise per Session: 30 min  Stress: No Stress Concern Present (12/07/2022)   [employer] of Occupational [employer] - Occupational Stress Questionnaire    Feeling of Stress : Not at all  Social Connections: Socially Integrated (05/01/2023)   Social Connection and Isolation Panel    Frequency of Communication with Friends and Family: More than three times a week    Frequency of Social Gatherings with Friends and Family: More than  three times a week    Attends Religious Services: More than 4 times per year    Active Member of [employer] or Organizations: Yes    Attends Engineer, Structural: More than 4 times per year    Marital Status: Married  Catering Manager Violence: Not At Risk (05/01/2023)   Humiliation, Afraid, Rape, and Kick questionnaire    Fear of Current or Ex-Partner: No    Emotionally Abused: No    Physically Abused: No    Sexually Abused: No  Depression (PHQ2-9): Low Risk (05/08/2024)   Depression (PHQ2-9)    PHQ-2 Score: 0  Alcohol Screen: Low Risk (12/07/2022)   Alcohol Screen    Last Alcohol Screening Score (AUDIT): 0  Housing: Low Risk  (03/14/2024)   Received from [facility]   Epic    In the last 12 months, was there a time when you were not able to pay the mortgage or rent on time?: No    In the past 12 months, how many times have you moved where  you were living?: 0    At any time in the past 12 months, were you homeless or living in a shelter (including now)?: No  Utilities: Not At Risk (03/14/2024)   Received from [facility]   Epic    In the past 12 months has the electric, gas, oil, or water company threatened to shut off services in your home?: No  Health Literacy: Not on file    Family History  Family history unknown: Yes     Current Outpatient Medications:    alendronate  (FOSAMAX ) 70 MG tablet, Take 1 tablet (70 mg total) by mouth once a week. Take with a full glass of water on an empty stomach., Disp: 4 tablet, Rfl: 0   aspirin  81 MG tablet, Take 81 mg by mouth daily., Disp: , Rfl:    carvedilol  (COREG ) 3.125 MG tablet, Take 1 tablet (3.125 mg total) by mouth 2 (two) times daily with a meal., Disp: 180 tablet, Rfl: 0   Cholecalciferol (VITAMIN D3) 10 MCG (400 UNIT) tablet, Take 400 Units by mouth., Disp: , Rfl:    dapagliflozin  propanediol (FARXIGA ) 5 MG TABS tablet, Take 1 tablet (5 mg total) by mouth daily. (Patient taking differently:  Take 10 mg by mouth daily.), Disp: 90 tablet, Rfl: 0   donepezil  (ARICEPT ) 5 MG tablet, Take 1 tablet (5 mg total) by mouth at bedtime., Disp: 90 tablet, Rfl: 0   furosemide  (LASIX ) 40 MG tablet, Take 40 mg by mouth., Disp: , Rfl:    gabapentin  (NEURONTIN ) 100 MG capsule, Take 1 capsule (100 mg total) by mouth at bedtime., Disp: 30 capsule, Rfl: 5   GE100 BLOOD GLUCOSE TEST test strip, CHECK BLOOD SUGAR ONCE DAILY OR AS DIRECTED, Disp: 50 strip, Rfl: 0   Lancets (FREESTYLE) lancets, AS DIRECTED, Disp: 100 each, Rfl: 0   mirtazapine (REMERON) 15 MG tablet, Take 15 mg by mouth., Disp: , Rfl:    simvastatin  (ZOCOR ) 20 MG tablet, Take 1 tablet (20 mg total) by mouth every morning., Disp: 90 tablet, Rfl: 0   spironolactone  (ALDACTONE ) 25 MG tablet, Take 0.5 tablets (12.5 mg total) by mouth daily., Disp: 45 tablet, Rfl: 0   valsartan  (DIOVAN ) 40 MG tablet, Take 1 tablet (40 mg total) by mouth daily., Disp: 90 tablet, Rfl: 1  Physical exam:  Vitals:   05/08/24 1103  BP: 135/70  Pulse: 73  Resp: 16  Temp: 97.6 F (36.4 C)  TempSrc: Tympanic  SpO2: 100%  Weight: 222 lb (100.7 kg)   Physical Exam Constitutional:      Comments: Ambulates with a cane.  Appears in no acute distress  Cardiovascular:     Rate and Rhythm: Normal rate and regular rhythm.     Heart sounds: Normal heart sounds.  Pulmonary:     Effort: Pulmonary effort is normal.     Breath sounds: Normal breath sounds.  Abdominal:     General: Bowel sounds are normal.     Palpations: Abdomen is soft.  Skin:    General: Skin is warm and dry.  Neurological:     Mental Status: He is alert and oriented to person, place, and time.      I have personally reviewed labs listed below:    Latest Ref Rng & Units 05/08/2024   10:50 AM  CMP  Glucose 70 - 99 mg/dL 841   BUN 8 - 23 mg/dL 24   Creatinine 9.38 - 1.24 mg/dL 8.46   Sodium 864 - 854 mmol/L 143  Potassium 3.5 - 5.1 mmol/L 4.6   Chloride 98 - 111 mmol/L 107   CO2 22 - 32  mmol/L 26   Calcium 8.9 - 10.3 mg/dL 9.2   Total Protein 6.5 - 8.1 g/dL 7.7   Total Bilirubin 0.0 - 1.2 mg/dL 0.5   Alkaline Phos 38 - 126 U/L 82   AST 15 - 41 U/L 14   ALT 0 - 44 U/L 7       Latest Ref Rng & Units 05/08/2024   10:50 AM  CBC  WBC 4.0 - 10.5 K/uL 6.7   Hemoglobin 13.0 - 17.0 g/dL 87.1   Hematocrit 60.9 - 52.0 % 40.3   Platelets 150 - 400 K/uL 281    I have personally reviewed Radiology images listed below: No images are attached to the encounter.  No results found.    Assessment and plan- Patient is a 89 y.o. male with history of castrate resistant metastatic prostate cancer with bone only metastases currently on Lupron  here for routine follow-up  Patient last received Lupron  through urology on 03/04/2024.  PSA from today is pending.  Patient has been on Lupron  alone since October 2023.  PSA doubling time remains more than 10 months.  Given his Age have not started him on any additional ADT at this time.  I discussed the results of bone density scan with the patient which shows a T-score of - 3 in his left forearm consistent with osteoporosis.  This can potentially worsen with his ongoing ADT.  Patient currently taking fosamax  and tolerating it well. Last bone density scan completed 09/2023.  No changes to plan of care today will have patient return to see Dr. Melanee with repeat labs in 6 months.     Visit Diagnosis No diagnosis found.  Follow up plan: F/U see Dr. Melanee with cbc with diff, cmp, and psa in 6 months LP   Morna Husband AGNP-C [facility] [phone] 05/08/2024 4:02 PM                "

## 2024-05-09 ENCOUNTER — Ambulatory Visit: Payer: Self-pay | Admitting: Oncology

## 2024-05-09 DIAGNOSIS — C61 Malignant neoplasm of prostate: Secondary | ICD-10-CM

## 2024-05-09 NOTE — Telephone Encounter (Signed)
 Per Dr. Melanee Patient saw Brandon Dillon yesterday. His psa is going up. He needs a psma pet scan in the next 2-3 weeks and see me thereafter no labs. PSMA Pet order entered.

## 2024-05-20 ENCOUNTER — Ambulatory Visit

## 2024-05-24 ENCOUNTER — Ambulatory Visit

## 2024-05-27 ENCOUNTER — Ambulatory Visit

## 2024-06-03 ENCOUNTER — Other Ambulatory Visit

## 2024-06-03 ENCOUNTER — Inpatient Hospital Stay: Admitting: Oncology

## 2024-06-05 ENCOUNTER — Ambulatory Visit

## 2024-06-11 ENCOUNTER — Inpatient Hospital Stay: Admitting: Oncology

## 2024-08-22 ENCOUNTER — Ambulatory Visit: Admitting: Physician Assistant

## 2024-11-12 ENCOUNTER — Inpatient Hospital Stay

## 2024-11-12 ENCOUNTER — Inpatient Hospital Stay: Admitting: Oncology
